# Patient Record
Sex: Male | Born: 1950
Health system: Southern US, Community
[De-identification: ages and names within clinical notes are randomized; demographics above are authoritative.]

## PROBLEM LIST (undated history)

## (undated) DIAGNOSIS — J189 Pneumonia, unspecified organism: Secondary | ICD-10-CM

## (undated) DIAGNOSIS — K219 Gastro-esophageal reflux disease without esophagitis: Secondary | ICD-10-CM

## (undated) DIAGNOSIS — J439 Emphysema, unspecified: Secondary | ICD-10-CM

## (undated) DIAGNOSIS — D369 Benign neoplasm, unspecified site: Secondary | ICD-10-CM

## (undated) DIAGNOSIS — J449 Chronic obstructive pulmonary disease, unspecified: Secondary | ICD-10-CM

## (undated) DIAGNOSIS — I10 Essential (primary) hypertension: Secondary | ICD-10-CM

## (undated) DIAGNOSIS — R918 Other nonspecific abnormal finding of lung field: Secondary | ICD-10-CM

## (undated) DIAGNOSIS — Z720 Tobacco use: Secondary | ICD-10-CM

## (undated) DIAGNOSIS — R9431 Abnormal electrocardiogram [ECG] [EKG]: Secondary | ICD-10-CM

## (undated) DIAGNOSIS — E785 Hyperlipidemia, unspecified: Secondary | ICD-10-CM

## (undated) DIAGNOSIS — R0789 Other chest pain: Secondary | ICD-10-CM

## (undated) DIAGNOSIS — J209 Acute bronchitis, unspecified: Secondary | ICD-10-CM

## (undated) DIAGNOSIS — K429 Umbilical hernia without obstruction or gangrene: Secondary | ICD-10-CM

## (undated) DIAGNOSIS — D72829 Elevated white blood cell count, unspecified: Secondary | ICD-10-CM

## (undated) DIAGNOSIS — E119 Type 2 diabetes mellitus without complications: Secondary | ICD-10-CM

## (undated) DIAGNOSIS — A419 Sepsis, unspecified organism: Secondary | ICD-10-CM

## (undated) HISTORY — DX: Sepsis, unspecified organism: A41.9

## (undated) HISTORY — DX: Elevated white blood cell count, unspecified: D72.829

## (undated) HISTORY — PX: HERNIA REPAIR: SHX51

## (undated) HISTORY — DX: Chronic obstructive pulmonary disease, unspecified: J44.9

## (undated) HISTORY — DX: Hyperlipidemia, unspecified: E78.5

## (undated) HISTORY — DX: Emphysema, unspecified: J43.9

## (undated) HISTORY — PX: OTHER SURGICAL HISTORY: SHX169

## (undated) HISTORY — DX: Tobacco use: Z72.0

## (undated) HISTORY — DX: Other nonspecific abnormal finding of lung field: R91.8

## (undated) HISTORY — DX: Acute bronchitis, unspecified: J20.9

## (undated) HISTORY — PX: APPENDECTOMY: SHX54

## (undated) HISTORY — PX: TONSILLECTOMY: SUR1361

## (undated) HISTORY — PX: HEMORRHOIDECTOMY WITH HEMORRHOID BANDING: SHX5633

## (undated) HISTORY — DX: Essential (primary) hypertension: I10

## (undated) HISTORY — DX: Other chest pain: R07.89

## (undated) HISTORY — DX: Gastro-esophageal reflux disease without esophagitis: K21.9

## (undated) HISTORY — DX: Abnormal electrocardiogram (ECG) (EKG): R94.31

## (undated) HISTORY — DX: Benign neoplasm, unspecified site: D36.9

## (undated) HISTORY — DX: Type 2 diabetes mellitus without complications: E11.9

## (undated) HISTORY — DX: Umbilical hernia without obstruction or gangrene: K42.9

---

## 2014-01-18 ENCOUNTER — Encounter: Payer: Self-pay | Admitting: Emergency Medicine

## 2014-01-18 ENCOUNTER — Ambulatory Visit (INDEPENDENT_AMBULATORY_CARE_PROVIDER_SITE_OTHER): Payer: BC Managed Care – PPO | Admitting: Emergency Medicine

## 2014-01-18 VITALS — BP 132/84 | HR 104 | Temp 97.1°F | Ht 73.0 in | Wt 317.0 lb

## 2014-01-18 DIAGNOSIS — R911 Solitary pulmonary nodule: Secondary | ICD-10-CM

## 2014-01-18 DIAGNOSIS — F17200 Nicotine dependence, unspecified, uncomplicated: Secondary | ICD-10-CM | POA: Insufficient documentation

## 2014-01-18 NOTE — Assessment & Plan Note (Signed)
64mm calcified nodule. No further eval indicated for this even in a smoker, but I do believe he needs to be screened given his tobacco use. Will order dedicated CT chest

## 2014-01-18 NOTE — Patient Instructions (Signed)
We will perform full pulmonary function testing We will perform a CT scan of your chest  You have agreed to cut your cigarettes down to 1/2 a pack a day by next visit.  Follow with Dr Lamonte Sakai in 1 month

## 2014-01-18 NOTE — Progress Notes (Signed)
   Subjective:    Patient ID: Manuel Leonard, male    DOB: Leonard 05, 1952, 63 y.o.   MRN: [id number]  HPI 63 yo smoker (40 pk-yrs), hx HTN, GERD, COPD, renal cysts and hematuria. Was discovered to have a 2-66mm LLL nodule, probably calcified.   He has cough, happens daily. He has GERD that is only moderately controlled Able to work and exert. He does not use symbicort regularly.    Review of Systems  Constitutional: Negative for fever and unexpected weight change.  HENT: Positive for sneezing and sore throat. Negative for congestion, dental problem, ear pain, nosebleeds, postnasal drip, rhinorrhea, sinus pressure and trouble swallowing.   Eyes: Negative for redness and itching.  Respiratory: Positive for cough. Negative for chest tightness, shortness of breath and wheezing.   Cardiovascular: Positive for chest pain. Negative for palpitations and leg swelling.  Gastrointestinal: Negative for nausea and vomiting.  Genitourinary: Negative for dysuria.  Musculoskeletal: Negative for joint swelling.  Skin: Positive for rash.  Neurological: Negative for headaches.  Hematological: Does not bruise/bleed easily.  Psychiatric/Behavioral: Negative for dysphoric mood. The patient is nervous/anxious.    Past Medical History  Diagnosis Date  . Emphysema/COPD      Family History  Problem Relation Age of Onset  . Lung cancer Brother   . Stomach cancer Brother   . Cancer Father      History   Social History  . Marital Status: Married    Spouse Name: N/A    Number of Children: N/A  . Years of Education: N/A   Occupational History  . Restaurant owner    Social History Main Topics  . Smoking status: Current Every Day Smoker -- 1.00 packs/day for 42 years    Types: Cigarettes  . Smokeless tobacco: Not on file  . Alcohol Use: No  . Drug Use: No  . Sexual Activity: Not on file   Other Topics Concern  . Not on file   Social History Narrative  . No narrative on file  Never exposed  to TB Originally from [place], has lived here for 61 yrs.   Not on File   No outpatient prescriptions prior to visit.   No facility-administered medications prior to visit.         Objective:   Physical Exam Filed Vitals:   01/18/14 1519  BP: 132/84  Pulse: 104  Temp: 97.1 F (36.2 C)  TempSrc: Oral  Height: 6\' 1"  (1.854 m)  Weight: 317 lb (143.79 kg)  SpO2: 97%   Gen: Pleasant, obese, in no distress,  normal affect  ENT: No lesions,  mouth clear,  oropharynx clear, no postnasal drip  Neck: No JVD, no TMG, no carotid bruits  Lungs: No use of accessory muscles, clear without rales or rhonchi  Cardiovascular: RRR, heart sounds normal, no murmur or gallops, no peripheral edema  Musculoskeletal: No deformities, no cyanosis or clubbing  Neuro: alert, non focal  Skin: Warm, no lesions or rashes       Assessment & Plan:  Pulmonary nodule 84mm calcified nodule. No further eval indicated for this even in a smoker, but I do believe he needs to be screened given his tobacco use. Will order dedicated CT chest  Tobacco use disorder Discussed cessation. He agrees to be down to 1/2 pk/day by next month Will arrange for PFT rov 1

## 2014-01-18 NOTE — Assessment & Plan Note (Signed)
Discussed cessation. He agrees to be down to 1/2 pk/day by next month Will arrange for PFT rov 1

## 2014-01-26 ENCOUNTER — Ambulatory Visit (INDEPENDENT_AMBULATORY_CARE_PROVIDER_SITE_OTHER)
Admission: RE | Admit: 2014-01-26 | Discharge: 2014-01-26 | Disposition: A | Discharge status: Home / Self Care | Payer: BC Managed Care – PPO | Source: Ambulatory Visit | Attending: Emergency Medicine | Admitting: Emergency Medicine

## 2014-01-26 ENCOUNTER — Ambulatory Visit (HOSPITAL_COMMUNITY)
Admission: RE | Admit: 2014-01-26 | Discharge: 2014-01-26 | Disposition: A | Discharge status: Home / Self Care | Payer: BC Managed Care – PPO | Source: Ambulatory Visit | Attending: Emergency Medicine | Admitting: Emergency Medicine

## 2014-01-26 DIAGNOSIS — R911 Solitary pulmonary nodule: Secondary | ICD-10-CM

## 2014-01-26 DIAGNOSIS — F17200 Nicotine dependence, unspecified, uncomplicated: Secondary | ICD-10-CM | POA: Insufficient documentation

## 2014-01-26 DIAGNOSIS — J988 Other specified respiratory disorders: Secondary | ICD-10-CM | POA: Insufficient documentation

## 2014-01-26 MED ORDER — ALBUTEROL SULFATE (2.5 MG/3ML) 0.083% IN NEBU
2.5000 mg | INHALATION_SOLUTION | Freq: Once | RESPIRATORY_TRACT | Status: AC
  Administered 2014-01-26: 2.5 mg via RESPIRATORY_TRACT

## 2014-01-30 ENCOUNTER — Telehealth: Payer: Self-pay | Admitting: Emergency Medicine

## 2014-01-30 NOTE — Telephone Encounter (Signed)
Please let him know that his CT chest looks good - the only pulmonary nodule is the small one that we already knew about from his abdominal CT. There is an area of mild inflammation that can be seen in one of his lungs that does not look like a nodule. He will need to have a repeat CT scan in approximately 1 year to make sure nothing changes.

## 2014-01-30 NOTE — Telephone Encounter (Signed)
Pt asking for CT results from 01/26/14. Please advise RB thanks

## 2014-01-31 LAB — PULMONARY FUNCTION TEST
DL/VA % pred: 107 %
DL/VA: 4.96 ml/min/mmHg/L
DLCO UNC % PRED: 79 %
DLCO unc: 25.61 ml/min/mmHg
FEF 25-75 POST: 1.65 L/s
FEF 25-75 Pre: 0.82 L/sec
FEF2575-%Change-Post: 100 %
FEF2575-%PRED-POST: 57 %
FEF2575-%Pred-Pre: 28 %
FEV1-%Change-Post: 23 %
FEV1-%PRED-POST: 61 %
FEV1-%Pred-Pre: 49 %
FEV1-POST: 2.16 L
FEV1-PRE: 1.74 L
FEV1FVC-%Change-Post: 2 %
FEV1FVC-%PRED-PRE: 84 %
FEV6-%CHANGE-POST: 21 %
FEV6-%PRED-PRE: 59 %
FEV6-%Pred-Post: 72 %
FEV6-POST: 3.23 L
FEV6-Pre: 2.66 L
FEV6FVC-%Change-Post: 0 %
FEV6FVC-%Pred-Post: 103 %
FEV6FVC-%Pred-Pre: 102 %
FVC-%Change-Post: 20 %
FVC-%PRED-POST: 70 %
FVC-%PRED-PRE: 58 %
FVC-PRE: 2.74 L
FVC-Post: 3.3 L
PRE FEV1/FVC RATIO: 64 %
Post FEV1/FVC ratio: 65 %
Post FEV6/FVC ratio: 98 %
Pre FEV6/FVC Ratio: 97 %
RV % pred: 171 %
RV: 3.92 L
TLC % pred: 102 %
TLC: 7.17 L

## 2014-01-31 NOTE — Telephone Encounter (Signed)
ATC PT NA unable to leave VM WCB 

## 2014-01-31 NOTE — Telephone Encounter (Signed)
Spoke with pt and notified of results per Dr. Byrum. Pt verbalized understanding and denied any questions.  

## 2014-02-28 ENCOUNTER — Ambulatory Visit: Payer: BC Managed Care – PPO | Admitting: Emergency Medicine

## 2014-03-02 ENCOUNTER — Ambulatory Visit: Payer: BC Managed Care – PPO | Admitting: Emergency Medicine

## 2014-07-14 IMAGING — CT CT CHEST W/O CM
2 of 4 series · 15 of 36 positions shown, 18 images · IV contrast (Omnipaque 300)
Comparison: None.

CLINICAL DATA: Follow-up pulmonary nodule

EXAM:
CT CHEST WITHOUT CONTRAST
TECHNIQUE: Multidetector CT imaging of the chest was performed following the
standard protocol without IV contrast..

[Series 2: chest routine with · axial · 0.90mm/px · z∈[-286,-26]mm · 12 of 62 slices shown, 15 images]
[im 5/62  mediastinal]
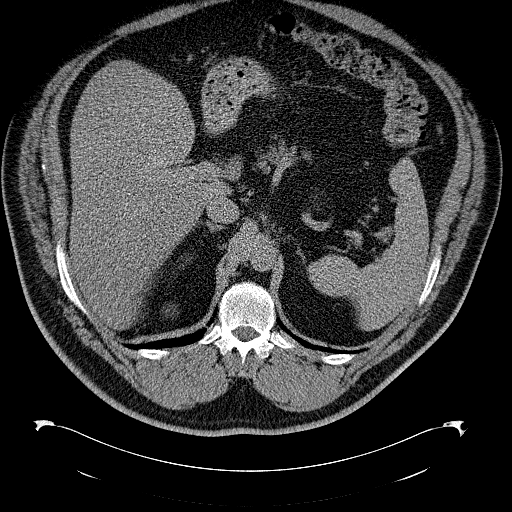
[im 5/62  lung]
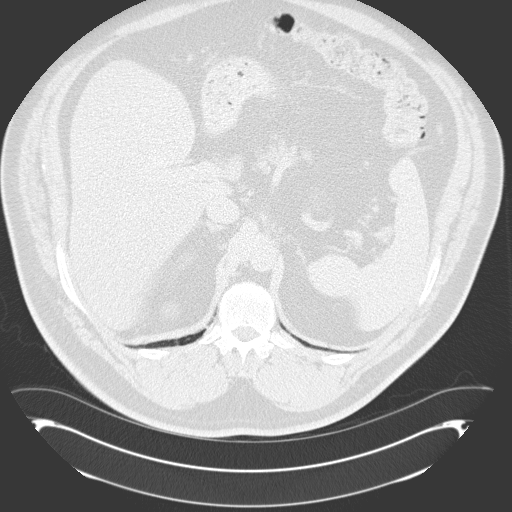
[im 10/62  lung]
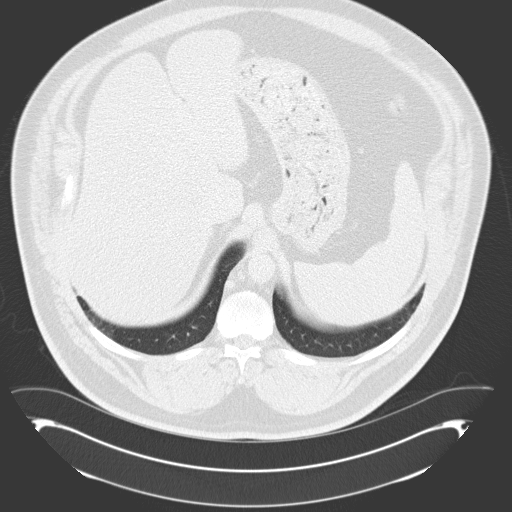
[im 15/62  lung]
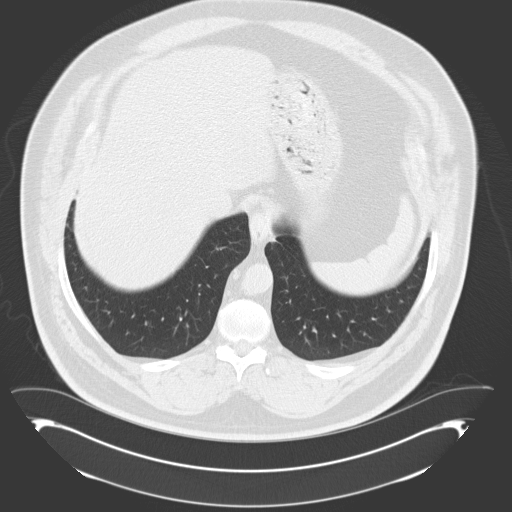
[im 19/62  lung]
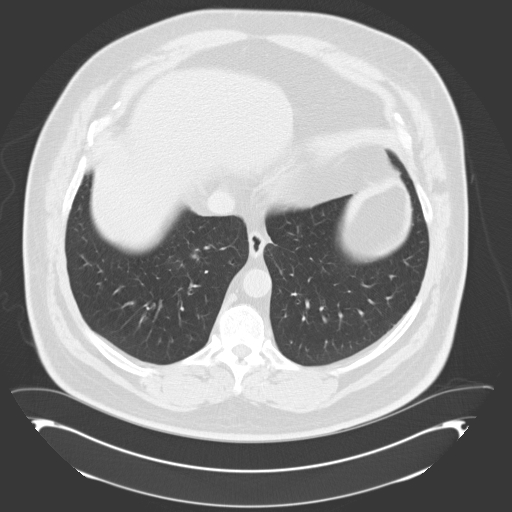
[im 24/62  mediastinal]
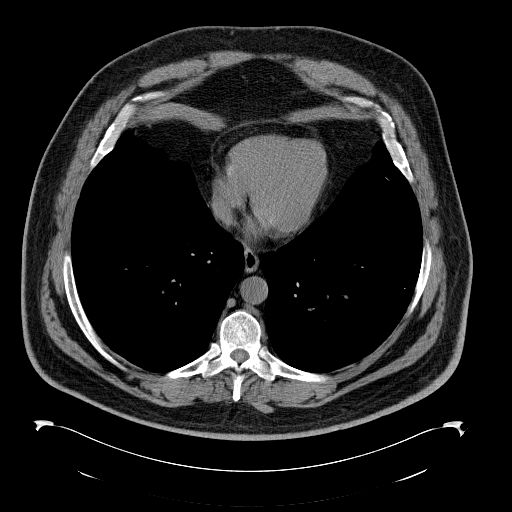
[im 24/62  lung]
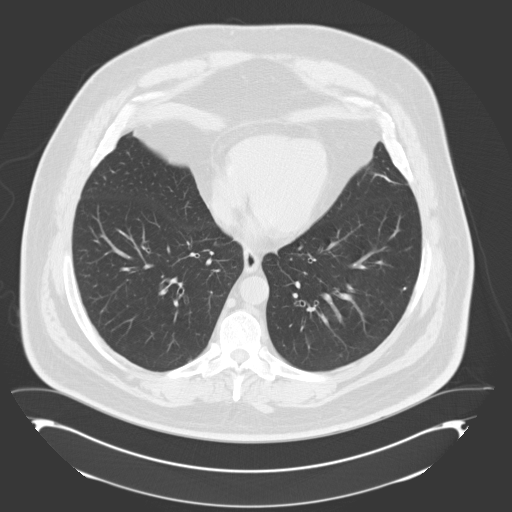
[im 29/62  lung]
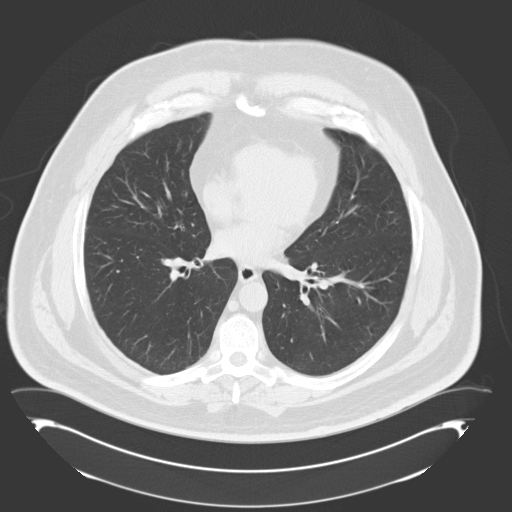
[im 33/62  lung]
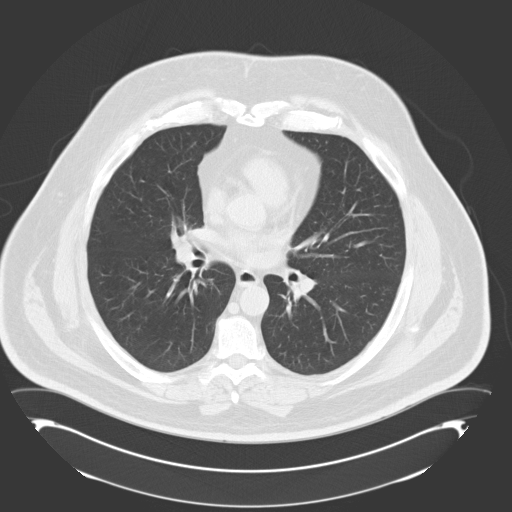
[im 38/62  lung]
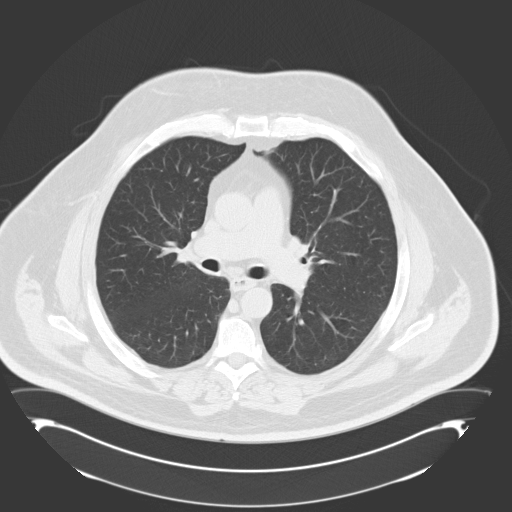
[im 43/62  mediastinal]
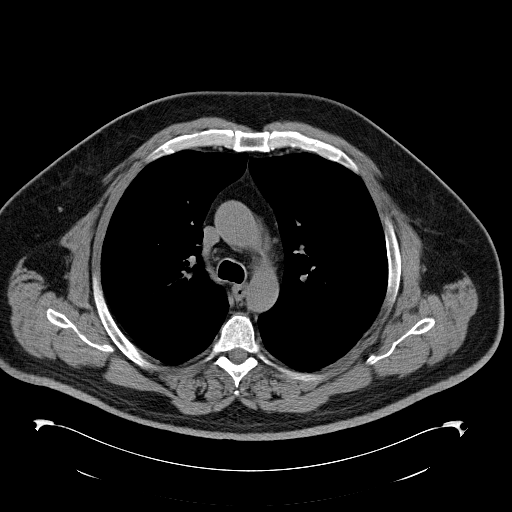
[im 43/62  lung]
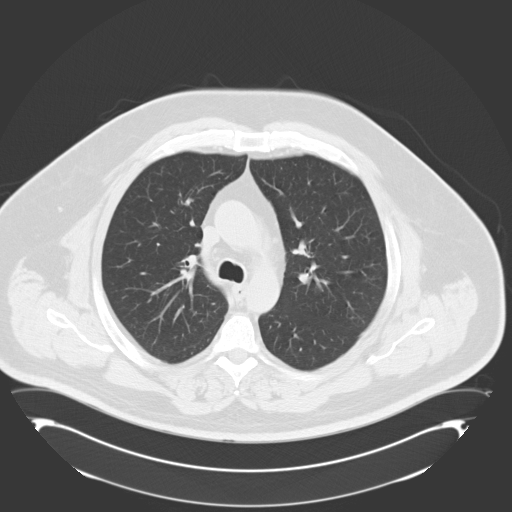
[im 47/62  lung]
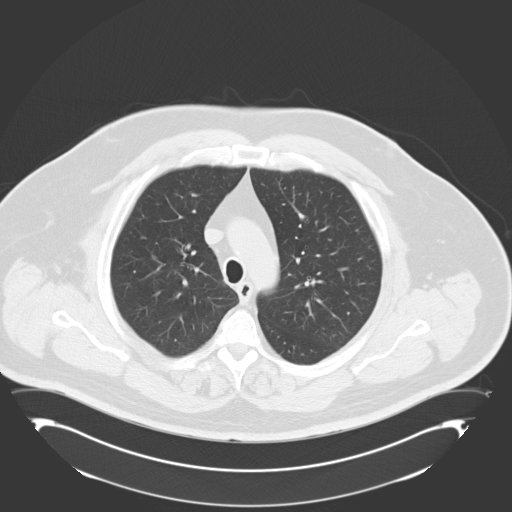
[im 52/62  lung]
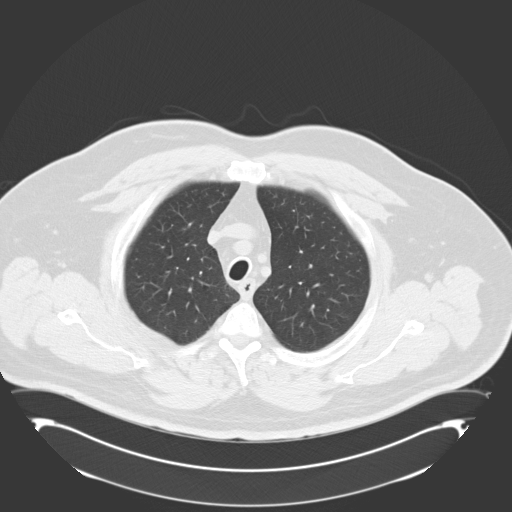
[im 57/62  lung]
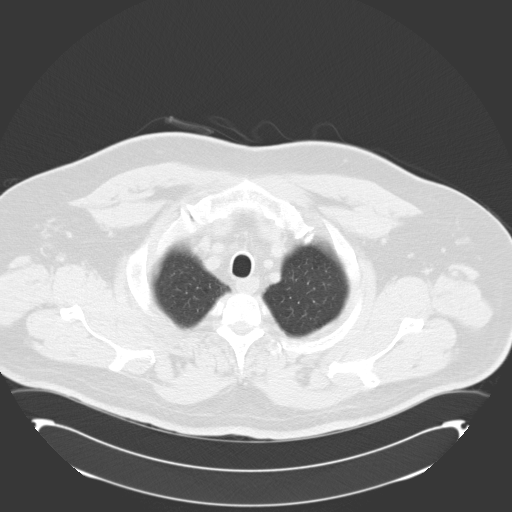

[Series 602: cor · coronal · 0.90mm/px · 3 of 134 slices shown]
[im 27/134  lung]
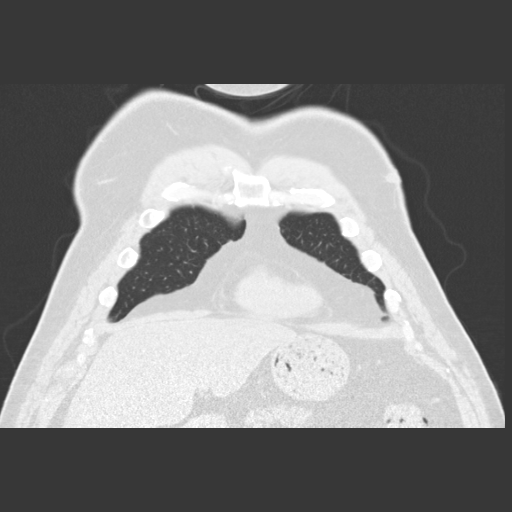
[im 54/134  lung]
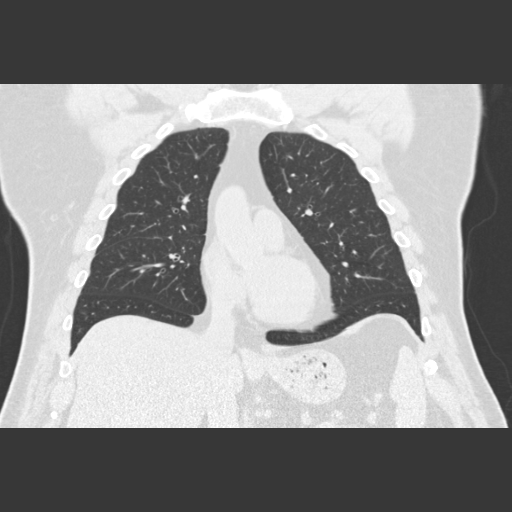
[im 80/134  lung]
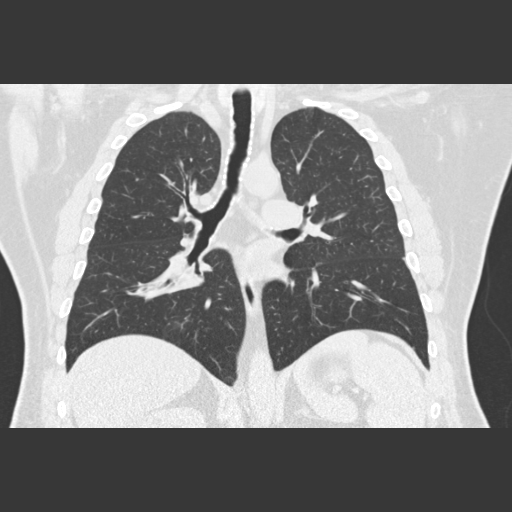

[15 of 36 positions shown; findings below may reference images not displayed]

FINDINGS: 2 mm calcified granuloma in the left lower lobe (series 3/image 39),
benign.

Mild clustered ground-glass nodularity measuring up to 3-4 mm in the
medial right lung base (series 3/ image 43), likely
infectious/inflammatory.

No suspicious pulmonary nodules. Mild paraseptal emphysematous
changes. No pleural effusion or pneumothorax.

Visualized thyroid is unremarkable.

The heart is normal in size. No pericardial effusion. Mild
atherosclerotic calcifications of the ascending aortic arch.

No suspicious mediastinal, hilar, or axillary lymphadenopathy.

Visualized upper abdomen is notable for mild hepatic steatosis right
upper pole renal cysts, incompletely visualized.

Degenerative changes of the visualized thoracolumbar spine.
IMPRESSION: 2 mm left lower lobe calcified granuloma, benign.

Mild clustered ground-glass nodularity in the medial right lung
base, measuring up to 3-4 mm, likely infectious/inflammatory.

Given patient's high risk for primary bronchogenic neoplasm, a
single follow-up CT chest could be performed in 1 year if clinically
warranted.

This recommendation follows the consensus statement: Guidelines for
Management of Small Pulmonary Nodules Detected on CT Scans: A
Statement from the [HOSPITAL] as published in Radiology

## 2014-09-20 ENCOUNTER — Other Ambulatory Visit (HOSPITAL_COMMUNITY): Payer: Self-pay | Admitting: Family Medicine

## 2014-09-20 DIAGNOSIS — R0600 Dyspnea, unspecified: Secondary | ICD-10-CM

## 2014-09-21 ENCOUNTER — Ambulatory Visit (HOSPITAL_COMMUNITY)
Admission: RE | Admit: 2014-09-21 | Discharge: 2014-09-21 | Disposition: A | Discharge status: Home / Self Care | Payer: BLUE CROSS/BLUE SHIELD | Source: Ambulatory Visit | Attending: Family Medicine | Admitting: Family Medicine

## 2014-09-21 DIAGNOSIS — R0600 Dyspnea, unspecified: Secondary | ICD-10-CM

## 2014-09-21 DIAGNOSIS — R05 Cough: Secondary | ICD-10-CM | POA: Insufficient documentation

## 2014-09-21 DIAGNOSIS — R0602 Shortness of breath: Secondary | ICD-10-CM | POA: Diagnosis not present

## 2014-09-21 DIAGNOSIS — Z87891 Personal history of nicotine dependence: Secondary | ICD-10-CM | POA: Diagnosis not present

## 2014-09-21 MED ORDER — IOHEXOL 350 MG/ML SOLN
100.0000 mL | Freq: Once | INTRAVENOUS | Status: AC | PRN
  Administered 2014-09-21: 100 mL via INTRAVENOUS

## 2014-09-27 ENCOUNTER — Emergency Department (HOSPITAL_BASED_OUTPATIENT_CLINIC_OR_DEPARTMENT_OTHER): Payer: BLUE CROSS/BLUE SHIELD

## 2014-09-27 ENCOUNTER — Inpatient Hospital Stay (HOSPITAL_BASED_OUTPATIENT_CLINIC_OR_DEPARTMENT_OTHER)
Admission: EM | Admit: 2014-09-27 | Discharge: 2014-10-02 | DRG: 871 | Disposition: A | Discharge status: Home / Self Care | Payer: BLUE CROSS/BLUE SHIELD | Attending: Internal Medicine | Admitting: Internal Medicine

## 2014-09-27 ENCOUNTER — Encounter (HOSPITAL_COMMUNITY): Payer: Self-pay | Admitting: *Deleted

## 2014-09-27 DIAGNOSIS — Z6841 Body Mass Index (BMI) 40.0 and over, adult: Secondary | ICD-10-CM | POA: Diagnosis not present

## 2014-09-27 DIAGNOSIS — K219 Gastro-esophageal reflux disease without esophagitis: Secondary | ICD-10-CM | POA: Diagnosis present

## 2014-09-27 DIAGNOSIS — F17210 Nicotine dependence, cigarettes, uncomplicated: Secondary | ICD-10-CM | POA: Diagnosis present

## 2014-09-27 DIAGNOSIS — G4733 Obstructive sleep apnea (adult) (pediatric): Secondary | ICD-10-CM | POA: Diagnosis present

## 2014-09-27 DIAGNOSIS — Z9981 Dependence on supplemental oxygen: Secondary | ICD-10-CM | POA: Diagnosis not present

## 2014-09-27 DIAGNOSIS — R0602 Shortness of breath: Secondary | ICD-10-CM | POA: Diagnosis not present

## 2014-09-27 DIAGNOSIS — Z7951 Long term (current) use of inhaled steroids: Secondary | ICD-10-CM

## 2014-09-27 DIAGNOSIS — J441 Chronic obstructive pulmonary disease with (acute) exacerbation: Secondary | ICD-10-CM | POA: Insufficient documentation

## 2014-09-27 DIAGNOSIS — E039 Hypothyroidism, unspecified: Secondary | ICD-10-CM | POA: Diagnosis present

## 2014-09-27 DIAGNOSIS — J189 Pneumonia, unspecified organism: Secondary | ICD-10-CM | POA: Diagnosis present

## 2014-09-27 DIAGNOSIS — E785 Hyperlipidemia, unspecified: Secondary | ICD-10-CM | POA: Diagnosis present

## 2014-09-27 DIAGNOSIS — N179 Acute kidney failure, unspecified: Secondary | ICD-10-CM | POA: Diagnosis present

## 2014-09-27 DIAGNOSIS — M542 Cervicalgia: Secondary | ICD-10-CM

## 2014-09-27 DIAGNOSIS — Z800 Family history of malignant neoplasm of digestive organs: Secondary | ICD-10-CM | POA: Diagnosis not present

## 2014-09-27 DIAGNOSIS — Z7982 Long term (current) use of aspirin: Secondary | ICD-10-CM | POA: Diagnosis not present

## 2014-09-27 DIAGNOSIS — J9601 Acute respiratory failure with hypoxia: Secondary | ICD-10-CM | POA: Diagnosis present

## 2014-09-27 DIAGNOSIS — A419 Sepsis, unspecified organism: Principal | ICD-10-CM | POA: Diagnosis present

## 2014-09-27 DIAGNOSIS — D72829 Elevated white blood cell count, unspecified: Secondary | ICD-10-CM

## 2014-09-27 DIAGNOSIS — J449 Chronic obstructive pulmonary disease, unspecified: Secondary | ICD-10-CM | POA: Diagnosis present

## 2014-09-27 DIAGNOSIS — J019 Acute sinusitis, unspecified: Secondary | ICD-10-CM | POA: Diagnosis present

## 2014-09-27 DIAGNOSIS — J9602 Acute respiratory failure with hypercapnia: Secondary | ICD-10-CM | POA: Diagnosis present

## 2014-09-27 DIAGNOSIS — R918 Other nonspecific abnormal finding of lung field: Secondary | ICD-10-CM | POA: Insufficient documentation

## 2014-09-27 DIAGNOSIS — I10 Essential (primary) hypertension: Secondary | ICD-10-CM | POA: Diagnosis present

## 2014-09-27 DIAGNOSIS — Z801 Family history of malignant neoplasm of trachea, bronchus and lung: Secondary | ICD-10-CM | POA: Diagnosis not present

## 2014-09-27 HISTORY — DX: Elevated white blood cell count, unspecified: D72.829

## 2014-09-27 HISTORY — DX: Sepsis, unspecified organism: A41.9

## 2014-09-27 LAB — BASIC METABOLIC PANEL
Anion gap: 6 (ref 5–15)
BUN: 18 mg/dL (ref 6–23)
CALCIUM: 8.6 mg/dL (ref 8.4–10.5)
CHLORIDE: 99 mmol/L (ref 96–112)
CO2: 32 mmol/L (ref 19–32)
Creatinine, Ser: 1 mg/dL (ref 0.50–1.35)
GFR calc Af Amer: >90 mL/min (ref >90)
GFR calc non Af Amer: 78 mL/min — ABNORMAL LOW (ref >90)
Glucose, Bld: 159 mg/dL — ABNORMAL HIGH (ref 70–99)
Potassium: 3.8 mmol/L (ref 3.5–5.1)
Sodium: 137 mmol/L (ref 135–145)

## 2014-09-27 LAB — CBC WITH DIFFERENTIAL/PLATELET
BAND NEUTROPHILS: 1 % (ref 0–10)
BASOS ABS: 0.1 10*3/uL (ref 0.0–0.1)
Basophils Relative: 1 % (ref 0–1)
Eosinophils Absolute: 0 10*3/uL (ref 0.0–0.7)
Eosinophils Relative: 0 % (ref 0–5)
HEMATOCRIT: 45.5 % (ref 39.0–52.0)
HEMOGLOBIN: 14.8 g/dL (ref 13.0–17.0)
Lymphocytes Relative: 25 % (ref 12–46)
Lymphs Abs: 3.2 10*3/uL (ref 0.7–4.0)
MCH: 29.8 pg (ref 26.0–34.0)
MCHC: 32.5 g/dL (ref 30.0–36.0)
MCV: 91.5 fL (ref 78.0–100.0)
Metamyelocytes Relative: 1 %
Monocytes Absolute: 1.5 10*3/uL — ABNORMAL HIGH (ref 0.1–1.0)
Monocytes Relative: 12 % (ref 3–12)
Neutro Abs: 8.1 10*3/uL — ABNORMAL HIGH (ref 1.7–7.7)
Neutrophils Relative %: 60 % (ref 43–77)
PLATELETS: 282 10*3/uL (ref 150–400)
RBC: 4.97 MIL/uL (ref 4.22–5.81)
RDW: 14.8 % (ref 11.5–15.5)
WBC: 12.9 10*3/uL — AB (ref 4.0–10.5)

## 2014-09-27 LAB — EXPECTORATED SPUTUM ASSESSMENT W REFEX TO RESP CULTURE

## 2014-09-27 LAB — EXPECTORATED SPUTUM ASSESSMENT W GRAM STAIN, RFLX TO RESP C

## 2014-09-27 LAB — TROPONIN I: Troponin I: <0.03 ng/mL (ref <0.031)

## 2014-09-27 LAB — BRAIN NATRIURETIC PEPTIDE: B NATRIURETIC PEPTIDE 5: 55.1 pg/mL (ref 0.0–100.0)

## 2014-09-27 LAB — TSH: TSH: 6.699 u[IU]/mL — ABNORMAL HIGH (ref 0.350–4.500)

## 2014-09-27 MED ORDER — ACETAMINOPHEN 325 MG PO TABS: 650.0000 mg | ORAL_TABLET | Freq: Four times a day (QID) | ORAL | Status: DC | PRN

## 2014-09-27 MED ORDER — IPRATROPIUM-ALBUTEROL 0.5-2.5 (3) MG/3ML IN SOLN
3.0000 mL | Freq: Four times a day (QID) | RESPIRATORY_TRACT | Status: DC
  Administered 2014-09-28: 3 mL via RESPIRATORY_TRACT
  Filled 2014-09-27: qty 3

## 2014-09-27 MED ORDER — VANCOMYCIN HCL IN DEXTROSE 1-5 GM/200ML-% IV SOLN
1000.0000 mg | Freq: Once | INTRAVENOUS | Status: AC
  Administered 2014-09-27: 1000 mg via INTRAVENOUS
  Filled 2014-09-27: qty 200

## 2014-09-27 MED ORDER — ONDANSETRON HCL 4 MG/2ML IJ SOLN: 4.0000 mg | Freq: Four times a day (QID) | INTRAMUSCULAR | Status: DC | PRN

## 2014-09-27 MED ORDER — OLOPATADINE HCL 0.1 % OP SOLN: 1.0000 [drp] | Freq: Two times a day (BID) | OPHTHALMIC | Status: DC

## 2014-09-27 MED ORDER — IPRATROPIUM-ALBUTEROL 0.5-2.5 (3) MG/3ML IN SOLN
3.0000 mL | Freq: Once | RESPIRATORY_TRACT | Status: AC
  Administered 2014-09-27: 3 mL via RESPIRATORY_TRACT
  Filled 2014-09-27: qty 3

## 2014-09-27 MED ORDER — PIPERACILLIN-TAZOBACTAM 3.375 G IVPB
INTRAVENOUS | Status: AC
  Filled 2014-09-27: qty 50

## 2014-09-27 MED ORDER — SODIUM CHLORIDE 0.9 % IV SOLN
1500.0000 mg | Freq: Once | INTRAVENOUS | Status: AC
  Administered 2014-09-27: 1500 mg via INTRAVENOUS
  Filled 2014-09-27: qty 1500

## 2014-09-27 MED ORDER — ALBUTEROL SULFATE (2.5 MG/3ML) 0.083% IN NEBU: 2.5000 mg | INHALATION_SOLUTION | RESPIRATORY_TRACT | Status: DC

## 2014-09-27 MED ORDER — PREDNISONE 50 MG PO TABS
60.0000 mg | ORAL_TABLET | Freq: Once | ORAL | Status: AC
  Administered 2014-09-27: 60 mg via ORAL
  Filled 2014-09-27 (×2): qty 1

## 2014-09-27 MED ORDER — SODIUM CHLORIDE 0.9 % IV SOLN
INTRAVENOUS | Status: AC
  Administered 2014-09-27: 18:00:00 via INTRAVENOUS

## 2014-09-27 MED ORDER — PRAVASTATIN SODIUM 20 MG PO TABS: 20.0000 mg | ORAL_TABLET | Freq: Every day | ORAL | Status: DC

## 2014-09-27 MED ORDER — ASPIRIN EC 81 MG PO TBEC
81.0000 mg | DELAYED_RELEASE_TABLET | Freq: Every day | ORAL | Status: DC
  Administered 2014-09-28 – 2014-10-02 (×5): 81 mg via ORAL
  Filled 2014-09-27 (×5): qty 1

## 2014-09-27 MED ORDER — VANCOMYCIN HCL IN DEXTROSE 1-5 GM/200ML-% IV SOLN
1000.0000 mg | Freq: Two times a day (BID) | INTRAVENOUS | Status: DC
  Administered 2014-09-28: 1000 mg via INTRAVENOUS
  Filled 2014-09-27: qty 200

## 2014-09-27 MED ORDER — METHYLPREDNISOLONE SODIUM SUCC 125 MG IJ SOLR
60.0000 mg | Freq: Two times a day (BID) | INTRAMUSCULAR | Status: DC
  Administered 2014-09-27 – 2014-09-30 (×6): 60 mg via INTRAVENOUS
  Filled 2014-09-27 (×8): qty 0.96

## 2014-09-27 MED ORDER — IPRATROPIUM-ALBUTEROL 0.5-2.5 (3) MG/3ML IN SOLN
3.0000 mL | RESPIRATORY_TRACT | Status: DC
  Administered 2014-09-27: 3 mL via RESPIRATORY_TRACT
  Filled 2014-09-27: qty 3

## 2014-09-27 MED ORDER — VANCOMYCIN HCL IN DEXTROSE 1-5 GM/200ML-% IV SOLN
1000.0000 mg | Freq: Once | INTRAVENOUS | Status: DC
  Filled 2014-09-27: qty 200

## 2014-09-27 MED ORDER — PIPERACILLIN-TAZOBACTAM 3.375 G IVPB
3.3750 g | Freq: Three times a day (TID) | INTRAVENOUS | Status: DC
  Administered 2014-09-27 – 2014-10-01 (×14): 3.375 g via INTRAVENOUS
  Filled 2014-09-27 (×15): qty 50

## 2014-09-27 MED ORDER — AZITHROMYCIN 500 MG IV SOLR: 500.0000 mg | INTRAVENOUS | Status: DC

## 2014-09-27 MED ORDER — ASPIRIN 162 MG PO TBEC: 100.0000 mg | DELAYED_RELEASE_TABLET | Freq: Every day | ORAL | Status: DC

## 2014-09-27 MED ORDER — IPRATROPIUM-ALBUTEROL 0.5-2.5 (3) MG/3ML IN SOLN
3.0000 mL | RESPIRATORY_TRACT | Status: DC | PRN
  Administered 2014-09-27: 3 mL via RESPIRATORY_TRACT
  Filled 2014-09-27: qty 3

## 2014-09-27 MED ORDER — PIPERACILLIN-TAZOBACTAM IN DEX 2-0.25 GM/50ML IV SOLN
2.2500 g | Freq: Once | INTRAVENOUS | Status: AC
  Administered 2014-09-27: 2.25 g via INTRAVENOUS
  Filled 2014-09-27: qty 50

## 2014-09-27 MED ORDER — GUAIFENESIN-DM 100-10 MG/5ML PO SYRP
5.0000 mL | ORAL_SOLUTION | ORAL | Status: DC | PRN
  Administered 2014-09-27 – 2014-09-28 (×2): 5 mL via ORAL
  Filled 2014-09-27 (×2): qty 10

## 2014-09-27 MED ORDER — ACETAMINOPHEN 650 MG RE SUPP: 650.0000 mg | Freq: Four times a day (QID) | RECTAL | Status: DC | PRN

## 2014-09-27 MED ORDER — CEFTRIAXONE SODIUM IN DEXTROSE 20 MG/ML IV SOLN: 1.0000 g | INTRAVENOUS | Status: DC

## 2014-09-27 MED ORDER — ONDANSETRON HCL 4 MG PO TABS: 4.0000 mg | ORAL_TABLET | Freq: Four times a day (QID) | ORAL | Status: DC | PRN

## 2014-09-27 NOTE — ED Notes (Signed)
MD at bedside. 

## 2014-09-27 NOTE — Progress Notes (Addendum)
Pt needs transfer to [facility] for treatment for shortness of breath, COPD, possible pneumonia. Azithromycin and rocpehin to be given in ED Med floor requested   Leisa Lenz [facility] [phone]

## 2014-09-27 NOTE — ED Notes (Signed)
Report called to Marjorie Smolder, [place].

## 2014-09-27 NOTE — H&P (Addendum)
[facility] History and Physical  Manuel Leonard ION:[id number] DOB: Feb 15, 1951 DOA: 09/27/2014  Referring physician: ER physician PCP: Martinique, BETTY G, MD   Chief Complaint: shortness of breath   HPI:  64 year old pleasant male with history of COPD, recent outpatient treatment for possible pneumonia who presented to [place] with worsening shortness of breath, cough productive of clear to yellow sputum, fevers, chills for past few days and progressively worsening in last 24 hours. He reports chest discomfort only with staining when coughing otherwise no chest pain. No palpitations. No complaints of abdominal pain, nausea or vomiting. No diarrhea or constipation. No urinary complaints. No lightheadedness or loss of consciousness.   Pt was transferred from [place] center. His BP on admission 107/65, HR 112, RR 24-40, T max 99.6 F and oxygen saturation 89% on room air. Blood work showed leukocytosis of 12.9. CXR showed bibasilar infiltrates. He was started on broad spectrum abx, vanco and zosyn.   Assessment & Plan    Principal Problem: Acute respiratory failure with hypoxia / COPD (chronic obstructive pulmonary disease) - hypoxia on admission likely due to COPD and likely pneumonia - order placed for duoneb scheduled and as needed for shortness of breath or wheezing - added solumedrol 60 mg IV Q 12 hors IV - treat for pneumonia with vanco and zosyn - follow up blood, resp cultures, legionella, strep pneumo results  - oxygen support via [place] to keep O2 sats above 90% - COPD gold alert order placed   Active Problems: Sepsis / CAP (community acquired pneumonia) / Leukocytosis - sepsis criteria met on admission with slight hypotension, tachycardia, tachypnea (low grade fever of 99.6 F but please note pt already was on abx so he may not have spiked fever because he was on abx). On blood work he had leukocytosis and on CXR evidence of bilateral infiltrates concerning for acute  infectious process  - started vanco and zosyn - pneumonia order set in place - follow up blood culture, resp culutre result; follow up strep pneumonia and legionella, influenza, HIV - follow up CBC in am     DVT prophylaxis:  - SCD's bilaterally   Radiological Exams on Admission: Dg Chest 2 View 09/27/2014  Persistent bilateral infiltrates or edema.   Electronically Signed   By: Arne Cleveland M.D.   On: 09/27/2014 13:20    Code Status: Full Family Communication: Plan of care discussed with the patient and his daughter at the bedside  Disposition Plan: Admit for further evaluation  Leisa Lenz, MD  [facility] Pager [phone]  Review of Systems:  Constitutional: Negative for fever, chills and malaise/fatigue. Negative for diaphoresis.  HENT: Negative for hearing loss, ear pain, nosebleeds, congestion, sore throat, neck pain, tinnitus and ear discharge.   Eyes: Negative for blurred vision, double vision, photophobia, pain, discharge and redness.  Respiratory: per HPI   Cardiovascular: Negative for chest pain, palpitations, orthopnea, claudication and leg swelling.  Gastrointestinal: Negative for nausea, vomiting and abdominal pain. Negative for heartburn, constipation, blood in stool and melena.  Genitourinary: Negative for dysuria, urgency, frequency, hematuria and flank pain.  Musculoskeletal: Negative for myalgias, back pain, joint pain and falls.  Skin: Negative for itching and rash.  Neurological: Negative for dizziness and weakness. Negative for tingling, tremors, sensory change, speech change, focal weakness, loss of consciousness and headaches.  Endo/Heme/Allergies: Negative for environmental allergies and polydipsia. Does not bruise/bleed easily.  Psychiatric/Behavioral: Negative for suicidal ideas. The patient is not nervous/anxious.  Past Medical History  Diagnosis Date  . Emphysema/COPD   . Hypertension   . Adenomatous polyps   . COPD (chronic  obstructive pulmonary disease)   . Tobacco abuse   . GERD (gastroesophageal reflux disease)    Past Surgical History  Procedure Laterality Date  . Appendectomy     Social History:  reports that he has been smoking Cigarettes.  He has a 42 pack-year smoking history. He does not have any smokeless tobacco history on file. He reports that he does not drink alcohol or use illicit drugs.  No Known Allergies  Family History:  Family History  Problem Relation Age of Onset  . Lung cancer Brother   . Stomach cancer Brother   . Cancer Father      Prior to Admission medications   Medication Sig Start Date End Date Taking? Authorizing Provider  albuterol (2.5 MG/3ML) 0.083% NEBU 3 mL, albuterol (5 MG/ML) 0.5% NEBU 0.5 mL Inhale into the lungs.   Yes Historical Provider, MD  NON FORMULARY Place under the tongue at bedtime as needed.   Yes Historical Provider, MD  NYSTATIN PO Take by mouth.   Yes Historical Provider, MD  pravastatin (PRAVACHOL) 20 MG tablet Take 20 mg by mouth daily.   Yes Historical Provider, MD  aspirin 162 MG EC tablet Take 100 mg by mouth daily.    Historical Provider, MD  budesonide-formoterol (SYMBICORT) 160-4.5 MCG/ACT inhaler Inhale 2 puffs into the lungs 2 (two) times daily.    Historical Provider, MD  Olopatadine HCl (PATADAY) 0.2 % SOLN Apply to eye.    Historical Provider, MD  omeprazole (PRILOSEC) 40 MG capsule Take 1 capsule by mouth daily. 01/08/14   Historical Provider, MD   Physical Exam: Filed Vitals:   09/27/14 1413 09/27/14 1420 09/27/14 1421 09/27/14 1622  BP:   107/65 127/84  Pulse: 106  104 105  Temp:    99.6 F (37.6 C)  TempSrc:      Resp: 32  24 24  SpO2: 98% 95% 95% 91%    Physical Exam  Constitutional: Appears well-developed and well-nourished. No distress.  HENT: Normocephalic. No tonsillar erythema or exudates Eyes: Conjunctivae and EOM are normal. PERRLA, no scleral icterus.  Neck: Normal ROM. Neck supple. No JVD. No tracheal deviation.  No thyromegaly.  CVS: RRR, S1/S2 +, no murmurs, no gallops, no carotid bruit.  Pulmonary: wheezing in upper lung lobes, rhonchi bilaterally in mid and lower lung lobes Abdominal: Soft. BS +,  no distension, tenderness, rebound or guarding.  Musculoskeletal: Normal range of motion. No edema and no tenderness.  Lymphadenopathy: No lymphadenopathy noted, cervical, inguinal. Neuro: Alert. Normal reflexes, muscle tone coordination. No focal neurologic deficits. Skin: Skin is warm and dry. No rash noted.  No erythema. No pallor.  Psychiatric: Normal mood and affect. Behavior, judgment, thought content normal.   Labs on Admission:  Basic Metabolic Panel:  Recent Labs Lab 09/27/14 1230  NA 137  K 3.8  CL 99  CO2 32  GLUCOSE 159*  BUN 18  CREATININE 1.00  CALCIUM 8.6   Liver Function Tests: No results for input(s): AST, ALT, ALKPHOS, BILITOT, PROT, ALBUMIN in the last 168 hours. No results for input(s): LIPASE, AMYLASE in the last 168 hours. No results for input(s): AMMONIA in the last 168 hours. CBC:  Recent Labs Lab 09/27/14 1230  WBC 12.9*  NEUTROABS 8.1*  HGB 14.8  HCT 45.5  MCV 91.5  PLT 282   Cardiac Enzymes:  Recent Labs Lab 09/27/14  Terry <0.03   BNP: Invalid input(s): POCBNP CBG: No results for input(s): GLUCAP in the last 168 hours.  If 7PM-7AM, please contact night-coverage [email or web address] Password [facility] 09/27/2014, 4:43 PM

## 2014-09-27 NOTE — ED Notes (Signed)
Report called to Unit RN Odette Horns)

## 2014-09-27 NOTE — ED Notes (Signed)
RN unable to accept report at this time; will call back.

## 2014-09-27 NOTE — ED Notes (Signed)
Glendell Docker, FNP at bedside.  Pt informed of admission.

## 2014-09-27 NOTE — Progress Notes (Signed)
ANTIBIOTIC CONSULT NOTE - INITIAL  Pharmacy Consult for Vancomycin and Zosyn Indication: pneumonia  No Known Allergies  Patient Measurements: Height: 6' 0.83" (185 cm) Weight: 296 lb 1.2 oz (134.3 kg) IBW/kg (Calculated) : 79.52  Vital Signs: Temp: 99.6 F (37.6 C) (02/03 1622) Temp Source: Oral (02/03 1214) BP: 127/84 mmHg (02/03 1622) Pulse Rate: 105 (02/03 1622) Intake/Output from previous day:   Intake/Output from this shift:    Labs:  Recent Labs  09/27/14 1230  WBC 12.9*  HGB 14.8  PLT 282  CREATININE 1.00   Estimated Creatinine Clearance: 108.4 mL/min (by C-G formula based on Cr of 1). No results for input(s): VANCOTROUGH, VANCOPEAK, VANCORANDOM, GENTTROUGH, GENTPEAK, GENTRANDOM, TOBRATROUGH, TOBRAPEAK, TOBRARND, AMIKACINPEAK, AMIKACINTROU, AMIKACIN in the last 72 hours.   Microbiology: No results found for this or any previous visit (from the past 720 hour(s)).  Medical History: Past Medical History  Diagnosis Date  . Emphysema/COPD   . Hypertension   . Adenomatous polyps   . COPD (chronic obstructive pulmonary disease)   . Tobacco abuse   . GERD (gastroesophageal reflux disease)     Medications:  Scheduled:  . albuterol  2.5 mg Nebulization Q4H  . ipratropium-albuterol  3 mL Nebulization Q4H  . methylPREDNISolone (SOLU-MEDROL) injection  60 mg Intravenous Q12H  . olopatadine  1 drop Both Eyes BID  . piperacillin-tazobactam (ZOSYN)  IV  3.375 g Intravenous Q8H  . pravastatin  20 mg Oral Daily  . vancomycin  1,500 mg Intravenous Once  . vancomycin  1,000 mg Intravenous Once  . [START ON 09/28/2014] vancomycin  1,000 mg Intravenous Q12H   Infusions:  . sodium chloride     PRN: acetaminophen **OR** acetaminophen, ipratropium-albuterol, ondansetron **OR** ondansetron (ZOFRAN) IV  Assessment: 64 yo male tx from [facility] with SOB x several weeks and hypoxia. Diagnosed with PNA by PCP las week and started on Avelox. CXR shows persistent PNA, therefore,  plan for admission to [facility] consulted to dose vancomycin and zosyn.  2/3 >> Vancomycin >> 2/3 >> Zosyn >>  Tmax: 99.6 WBC: 12.9k Renal: 1.0, CrCl 76 ml/min Normalized , >100 ml/min CG  2/3 blood x 2: sent 2/3 sputum: ordered  Goal of Therapy:  Vancomycin trough level 15-20 mcg/ml  Zosyn dose per indication, renal function  Plan:   Vancomycin 1g + 1.5g IV to = 2.5g total loading dose, then 1g IV q12h Check trough at steady state Zosyn 3.375gm IV q8h (4hr extended infusions) Follow up renal function & cultures, clinical course  Peggyann Juba, PharmD, BCPS Pager: [phone] 09/27/2014,5:40 PM

## 2014-09-27 NOTE — ED Notes (Signed)
Pt reports SOB x 2-3 weeks- chest tightness last week- EDNP Glendell Docker at bedside

## 2014-09-27 NOTE — ED Provider Notes (Signed)
CSN: [id number]     Arrival date & time 09/27/14  1210 History   First MD Initiated Contact with Patient 09/27/14 1213     Chief Complaint  Patient presents with  . Shortness of Breath     (Consider location/radiation/quality/duration/timing/severity/associated sxs/prior Treatment) HPI Comments: Pt states that he has been having sob over the last couple of weeks. Started having tightness in the center of his chest last night. He was seen by his pcp last week and diagnosed with pneumonia and started on inhalers and antibiotics:states have been having sweats at night. No n/v. Pt denies swelling  Patient is a 64 y.o. male presenting with shortness of breath. The history is provided by the patient and a relative. No language interpreter was used.  Shortness of Breath Severity:  Moderate Onset quality:  Gradual Duration:  2 weeks Timing:  Constant Progression:  Worsening Relieved by:  Nothing Worsened by:  Activity Ineffective treatments:  Inhaler Associated symptoms: cough   Associated symptoms: no abdominal pain, no fever and no syncope     Past Medical History  Diagnosis Date  . Emphysema/COPD   . Hypertension   . Adenomatous polyps   . COPD (chronic obstructive pulmonary disease)   . Tobacco abuse   . GERD (gastroesophageal reflux disease)    Past Surgical History  Procedure Laterality Date  . Appendectomy     Family History  Problem Relation Age of Onset  . Lung cancer Brother   . Stomach cancer Brother   . Cancer Father    History  Substance Use Topics  . Smoking status: Current Every Day Smoker -- 1.00 packs/day for 42 years    Types: Cigarettes  . Smokeless tobacco: Not on file  . Alcohol Use: No    Review of Systems  Constitutional: Negative for fever.  Respiratory: Positive for cough and shortness of breath.   Cardiovascular: Negative for syncope.  Gastrointestinal: Negative for abdominal pain.  All other systems reviewed and are  negative.     Allergies  Review of patient's allergies indicates no known allergies.  Home Medications   Prior to Admission medications   Medication Sig Start Date End Date Taking? Authorizing Provider  aspirin 162 MG EC tablet Take 100 mg by mouth daily.    Historical Provider, MD  budesonide-formoterol (SYMBICORT) 160-4.5 MCG/ACT inhaler Inhale 2 puffs into the lungs 2 (two) times daily.    Historical Provider, MD  Olopatadine HCl (PATADAY) 0.2 % SOLN Apply to eye.    Historical Provider, MD  omeprazole (PRILOSEC) 40 MG capsule Take 1 capsule by mouth daily. 01/08/14   Historical Provider, MD  pravastatin (PRAVACHOL) 20 MG tablet Take 20 mg by mouth daily.    Historical Provider, MD   BP 136/82 mmHg  Pulse 112  Temp(Src) 98.3 F (36.8 C) (Oral)  Resp 40  SpO2 89% Physical Exam  Constitutional: He is oriented to person, place, and time. He appears well-developed and well-nourished.  Eyes: Conjunctivae and EOM are normal. Pupils are equal, round, and reactive to light.  Neck: Normal range of motion. Neck supple.  Cardiovascular: Normal rate and regular rhythm.   Pulmonary/Chest: He has wheezes.  Able to speak in sentences but working to breathe  Abdominal: Bowel sounds are normal. There is no tenderness.  Musculoskeletal: Normal range of motion.  Neurological: He is alert and oriented to person, place, and time.  Skin: Skin is warm and dry.  Psychiatric: He has a normal mood and affect.  Nursing note and vitals  reviewed.   ED Course  Procedures (including critical care time) Labs Review Labs Reviewed  BASIC METABOLIC PANEL - Abnormal; Notable for the following:    Glucose, Bld 159 (*)    GFR calc non Af Amer 78 (*)    All other components within normal limits  CBC WITH DIFFERENTIAL/PLATELET - Abnormal; Notable for the following:    WBC 12.9 (*)    Neutro Abs 8.1 (*)    Monocytes Absolute 1.5 (*)    All other components within normal limits  TROPONIN I  BRAIN  NATRIURETIC PEPTIDE    Imaging Review Dg Chest 2 View  09/27/2014   CLINICAL DATA:  Sob 2-3 wks.  Chest tightness started last week.  EXAM: CHEST - 2 VIEW  COMPARISON:  CT 09/21/2014 and earlier studies  FINDINGS: Perihilar and bibasilar nodular interstitial opacities, similar to that seen on prior CT. Heart size is normal. No effusion. Visualized skeletal structures are unremarkable.  IMPRESSION: Persistent bilateral infiltrates or edema.   Electronically Signed   By: Arne Cleveland M.D.   On: 09/27/2014 13:20     EKG Interpretation   Date/Time:  Wednesday September 27 2014 12:17:57 EST Ventricular Rate:  113 PR Interval:  154 QRS Duration: 78 QT Interval:  338 QTC Calculation: 463 R Axis:   32 Text Interpretation:  Sinus tachycardia Low voltage QRS Inferior infarct ,  age undetermined Abnormal ECG No prior for comparison Confirmed by Mingo Amber   MD, [place] ([id number]) on 09/27/2014 12:25:27 PM      MDM   Final diagnoses:  CAP (community acquired pneumonia)  COPD exacerbation    With pt outpt failure of avelox treatment think pt likely need hcap coverage. Pt has oxygen requirement.pt requesting to be admitted to [facility] long. Dr. Charlies Silvers is accepting      Glendell Docker, NP 09/27/14 [place], MD 09/27/14 [phone]

## 2014-09-27 NOTE — ED Notes (Signed)
Pt and family informed of plan of care and admission plan.

## 2014-09-27 NOTE — ED Notes (Signed)
Neb tx in process.

## 2014-09-28 ENCOUNTER — Inpatient Hospital Stay (HOSPITAL_COMMUNITY): Payer: BLUE CROSS/BLUE SHIELD

## 2014-09-28 DIAGNOSIS — J441 Chronic obstructive pulmonary disease with (acute) exacerbation: Secondary | ICD-10-CM

## 2014-09-28 DIAGNOSIS — J9601 Acute respiratory failure with hypoxia: Secondary | ICD-10-CM

## 2014-09-28 DIAGNOSIS — A419 Sepsis, unspecified organism: Principal | ICD-10-CM

## 2014-09-28 LAB — BLOOD GAS, ARTERIAL
Acid-Base Excess: 3 mmol/L — ABNORMAL HIGH (ref 0.0–2.0)
BICARBONATE: 27.3 meq/L — AB (ref 20.0–24.0)
Drawn by: 308601
O2 CONTENT: 2 L/min
O2 Saturation: 91 %
PATIENT TEMPERATURE: 98.6
TCO2: 23.8 mmol/L (ref 0–100)
pCO2 arterial: 42.7 mmHg (ref 35.0–45.0)
pH, Arterial: 7.422 (ref 7.350–7.450)
pO2, Arterial: 61.8 mmHg — ABNORMAL LOW (ref 80.0–100.0)

## 2014-09-28 LAB — COMPREHENSIVE METABOLIC PANEL
ALK PHOS: 67 U/L (ref 39–117)
ALT: 36 U/L (ref 0–53)
AST: 34 U/L (ref 0–37)
Albumin: 3.2 g/dL — ABNORMAL LOW (ref 3.5–5.2)
Anion gap: 11 (ref 5–15)
BUN: 27 mg/dL — ABNORMAL HIGH (ref 6–23)
CHLORIDE: 101 mmol/L (ref 96–112)
CO2: 25 mmol/L (ref 19–32)
CREATININE: 1.45 mg/dL — AB (ref 0.50–1.35)
Calcium: 8.5 mg/dL (ref 8.4–10.5)
GFR calc non Af Amer: 50 mL/min — ABNORMAL LOW (ref >90)
GFR, EST AFRICAN AMERICAN: 58 mL/min — AB (ref >90)
Glucose, Bld: 230 mg/dL — ABNORMAL HIGH (ref 70–99)
POTASSIUM: 4.8 mmol/L (ref 3.5–5.1)
Sodium: 137 mmol/L (ref 135–145)
TOTAL PROTEIN: 7.5 g/dL (ref 6.0–8.3)
Total Bilirubin: 0.9 mg/dL (ref 0.3–1.2)

## 2014-09-28 LAB — INFLUENZA PANEL BY PCR (TYPE A & B)
H1N1FLUPCR: NOT DETECTED
Influenza A By PCR: NEGATIVE
Influenza B By PCR: NEGATIVE

## 2014-09-28 LAB — CBC
HCT: 43.1 % (ref 39.0–52.0)
Hemoglobin: 14.2 g/dL (ref 13.0–17.0)
MCH: 30.2 pg (ref 26.0–34.0)
MCHC: 32.9 g/dL (ref 30.0–36.0)
MCV: 91.7 fL (ref 78.0–100.0)
Platelets: 233 10*3/uL (ref 150–400)
RBC: 4.7 MIL/uL (ref 4.22–5.81)
RDW: 14.5 % (ref 11.5–15.5)
WBC: 10.8 10*3/uL — ABNORMAL HIGH (ref 4.0–10.5)

## 2014-09-28 LAB — HIV ANTIBODY (ROUTINE TESTING W REFLEX): HIV Screen 4th Generation wRfx: NONREACTIVE

## 2014-09-28 LAB — GLUCOSE, CAPILLARY: Glucose-Capillary: 198 mg/dL — ABNORMAL HIGH (ref 70–99)

## 2014-09-28 LAB — BRAIN NATRIURETIC PEPTIDE: B NATRIURETIC PEPTIDE 5: 119 pg/mL — AB (ref 0.0–100.0)

## 2014-09-28 LAB — PROCALCITONIN: Procalcitonin: 0.12 ng/mL

## 2014-09-28 MED ORDER — TIOTROPIUM BROMIDE MONOHYDRATE 18 MCG IN CAPS
18.0000 ug | ORAL_CAPSULE | Freq: Every day | RESPIRATORY_TRACT | Status: DC
  Administered 2014-09-29 – 2014-10-01 (×3): 18 ug via RESPIRATORY_TRACT
  Filled 2014-09-28: qty 5

## 2014-09-28 MED ORDER — BUDESONIDE 0.5 MG/2ML IN SUSP
0.5000 mg | Freq: Two times a day (BID) | RESPIRATORY_TRACT | Status: DC
  Administered 2014-09-28 – 2014-10-01 (×7): 0.5 mg via RESPIRATORY_TRACT
  Filled 2014-09-28 (×8): qty 2

## 2014-09-28 MED ORDER — ALBUTEROL SULFATE (2.5 MG/3ML) 0.083% IN NEBU
2.5000 mg | INHALATION_SOLUTION | RESPIRATORY_TRACT | Status: DC | PRN
  Filled 2014-09-28: qty 3

## 2014-09-28 MED ORDER — LORAZEPAM 2 MG/ML IJ SOLN: 0.5000 mg | Freq: Every evening | INTRAMUSCULAR | Status: DC | PRN

## 2014-09-28 MED ORDER — FLUTICASONE PROPIONATE 50 MCG/ACT NA SUSP
2.0000 | Freq: Two times a day (BID) | NASAL | Status: DC
  Filled 2014-09-28: qty 16

## 2014-09-28 MED ORDER — MORPHINE SULFATE 2 MG/ML IJ SOLN
2.0000 mg | Freq: Once | INTRAMUSCULAR | Status: AC
  Administered 2014-09-28: 2 mg via INTRAVENOUS

## 2014-09-28 MED ORDER — SALINE SPRAY 0.65 % NA SOLN
2.0000 | Freq: Two times a day (BID) | NASAL | Status: DC
  Filled 2014-09-28: qty 44

## 2014-09-28 MED ORDER — SALINE SPRAY 0.65 % NA SOLN
2.0000 | Freq: Four times a day (QID) | NASAL | Status: DC
  Administered 2014-09-28 – 2014-10-02 (×13): 2 via NASAL
  Filled 2014-09-28 (×2): qty 44

## 2014-09-28 MED ORDER — LORATADINE 10 MG PO TABS
10.0000 mg | ORAL_TABLET | Freq: Every day | ORAL | Status: DC
  Administered 2014-09-28 – 2014-10-02 (×5): 10 mg via ORAL
  Filled 2014-09-28 (×6): qty 1

## 2014-09-28 MED ORDER — ARFORMOTEROL TARTRATE 15 MCG/2ML IN NEBU
15.0000 ug | INHALATION_SOLUTION | Freq: Two times a day (BID) | RESPIRATORY_TRACT | Status: DC
  Administered 2014-09-28 – 2014-10-01 (×7): 15 ug via RESPIRATORY_TRACT
  Filled 2014-09-28 (×11): qty 2

## 2014-09-28 MED ORDER — PANTOPRAZOLE SODIUM 40 MG PO TBEC
40.0000 mg | DELAYED_RELEASE_TABLET | Freq: Two times a day (BID) | ORAL | Status: DC
  Administered 2014-09-28 – 2014-10-02 (×8): 40 mg via ORAL
  Filled 2014-09-28 (×10): qty 1

## 2014-09-28 MED ORDER — FLUTICASONE PROPIONATE 50 MCG/ACT NA SUSP
2.0000 | Freq: Every day | NASAL | Status: DC
  Filled 2014-09-28: qty 16

## 2014-09-28 MED ORDER — ZOLPIDEM TARTRATE 5 MG PO TABS
5.0000 mg | ORAL_TABLET | Freq: Every day | ORAL | Status: DC
  Administered 2014-09-28 – 2014-10-01 (×4): 5 mg via ORAL
  Filled 2014-09-28 (×4): qty 1

## 2014-09-28 MED ORDER — MORPHINE SULFATE 2 MG/ML IJ SOLN
INTRAMUSCULAR | Status: AC
  Administered 2014-09-28: 2 mg via INTRAVENOUS
  Filled 2014-09-28: qty 1

## 2014-09-28 NOTE — Progress Notes (Signed)
Nutrition Brief Note  RD consulted for nutritional assessment.  Pt eating lunch with many visitors in room. Family had brought him filet mignon marsala and chicken soup. Pt states his appetite has been good with no changes since admission.   Spoke with pt about recent weight loss. Pt unaware of any weight changes, states he expected weight gain. Pt reports UBW of 315 lb. Pt cannot state a reason why he may have lost weight. Pt denies changes in diet or physical activity. Pt reports not being concerned about weight change.  Wt Readings from Last 15 Encounters:  09/28/14 305 lb 5.4 oz (138.5 kg)  01/18/14 317 lb (143.79 kg)    Body mass index is 40.47 kg/(m^2). Patient meets criteria for morbid obesity based on current BMI.   Current diet order is regular, patient is consuming approximately 100% of meals at this time. Labs and medications reviewed.   No nutrition interventions warranted at this time. If nutrition issues arise, please re-consult RD.   Clayton Bibles, MS, RD, LDN Pager: [phone] After Hours Pager: [phone]

## 2014-09-28 NOTE — Progress Notes (Addendum)
Patient ID: Manuel Leonard, male   DOB: 1951/05/21, 64 y.o.   MRN: [id number] [facility] PROGRESS NOTE  Manuel Leonard JIR:[id number] DOB: [birth year] DOA: 09/27/2014 PCP: [place], BETTY G, MD  Brief narrative:    64 year old pleasant male with history of COPD, recent outpatient treatment for possible pneumonia who presented to [facility] with worsening shortness of breath, cough productive of clear to yellow sputum, fevers, chills for past few days and progressively worsening in last 24 hours.   Pt was transferred from [place] [facility]. His BP on admission 107/65, HR 112, RR 24-40, T max 99.6 F and oxygen saturation 89% on room air. Blood work showed leukocytosis of 12.9. CXR showed bibasilar infiltrates. He was started on broad spectrum abx, vanco and zosyn.   Overnight, patient more anxious with episodes of hypoxia with nasal cannula oxygen support. His oxygen saturation improved with placement of Ventimask. Pulmonary consulted for input on management.  Assessment/Plan:    Principal Problem: Acute respiratory failure with hypoxia / COPD (chronic obstructive pulmonary disease) - Hypoxia likely secondary to combination of COPD and pneumonia. - As noted above, patient had hypoxia while he had oxygen support via nasal cannula. This was changed to Ventimask and his current oxygen saturation is 93%. - We will consult pulmonary for input on management. We'll see if there is overall in obtaining CT chest for further evaluation. - Continue treatment for pneumonia with vancomycin and Zosyn - We will continue nebulizer treatments scheduled and as needed - Continue Solu-Medrol at the current dose 60 mg IV every 12 hours - Respiratory culture so far shows no organisms, influenza panel by PCR is negative, blood cultures to date are negative.  Active Problems: Sepsis / CAP (community acquired pneumonia) / Leukocytosis - sepsis criteria met on admission with slight hypotension, tachycardia,  tachypnea (low grade fever of 99.6 F but note pt was already on abx so he may not have spiked fever because on antibiotics prior to admission). On blood work he had leukocytosis and on CXR evidence of bilateral infiltrates concerning for acute infectious process. - Patient was started on vancomycin and Zosyn. Continue this antibiotic regimen. - As mentioned above, blood cultures and respiratory cultures so far show no growth. Influenza panel is negative.  Elevated TSH - TSH slightly elevated, 6.69. This needs outpatient follow-up. Obtain T3 and T4.  Acute renal failure - Slight bump in creatinine since the admission, creatinine is 1.45. This could be from vancomycin. If pulmonary okay to switch to different antibiotics then we'll do so. This may help renal function recovered to normal.  DVT prophylaxis:  - SCD's bilaterally    Code Status: Full.  Family Communication:  plan of care discussed with the patient Disposition Plan: Home when stable. He still has wheezing, requires Ventimask.  IV access:  Peripheral IV  Procedures and diagnostic studies:    Dg Chest 2 View 09/27/2014   Persistent bilateral infiltrates or edema.    Medical Consultants:  Pulmonary   Other Consultants:  None   IAnti-Infectives:   Vanco 09/27/2014 --> Zosyn 09/27/2014 -->   Leisa Lenz, MD  [facility] Pager [phone]  If 7PM-7AM, please contact night-coverage [email or web address] Password [email or web address] 09/28/2014, 11:09 AM   LOS: 1 day    HPI/Subjective: No acute overnight events.  Objective: Filed Vitals:   09/28/14 0549 09/28/14 0610 09/28/14 0821 09/28/14 0840  BP:  114/60 122/69   Pulse: 89 93 97   Temp:  98 F (36.7 C) 97.6 F (36.4  C)   TempSrc:  Axillary Oral   Resp: '20 20 22   ' Height:      Weight:      SpO2: 94% 94% 97% 93%    Intake/Output Summary (Last 24 hours) at 09/28/14 1109 Last data filed at 09/28/14 1000  Gross per 24 hour  Intake   1492 ml  Output    950 ml  Net    542 ml     Exam:   General:  Pt is alert, follows commands appropriately, has Ventimask on  Cardiovascular: Regular rate and rhythm, S1/S2 (+)  Respiratory: expiratory wheezing in upper/mid lung lobes  Abdomen: Soft, non tender, non distended, bowel sounds present  Extremities: No edema, pulses DP and PT palpable bilaterally  Neuro: Grossly nonfocal  Data Reviewed: Basic Metabolic Panel:  Recent Labs Lab 09/27/14 1230 09/28/14 0456  NA 137 137  K 3.8 4.8  CL 99 101  CO2 32 25  GLUCOSE 159* 230*  BUN 18 27*  CREATININE 1.00 1.45*  CALCIUM 8.6 8.5   Liver Function Tests:  Recent Labs Lab 09/28/14 0456  AST 34  ALT 36  ALKPHOS 67  BILITOT 0.9  PROT 7.5  ALBUMIN 3.2*   No results for input(s): LIPASE, AMYLASE in the last 168 hours. No results for input(s): AMMONIA in the last 168 hours. CBC:  Recent Labs Lab 09/27/14 1230 09/28/14 0456  WBC 12.9* 10.8*  NEUTROABS 8.1*  --   HGB 14.8 14.2  HCT 45.5 43.1  MCV 91.5 91.7  PLT 282 233   Cardiac Enzymes:  Recent Labs Lab 09/27/14 1230  TROPONINI <0.03   BNP: Invalid input(s): POCBNP CBG:  Recent Labs Lab 09/28/14 0803  GLUCAP 198*    Culture, blood (routine x 2) Call MD if unable to obtain prior to antibiotics being given     Status: None (Preliminary result)   Collection Time: 09/27/14  5:31 PM  Result Value Ref Range Status   Specimen Description BLOOD LEFT HAND  Final   Special Requests BOTTLES DRAWN AEROBIC AND ANAEROBIC 5CC  Final   Culture   Final           BLOOD CULTURE RECEIVED NO GROWTH TO DATE     Report Status PENDING  Incomplete  Culture, blood (routine x 2) Call MD if unable to obtain prior to antibiotics being given     Status: None (Preliminary result)   Collection Time: 09/27/14  5:38 PM  Result Value Ref Range Status   Specimen Description BLOOD RIGHT HAND  Final   Special Requests BOTTLES DRAWN AEROBIC AND ANAEROBIC 5CC  Final   Culture   Final           BLOOD CULTURE  RECEIVED NO GROWTH TO DATE    Report Status PENDING  Incomplete  Culture, sputum-assessment     Status: None   Collection Time: 09/27/14  7:12 PM  Result Value Ref Range Status   Specimen Description SPUTUM  Final   Special Requests NONE  Final   Sputum evaluation   Final    THIS SPECIMEN IS ACCEPTABLE. RESPIRATORY CULTURE REPORT TO FOLLOW.   Report Status 09/27/2014 FINAL  Final  Culture, respiratory (NON-Expectorated)     Status: None (Preliminary result)   Collection Time: 09/27/14  7:12 PM  Result Value Ref Range Status   Specimen Description SPUTUM  Final   Special Requests NONE  Final   Gram Stain   Final    ABUNDANT WBC PRESENT, PREDOMINANTLY PMN RARE  SQUAMOUS EPITHELIAL CELLS PRESENT NO ORGANISMS SEEN    Culture PENDING  Incomplete   Report Status PENDING  Incomplete     Scheduled Meds: . aspirin EC  81 mg Oral Daily  . ipratropium-albuterol  3 mL Nebulization QID  . methylPREDNISolone (SOLU-MEDROL) injection  60 mg Intravenous Q12H  . piperacillin-tazobactam (ZOSYN)  IV  3.375 g Intravenous Q8H  . vancomycin  1,000 mg Intravenous Q12H

## 2014-09-28 NOTE — Consult Note (Signed)
Name: Manuel Leonard MRN: [id number] DOB: 03/25/1951    ADMISSION DATE:  09/27/2014 CONSULTATION DATE:  2/4  REFERRING MD :  Charlies Silvers   CHIEF COMPLAINT:  Hypoxia and COPD   BRIEF PATIENT DESCRIPTION:  64 year old male w/ GOLD D COPD admitted on 2/3 w ~ 6-7 week h/o on-going PND, sinus congestion, cough, wheeze. Treated at least twice in the out-pt setting w/out improvement. Decided to go to ER when over the last 5d PTA had worsening dyspnea. Admitted to medical svc w/ working dx of CAP and AECOPD. PCCM asked to see on 2/4 to assist w/care    SIGNIFICANT EVENTS    STUDIES:  01/2014 PFTs: FEV1  PreBD 49%-->post BD 69% ECHO 2/4>>>   HISTORY OF PRESENT ILLNESS:   This is a 64 year old male w/ established mixed obstructive and restrictive lung disease by PFTs June 2015. At that time probably more obstructive than restrictive w/ FEV1 suggesting a reversible component (PreBD 49%-->post BD 69%), and Combined w/ his CAT score placing him in GOLD D criteria for COPD w/ the restrictive component likely from obesity. Up until about 2 weeks prior to admit was still actively smoking 2 PPD. Presented to the ED on 2/3 w/ cc: about 6-7 weeks of on-going cough, nasal congestion and wheezing. Stated he has seen his PCP at least 2-3 times, treated with antibiotics and cough suppressants for bronchitis vs PNA. Cough never really improved. Dyspnea worse over the last 5-7d prior to admit w/ increased wheezing. Denied CP except w/ cough, does have LE edema. Reports cough mostly to be non-productive at this point. Worse at night w/ ongoing PND. Also endorses GERD type symptoms. Seen in [place] [facility]. Found to be hypoxic w/ possible LE infiltrates. Admitted to medical service. PCCM asked to see on 2/4 for ongoing hypoxia and to assist w/ COPD management   PAST MEDICAL HISTORY :   has a past medical history of Emphysema/COPD; Hypertension; Adenomatous polyps; COPD (chronic obstructive pulmonary disease);  Tobacco abuse; and GERD (gastroesophageal reflux disease).  has past surgical history that includes Appendectomy. Prior to Admission medications   Medication Sig Start Date End Date Taking? Authorizing Provider  albuterol (2.5 MG/3ML) 0.083% NEBU 3 mL, albuterol (5 MG/ML) 0.5% NEBU 0.5 mL Inhale 2.5 mg into the lungs every 6 (six) hours.    Yes Historical Provider, MD  aspirin EC 81 MG tablet Take 81 mg by mouth daily.   Yes Historical Provider, MD  budesonide-formoterol (SYMBICORT) 160-4.5 MCG/ACT inhaler Inhale 2 puffs into the lungs 2 (two) times daily.   Yes Historical Provider, MD   No Known Allergies  FAMILY HISTORY:  family history includes Cancer in his father; Lung cancer in his brother; Stomach cancer in his brother. SOCIAL HISTORY:  reports that he has been smoking Cigarettes.  He has a 42 pack-year smoking history. He has never used smokeless tobacco. He reports that he does not drink alcohol or use illicit drugs.  REVIEW OF SYSTEMS:   Constitutional: Negative for fever, chills, weight loss, malaise/fatigue and diaphoresis.  HENT: Negative for hearing loss, ear pain, nosebleeds, congestion, sore throat, + post-nasal gtt neck pain, tinnitus and ear discharge.   Eyes: Negative for blurred vision, double vision, photophobia, pain, discharge and redness.  Respiratory cough, hemoptysis, sputum production at times clear to yellow but more often not able to expectorate, worse at night, shortness of breath worse over last week, wheezing and stridor.   Cardiovascular: Negative for chest pain, palpitations, orthopnea,  claudication, leg swelling and PND. Frequently wakes up at night  Gastrointestinal: Negative for heartburn, nausea, vomiting, abdominal pain, diarrhea, constipation, blood in stool and melena.  Genitourinary: Negative for dysuria, urgency, frequency, hematuria and flank pain.  Musculoskeletal: Negative for myalgias, back pain, joint pain and falls.  Skin: Negative for itching  and rash.  Neurological: Negative for dizziness, tingling, tremors, sensory change, speech change, focal weakness, seizures, loss of consciousness, weakness and headaches.  Endo/Heme/Allergies: Negative for environmental allergies and polydipsia. Does not bruise/bleed easily.  SUBJECTIVE: not in acute distress   VITAL SIGNS: Temp:  [97.6 F (36.4 C)-99.6 F (37.6 C)] 97.6 F (36.4 C) (02/04 0821) Pulse Rate:  [89-115] 97 (02/04 0821) Resp:  [20-40] 22 (02/04 0821) BP: (107-136)/(60-84) 122/69 mmHg (02/04 0821) SpO2:  [88 %-98 %] 93 % (02/04 0840) FiO2 (%):  [40 %] 40 % (02/04 0840) Weight:  [134.3 kg (296 lb 1.2 oz)-138.5 kg (305 lb 5.4 oz)] 138.5 kg (305 lb 5.4 oz) (02/04 0500)  PHYSICAL EXAMINATION: General:  Sitting up in bed. No acute distress  Neuro:  Awake, alert, no focal def  HEENT:  Neck large, NCAT, posterior pharynx erythremic but no evidence of thrush; + upper airway wheeze  Cardiovascular: rrr   Lungs:  Exp wheeze  Abdomen:  Obese + bowel sounds  Musculoskeletal:  Intact  Skin:  LE swelling    Recent Labs Lab 09/27/14 1230 09/28/14 0456  NA 137 137  K 3.8 4.8  CL 99 101  CO2 32 25  BUN 18 27*  CREATININE 1.00 1.45*  GLUCOSE 159* 230*    Recent Labs Lab 09/27/14 1230 09/28/14 0456  HGB 14.8 14.2  HCT 45.5 43.1  WBC 12.9* 10.8*  PLT 282 233   Dg Chest 2 View  09/27/2014   CLINICAL DATA:  Sob 2-3 wks.  Chest tightness started last week.  EXAM: CHEST - 2 VIEW  COMPARISON:  CT 09/21/2014 and earlier studies  FINDINGS: Perihilar and bibasilar nodular interstitial opacities, similar to that seen on prior CT. Heart size is normal. No effusion. Visualized skeletal structures are unremarkable.  IMPRESSION: Persistent bilateral infiltrates or edema.   Electronically Signed   By: Arne Cleveland M.D.   On: 09/27/2014 13:20    ASSESSMENT / PLAN:  Acute Hypoxic respiratory failure AECOPD GOLD D COPD w/ some reversibility (Pre BD FEV1 49%-->61% post) Acute  sinusitis  laryngopharyngeal reflux disease (LPR) R/o CAP Nodular airspace disease  Obesity w/ PFTs suggesting component restrictive lung disease.  Probable OSA H/o pulmonary nodule  GERD  AKI HTN Hyperglycemia Hypothyroidism   Discussion Think that most of his symptom burden at this point is due to upper airway irritation/LPR from on going sinus disease and GERD  with associated AECOPD. He does have nodular infiltrates which would favor possible viral exposure,  more than PNA if anything. Acutely focus should be on treating his post-nasal gtt and gerd so that the LPR burden is reduced and stops exacerbating his lower airway disease. We will also maximize his current BD regimen. Going forward he will need on-going education about rescue vs maintenance inhalers (was using symbicort up to 3 times a day), and also need to look into possible sleep apnea. Has some lower extremity edema and I am concerned that he may have some underlying component of cor pulmonale. He was seen for his nodule this spring with [place], but I think he would benefit from follow-up in regards to his COPD and possibly sleep apnea as well going  forward.   Rec Cont supplemental oxygen  Add flutter and IS Cont current dose of solumedrol Ck PCT, but will go ahead and dc vanc  Continuing zosyn is reasonable Ck RVP, BNP and think he would benefit from ECHO Will change duoneb to brovana and budesonide w/ spiriva Also add aggressive pulmonary hygiene regimen NS-->afrin->flonase  Also add GERD   Erick Colace ACNP-BC [facility] Pager # [phone] OR # [phone] if no answer   09/28/2014, 10:26 AM

## 2014-09-28 NOTE — Progress Notes (Signed)
OT Cancellation Note  Patient Details Name: Manuel Leonard MRN: [id number] DOB: [birth year]   Cancelled Treatment:    Reason Eval/Treat Not Completed: Other (comment) X ray waiting to get in room.  Will re attempt later or next day. Thanks,.   Betsy Pries 09/28/2014, 10:17 AM

## 2014-09-28 NOTE — Evaluation (Signed)
Physical Therapy Evaluation Patient Details Name: Manuel Leonard MRN: [id number] DOB: [birth year] Today's Date: 09/28/2014   History of Present Illness  64 year old pleasant male adm with respiratory distress; PMHx: COPD, recent outpatient treatment for possible pneumonia   Clinical Impression  Pt doing well; slightly deconditioned, with DOE; pt able to amb 400' with PT today with 2 standing rests; see note below for details; no further PT needs; recommend pt amb with nsg staff as tol; will sign off    Follow Up Recommendations No PT follow up    Equipment Recommendations  None recommended by PT    Recommendations for Other Services       Precautions / Restrictions Precautions Precaution Comments: monitor sats, O2      Mobility  Bed Mobility               General bed mobility comments: NT, pt on EOB  Transfers Overall transfer level: Modified independent Equipment used: None                Ambulation/Gait Ambulation/Gait assistance: Supervision;Modified independent (Device/Increase time) Ambulation Distance (Feet): 400 Feet Assistive device: None (or IV push) Gait Pattern/deviations: Step-through pattern     General Gait Details: 2 standing rests d/t DOE, otherwise gait steady, pt amb at fairly reapid pace, encouraged to take breaks as needed; Sats 93-94% throughout; [place] used at 3-4 L during gait, pt on venti mask in room  Stairs            Wheelchair Mobility    Modified Rankin (Stroke Patients Only)       Balance Overall balance assessment: No apparent balance deficits (not formally assessed)                                           Pertinent Vitals/Pain Pain Assessment: No/denies pain    Home Living Family/patient expects to be discharged to:: Private residence Living Arrangements: Spouse/significant other Available Help at Discharge: Family;Available 24 hours/day Type of Home: House Home Access: Stairs to  enter Entrance Stairs-Rails: None Entrance Stairs-Number of Steps: 1 Home Layout: One level Home Equipment: None      Prior Function Level of Independence: Independent               Hand Dominance        Extremity/Trunk Assessment   Upper Extremity Assessment: Defer to OT evaluation           Lower Extremity Assessment: Overall WFL for tasks assessed         Communication   Communication: No difficulties  Cognition Arousal/Alertness: Awake/alert Behavior During Therapy: WFL for tasks assessed/performed Overall Cognitive Status: Within Functional Limits for tasks assessed                      General Comments      Exercises        Assessment/Plan    PT Assessment Patent does not need any further PT services  PT Diagnosis Difficulty walking   PT Problem List    PT Treatment Interventions     PT Goals (Current goals can be found in the Care Plan section) Acute Rehab PT Goals PT Goal Formulation: All assessment and education complete, DC therapy    Frequency     Barriers to discharge        Co-evaluation  End of Session   Activity Tolerance: Patient tolerated treatment well Patient left: Other (comment) (EOB) Nurse Communication: Mobility status         Time: 0960-4540 PT Time Calculation (min) (ACUTE ONLY): 26 min   Charges:   PT Evaluation $Initial PT Evaluation Tier I: 1 Procedure     PT G CodesKenyon Leonard 2014-10-01, 2:44 PM

## 2014-09-28 NOTE — Progress Notes (Signed)
Shift event: RN paged secondary to pt being restless and anxious. O2 sat 88% on 3L. Ordered ABG. Also, tachpneic in mid to upper 22s. Order to place Venti Mask at 35%.  NP to bedside. S: when asked about his tachypnea, he says he always breathes fast. Breathing is better after changing to venti mask. O: VS reviewed. O2 sat now 97% on venti mask at 35%. Appears fairly well, morbidly obese. Non toxic. Alert and oriented. No distress. No use of accessory muscles. Diminished lung sounds, no wheezing or crackles.  A/P: 1. Acute hypoxia-quickly improved. ABG with low PO2, normal pH and PCO2. O2 sats improved on Venti mask. Instructed RN to keep sats over 93% and may increase venti mask to 50% if necessary. Morphine to slow down breathing. No benzos given respiratory failure.  2. COPD exacerbation and possible PNA-continue present plan except as above.  3. Obesity-I wonder if he has sleep apnea?? Will follow. Clance Boll, NP [facility]

## 2014-09-29 DIAGNOSIS — J449 Chronic obstructive pulmonary disease, unspecified: Secondary | ICD-10-CM

## 2014-09-29 DIAGNOSIS — J441 Chronic obstructive pulmonary disease with (acute) exacerbation: Secondary | ICD-10-CM | POA: Insufficient documentation

## 2014-09-29 LAB — GLUCOSE, CAPILLARY: Glucose-Capillary: 228 mg/dL — ABNORMAL HIGH (ref 70–99)

## 2014-09-29 MED ORDER — MENTHOL 3 MG MT LOZG
1.0000 | LOZENGE | OROMUCOSAL | Status: DC | PRN
  Filled 2014-09-29: qty 9

## 2014-09-29 MED ORDER — MAGIC MOUTHWASH W/LIDOCAINE
2.0000 mL | Freq: Four times a day (QID) | ORAL | Status: DC
  Administered 2014-09-29 – 2014-10-02 (×8): 2 mL via ORAL
  Filled 2014-09-29 (×15): qty 5

## 2014-09-29 NOTE — Progress Notes (Signed)
OT Cancellation Note  Patient Details Name: Manuel Leonard MRN: [id number] DOB: 04/05/1951   Cancelled Treatment:    Reason Eval/Treat Not Completed: Other (comment).  Spoke to pt:  He doesn't feel he has any OT needs.  Just returned from bathroom and wife will assist hiim as needed.    Julieana Eshleman 09/29/2014, 11:57 AM  Lesle Chris, OTR/L [phone] 09/29/2014

## 2014-09-29 NOTE — Progress Notes (Signed)
Patient ID: Manuel Leonard, male   DOB: 05/15/1951, 64 y.o.   MRN: [id number] [facility] PROGRESS NOTE  Manuel Leonard [id number]:[id number] DOB: 03/31/1951 DOA: 09/27/2014 PCP: Martinique, BETTY G, MD  Brief narrative:    64 year old pleasant male with history of COPD, recent outpatient treatment for possible pneumonia who presented to [place] with worsening shortness of breath, cough productive of clear to yellow sputum, fevers, chills for past few days and progressively worsening in last 24 hours.   Pt was transferred from [place] center. His BP on admission 107/65, HR 112, RR 24-40, T max 99.6 F and oxygen saturation 89% on room air. Blood work showed leukocytosis of 12.9. CXR showed bibasilar infiltrates. He was started on broad spectrum abx, vanco and zosyn.   Overnight 09/27/2014, patient was in more respiratory distress with hypoxia and required Ventimask. Pulmonary has seen the patient in consultation.  Assessment/Plan:    Principal Problem: Acute respiratory failure with hypoxia / COPD (chronic obstructive pulmonary disease) - Hypoxia likely secondary to combination of COPD and pneumonia. - Pulmonary has seen the patient in consultation. Changes made to nebulizer treatment regimen. Currently patient is on Brovana nebulizer twice daily, Pulmicort nebulizer twice daily, Spiriva daily. He is also on Flonase nasal spray twice daily, Claritin 10 mg daily. - Repeat chest x-ray 09/28/2014 shows persistent interstitial nodular densities which likely reflect pneumonia versus interstitial edema, stable since yesterday's study. - Continue Solu-Medrol 60 mg IV every 12 hours. - Per pulmonary, started Protonix 40 mg twice daily. - Continue treatment for pneumonia. Patient was initially on vancomycin and Zosyn. Vancomycin stopped 09/28/2014. - Respiratory culture so far shows no organisms, influenza panel by PCR is negative, blood cultures to date are negative. - Continue oxygen support  via nasal cannula or Ventimask to keep oxygen saturation above 90%.  Active Problems: Sepsis / CAP (community acquired pneumonia) / Leukocytosis - sepsis criteria met on admission with slight hypotension, tachycardia, tachypnea (low grade fever of 99.6 F but note pt was already on abx so he may not have spiked fever because on antibiotics prior to admission). On blood work he had leukocytosis and on CXR evidence of bilateral infiltrates concerning for acute infectious process. - antibiotics on admission were vancomycin and Zosyn. Vancomycin stopped 09/28/2014. - As mentioned above, blood cultures and respiratory cultures so far show no growth. Influenza panel is negative.  Elevated TSH - TSH slightly elevated, 6.6Patient will need outpatient follow-up TSH.   Acute renal failure - Slight bump in creatinine since the admission, to 1.45. This could be from vancomycin. We stopped vancomycin 09/28/2014.  - Repeat BMP in the morning.   DVT prophylaxis:  - SCD's bilaterally while pt is in hospital    Code Status: Full.  Family Communication:  plan of care discussed with the patient and his family at the bedside  Disposition Patient is still receiving IV antibiotics. He still requires Ventimask to keep up with oxygen saturation. Not stable for discharge at this time.  IV access:  Peripheral IV  Procedures and diagnostic studies:    Dg Chest 2 View 09/27/2014   Persistent bilateral infiltrates or edema.    Dg Chest Port 1 View 09/28/2014   Persistent diffusely increased interstitial-nodular densities which likely reflect pneumonia or less likely interstitial edema. The findings are stable since yesterday's study but have dramatically increased since the study of September 04, 2014 and the CT of January 28th, 2016.    Medical Consultants:  Pulmonary   Other  Consultants:  None   IAnti-Infectives:   Vanco 09/27/2014 --> 09/28/2014  Zosyn 09/27/2014 -->    Leisa Lenz, MD  [facility] Pager [phone]  If 7PM-7AM, please contact night-coverage [email or web address] Password [email or web address] 09/29/2014, 10:56 AM   LOS: 2 days    HPI/Subjective: No acute overnight events.  Objective: Filed Vitals:   09/28/14 1400 09/28/14 1927 09/28/14 2119 09/29/14 0550  BP: 108/60  115/64 101/40  Pulse: 89  103 83  Temp: 99 F (37.2 C)  98.4 F (36.9 C) 98.3 F (36.8 C)  TempSrc: Oral  Oral Oral  Resp:   22 20  Height:      Weight:      SpO2: 94% 93% 93% 95%    Intake/Output Summary (Last 24 hours) at 09/29/14 1056 Last data filed at 09/29/14 1000  Gross per 24 hour  Intake   1740 ml  Output   2300 ml  Net   -560 ml    Exam:   General:  Pt is alert, follows commands appropriately, not in acute distress  Cardiovascular: Regular rate and rhythm, S1/S2, no murmurs  Respiratory: mild wheezing in upper and mid lung lobes, coarse breath sounds bilaterally   Abdomen: Soft, non tender, non distended, bowel sounds present  Extremities: No edema, pulses DP and PT palpable bilaterally  Neuro: Grossly nonfocal  Data Reviewed: Basic Metabolic Panel:  Recent Labs Lab 09/27/14 1230 09/28/14 0456  NA 137 137  K 3.8 4.8  CL 99 101  CO2 32 25  GLUCOSE 159* 230*  BUN 18 27*  CREATININE 1.00 1.45*  CALCIUM 8.6 8.5   Liver Function Tests:  Recent Labs Lab 09/28/14 0456  AST 34  ALT 36  ALKPHOS 67  BILITOT 0.9  PROT 7.5  ALBUMIN 3.2*   No results for input(s): LIPASE, AMYLASE in the last 168 hours. No results for input(s): AMMONIA in the last 168 hours. CBC:  Recent Labs Lab 09/27/14 1230 09/28/14 0456  WBC 12.9* 10.8*  NEUTROABS 8.1*  --   HGB 14.8 14.2  HCT 45.5 43.1  MCV 91.5 91.7  PLT 282 233   Cardiac Enzymes:  Recent Labs Lab 09/27/14 1230  TROPONINI <0.03   BNP: Invalid input(s): POCBNP CBG:  Recent Labs Lab 09/28/14 0803 09/29/14 0822  GLUCAP 198* 228*    Recent Results (from the past 240 hour(s))  Culture, blood (routine x  2) Call MD if unable to obtain prior to antibiotics being given     Status: None (Preliminary result)   Collection Time: 09/27/14  5:31 PM  Result Value Ref Range Status   Specimen Description BLOOD LEFT HAND  Final   Special Requests BOTTLES DRAWN AEROBIC AND ANAEROBIC 5CC  Final   Culture   Final           BLOOD CULTURE RECEIVED NO GROWTH TO DATE CULTURE WILL BE HELD FOR 5 DAYS BEFORE ISSUING A FINAL NEGATIVE REPORT Performed at [facility]    Report Status PENDING  Incomplete  Culture, blood (routine x 2) Call MD if unable to obtain prior to antibiotics being given     Status: None (Preliminary result)   Collection Time: 09/27/14  5:38 PM  Result Value Ref Range Status   Specimen Description BLOOD RIGHT HAND  Final   Special Requests BOTTLES DRAWN AEROBIC AND ANAEROBIC 5CC  Final   Culture   Final           BLOOD CULTURE RECEIVED NO GROWTH TO DATE CULTURE WILL  BE HELD FOR 5 DAYS BEFORE ISSUING A FINAL NEGATIVE REPORT Performed at [facility]    Report Status PENDING  Incomplete  Culture, sputum-assessment     Status: None   Collection Time: 09/27/14  7:12 PM  Result Value Ref Range Status   Specimen Description SPUTUM  Final   Special Requests NONE  Final   Sputum evaluation   Final    THIS SPECIMEN IS ACCEPTABLE. RESPIRATORY CULTURE REPORT TO FOLLOW.   Report Status 09/27/2014 FINAL  Final  Culture, respiratory (NON-Expectorated)     Status: None (Preliminary result)   Collection Time: 09/27/14  7:12 PM  Result Value Ref Range Status   Specimen Description SPUTUM  Final   Special Requests NONE  Final   Gram Stain   Final    ABUNDANT WBC PRESENT, PREDOMINANTLY PMN RARE SQUAMOUS EPITHELIAL CELLS PRESENT NO ORGANISMS SEEN Performed at [facility]    Culture   Final    NORMAL OROPHARYNGEAL FLORA Performed at [facility]    Report Status PENDING  Incomplete     Scheduled Meds: . arformoterol  15 mcg Nebulization Q12H  . aspirin EC  81  mg Oral Daily  . budesonide (PULMICORT) nebulizer solution  0.5 mg Nebulization Q12H  . sodium chloride  2 spray Each Nare QID   Followed by  . [START ON 10/02/2014] sodium chloride  2 spray Each Nare BID   Followed by  . [START ON 10/06/2014] fluticasone  2 spray Each Nare BID   Followed by  . [START ON 10/11/2014] fluticasone  2 spray Each Nare Daily  . loratadine  10 mg Oral Daily  . magic mouthwash w/lidocaine  2 mL Oral QID  . methylPREDNISolone (SOLU-MEDROL) injection  60 mg Intravenous Q12H  . pantoprazole  40 mg Oral BID AC  . piperacillin-tazobactam (ZOSYN)  IV  3.375 g Intravenous Q8H  . tiotropium  18 mcg Inhalation Daily  . zolpidem  5 mg Oral QHS

## 2014-09-29 NOTE — Progress Notes (Signed)
Name: Markeese Boyajian MRN: [id number] DOB: 1951/03/15    ADMISSION DATE:  09/27/2014 CONSULTATION DATE:  2/4  REFERRING MD :  Charlies Silvers   CHIEF COMPLAINT:  Hypoxia and COPD   BRIEF PATIENT DESCRIPTION:  64 year old male w/ GOLD D COPD admitted on 2/3 w ~ 6-7 week h/o on-going PND, sinus congestion, cough, wheeze. Treated at least twice in the out-pt setting w/out improvement. Decided to go to ER when over the last 5d PTA had worsening dyspnea. Admitted to medical svc w/ working dx of CAP and AECOPD. PCCM asked to see on 2/4 to assist w/care    SIGNIFICANT EVENTS    STUDIES:  01/2014 PFTs: FEV1  PreBD 49%-->post BD 69% ECHO 4/0>JW I diastolic dysfxn, RV size was nml, RA size was nml, LV FXN 55-60% SUBJECTIVE: not in acute distress   VITAL SIGNS: Temp:  [97.5 F (36.4 C)-99 F (37.2 C)] 97.5 F (36.4 C) (02/05 1100) Pulse Rate:  [83-103] 99 (02/05 1100) Resp:  [20-22] 20 (02/05 1100) BP: (101-116)/(40-64) 116/61 mmHg (02/05 1100) SpO2:  [90 %-95 %] 90 % (02/05 1220) Room air  PHYSICAL EXAMINATION: General:  Sitting up in bed. No acute distress  Neuro:  Awake, alert, no focal def  HEENT:  Neck large, NCAT, posterior pharynx erythremic but no evidence of thrush; + upper airway wheeze  Improved some Cardiovascular: rrr   Lungs:  Exp wheeze about the same  Abdomen:  Obese + bowel sounds  Musculoskeletal:  Intact  Skin:  LE swelling    Recent Labs Lab 09/27/14 1230 09/28/14 0456  NA 137 137  K 3.8 4.8  CL 99 101  CO2 32 25  BUN 18 27*  CREATININE 1.00 1.45*  GLUCOSE 159* 230*    Recent Labs Lab 09/27/14 1230 09/28/14 0456  HGB 14.8 14.2  HCT 45.5 43.1  WBC 12.9* 10.8*  PLT 282 233   Dg Chest 2 View  09/27/2014   CLINICAL DATA:  Sob 2-3 wks.  Chest tightness started last week.  EXAM: CHEST - 2 VIEW  COMPARISON:  CT 09/21/2014 and earlier studies  FINDINGS: Perihilar and bibasilar nodular interstitial opacities, similar to that seen on prior CT. Heart size is  normal. No effusion. Visualized skeletal structures are unremarkable.  IMPRESSION: Persistent bilateral infiltrates or edema.   Electronically Signed   By: Arne Cleveland M.D.   On: 09/27/2014 13:20   Dg Chest Port 1 View  09/28/2014   CLINICAL DATA:  COPD, shortness of breath and wheezing, possible pneumonia ; long history of tobacco use  EXAM: PORTABLE CHEST - 1 VIEW  COMPARISON:  PA and lateral chest of September 27, 2014 and September 04, 2014  FINDINGS: Since the January study the interstitial markings have markedly increased diffusely. There is a subtle nodular pattern bilaterally. No alveolar infiltrates are demonstrated. The cardiac silhouette is top-normal in size. The pulmonary vascularity is not engorged. The trachea is midline. There is no pleural effusion. The bony thorax is unremarkable.  IMPRESSION: Persistent diffusely increased interstitial-nodular densities which likely reflect pneumonia or less likely interstitial edema. The findings are stable since yesterday's study but have dramatically increased since the study of September 04, 2014 and the CT of January 28th, 2016.   Electronically Signed   By: David  Martinique   On: 09/28/2014 12:51    ASSESSMENT / PLAN:  Acute Hypoxic respiratory failure AECOPD GOLD D COPD w/ some reversibility (Pre BD FEV1 49%-->61% post) Acute sinusitis  laryngopharyngeal reflux disease (LPR) R/o  CAP Nodular pulmonary infiltrates  Obesity w/ PFTs suggesting component restrictive lung disease.  Probable OSA H/o pulmonary nodule  GERD  AKI HTN Hyperglycemia Hypothyroidism   Discussion Looks a little better. Symptom burden improved   Rec Cont supplemental oxygen  Cont flutter and IS Taper steroids as able  Continuing zosyn is reasonable; but could transition to oral augmentin at any point -->would treat at least 10d given concern about sinus involvement  F/u RVP cont brovana and budesonide w/ spiriva-->transition back to symbicort and spiriva at  d/c Cont nasal hygiene regimen NS-->afrin->flonase  cont GERD rx  Erick Colace ACNP-BC [place] Pager # [phone] OR # [phone] if no answer   09/29/2014, 12:41 PM

## 2014-09-29 NOTE — Progress Notes (Signed)
*  PRELIMINARY RESULTS* Echocardiogram 2D Echocardiogram has been performed.  Leavy Cella 09/29/2014, 10:40 AM

## 2014-09-30 ENCOUNTER — Inpatient Hospital Stay (HOSPITAL_COMMUNITY): Payer: BLUE CROSS/BLUE SHIELD

## 2014-09-30 ENCOUNTER — Encounter (HOSPITAL_COMMUNITY): Payer: Self-pay | Admitting: Radiology

## 2014-09-30 DIAGNOSIS — J189 Pneumonia, unspecified organism: Secondary | ICD-10-CM

## 2014-09-30 DIAGNOSIS — D72829 Elevated white blood cell count, unspecified: Secondary | ICD-10-CM

## 2014-09-30 LAB — CBC
HEMATOCRIT: 46.3 % (ref 39.0–52.0)
Hemoglobin: 14.3 g/dL (ref 13.0–17.0)
MCH: 29.5 pg (ref 26.0–34.0)
MCHC: 30.9 g/dL (ref 30.0–36.0)
MCV: 95.5 fL (ref 78.0–100.0)
PLATELETS: 244 10*3/uL (ref 150–400)
RBC: 4.85 MIL/uL (ref 4.22–5.81)
RDW: 14.8 % (ref 11.5–15.5)
WBC: 14.4 10*3/uL — AB (ref 4.0–10.5)

## 2014-09-30 LAB — BASIC METABOLIC PANEL
Anion gap: 7 (ref 5–15)
BUN: 33 mg/dL — AB (ref 6–23)
CO2: 32 mmol/L (ref 19–32)
Calcium: 8.9 mg/dL (ref 8.4–10.5)
Chloride: 98 mmol/L (ref 96–112)
Creatinine, Ser: 1.51 mg/dL — ABNORMAL HIGH (ref 0.50–1.35)
GFR calc Af Amer: 55 mL/min — ABNORMAL LOW (ref >90)
GFR calc non Af Amer: 47 mL/min — ABNORMAL LOW (ref >90)
GLUCOSE: 375 mg/dL — AB (ref 70–99)
Potassium: 5.1 mmol/L (ref 3.5–5.1)
SODIUM: 137 mmol/L (ref 135–145)

## 2014-09-30 LAB — GLUCOSE, CAPILLARY: GLUCOSE-CAPILLARY: 358 mg/dL — AB (ref 70–99)

## 2014-09-30 LAB — CULTURE, RESPIRATORY W GRAM STAIN: Culture: NORMAL

## 2014-09-30 LAB — PROCALCITONIN: Procalcitonin: <0.1 ng/mL

## 2014-09-30 LAB — CULTURE, RESPIRATORY

## 2014-09-30 MED ORDER — METHYLPREDNISOLONE SODIUM SUCC 40 MG IJ SOLR
40.0000 mg | INTRAMUSCULAR | Status: DC
Start: 2014-10-01 — End: 2014-10-01
  Filled 2014-09-30: qty 1

## 2014-09-30 MED ORDER — METHYLPREDNISOLONE SODIUM SUCC 125 MG IJ SOLR: 60.0000 mg | INTRAMUSCULAR | Status: DC

## 2014-09-30 MED ORDER — METHYLPREDNISOLONE SODIUM SUCC 125 MG IJ SOLR
60.0000 mg | INTRAMUSCULAR | Status: DC
  Administered 2014-09-30: 60 mg via INTRAVENOUS
  Filled 2014-09-30: qty 0.96

## 2014-09-30 NOTE — Progress Notes (Signed)
Patient ID: Manuel Leonard, male   DOB: [birth year], 64 y.o.   MRN: [id number] [facility] PROGRESS NOTE  Dawid Dupriest WUJ:[id number] DOB: 23-Aug-1951 DOA: 09/27/2014 PCP: [place], BETTY G, MD  Brief narrative:    64 year old pleasant male with history of COPD, recent outpatient treatment for possible pneumonia who presented to [place] with worsening shortness of breath, cough productive of clear to yellow sputum, fevers, chills for past few days and progressively worsening in last 24 hours.   Pt was transferred from [place] center. His BP on admission 107/65, HR 112, RR 24-40, T max 99.6 F and oxygen saturation 89% on room air. Blood work showed leukocytosis of 12.9. CXR showed bibasilar infiltrates. He was started on broad spectrum abx, vanco and zosyn.   On 09/27/2014, patient in more respiratory distress with hypoxia and required Ventimask. Pulmonary has seen the patient in consultation. Nebulizer regimen changed to pulmicort and brovana and he felt better. Antibiotics narrowed down to zosyn only and per pulmonary will switch to Augmentin on discharge.    Assessment/Plan:    Principal Problem: Acute respiratory failure with hypoxia / COPD (chronic obstructive pulmonary disease) - Hypoxia likely secondary to COPD and pneumonia. - Pulmonary has made changes to nebulizer treatment regimen, started brovana and pulmicort nebs BID. Will switch to symbicort and Spiriva on discharge.  - taper down solumedrol from twice a day to once a day regimen today. Prednisone on discharge.  - CXR repeated 09/28/2014 with findings of persistent interstitial nodular densities which likely reflect pneumonia versus interstitial edema. - patient was on broad spectrum abx, vanco and zosyn. Now on zosyn only. Vancomycin was stopped 09/28/2014.Will change to Augmentin on discharge.  - Respiratory culture shows no organisms, influenza panel by PCR is negative, blood cultures to date are negative. -  Continue oxygen support via nasal cannula or Ventimask to keep oxygen saturation above 90%.  Active Problems: Sepsis / CAP (community acquired pneumonia) / Leukocytosis - sepsis criteria met on admission with slight hypotension, tachycardia, tachypnea (low grade fever of 99.6 F but note pt was already on abx so he may not have spiked fever because on antibiotics prior to admission). Additionally, pt had leukocytosis. CXR with evidence of pneumonia. - was on broad spectrum abx since admission. Now on zosyn only. Vancomycin stopped 09/28/2014. - Blood cultures and respiratory cultures so far show no growth. Influenza panel is negative.  Pain in the neck  - Some pain in the neck as well as slight difficulty swallowing, congestion in upper airways. We added Magic mouthwash with lidocaine yesterday which did not provide significant symptomatic relief. Not sure what is really going on. Will get a CT scan of the neck for further evaluation  Elevated TSH - TSH slightly elevated, 6.6Patient will need outpatient follow-up TSH.   Acute renal failure - Slight bump in creatinine since the admission, up to 1.51. - possibly from vancomycin - continue to monitor renal function   DVT prophylaxis:  - SCD's bilaterally while in hospital    Code Status: Full.  Family Communication: plan of care discussed with the patient and his family at the bedside  Disposition Home tomorrow am; will transition to PO antibiotic on discharge   IV access:  Peripheral IV  Procedures and diagnostic studies:   Dg Chest 2 View 09/27/2014 Persistent bilateral infiltrates or edema.   Dg Chest Port 1 View 09/28/2014 Persistent diffusely increased interstitial-nodular densities which likely reflect pneumonia or less likely interstitial edema. The findings are  stable since yesterday's study but have dramatically increased since the study of September 04, 2014 and the CT of January 28th, 2016.   Medical  Consultants:  Pulmonary   Other Consultants:  None   IAnti-Infectives:   Vanco 09/27/2014 --> 09/28/2014  Zosyn 09/27/2014 -->    Leisa Lenz, MD  [facility] Pager [phone]  If 7PM-7AM, please contact night-coverage [email or web address] Password [email or web address] 09/30/2014, 7:15 AM   LOS: 3 days    HPI/Subjective: No acute overnight events.  Objective: Filed Vitals:   09/29/14 2146 09/29/14 2151 09/29/14 2311 09/30/14 0550  BP:    138/68  Pulse:    89  Temp:    97.6 F (36.4 C)  TempSrc:    Oral  Resp:    20  Height:      Weight:   137.848 kg (303 lb 14.4 oz)   SpO2: 90% 95%  89%    Intake/Output Summary (Last 24 hours) at 09/30/14 0715 Last data filed at 09/30/14 0500  Gross per 24 hour  Intake 1122.5 ml  Output   2300 ml  Net -1177.5 ml    Exam:   General:  Pt is awake, feels little better this am  Cardiovascular: Regular rate and rhythm, S1/S2 (+)  Respiratory: mild wheezing in upper lung lobes, no crackles  Abdomen: Soft, non tender, non distended, bowel sounds present  Extremities: No edema, non tender LE, pulses palpable bilaterally  Neuro: Grossly nonfocal  Data Reviewed: Basic Metabolic Panel:  Recent Labs Lab 09/27/14 1230 09/28/14 0456 09/30/14 0546  NA 137 137 137  K 3.8 4.8 5.1  CL 99 101 98  CO2 32 25 32  GLUCOSE 159* 230* 375*  BUN 18 27* 33*  CREATININE 1.00 1.45* 1.51*  CALCIUM 8.6 8.5 8.9   Liver Function Tests:  Recent Labs Lab 09/28/14 0456  AST 34  ALT 36  ALKPHOS 67  BILITOT 0.9  PROT 7.5  ALBUMIN 3.2*   No results for input(s): LIPASE, AMYLASE in the last 168 hours. No results for input(s): AMMONIA in the last 168 hours. CBC:  Recent Labs Lab 09/27/14 1230 09/28/14 0456 09/30/14 0546  WBC 12.9* 10.8* 14.4*  NEUTROABS 8.1*  --   --   HGB 14.8 14.2 14.3  HCT 45.5 43.1 46.3  MCV 91.5 91.7 95.5  PLT 282 233 244   Cardiac Enzymes:  Recent Labs Lab 09/27/14 1230  TROPONINI <0.03   BNP: Invalid  input(s): POCBNP CBG:  Recent Labs Lab 09/28/14 0803 09/29/14 0822  GLUCAP 198* 228*    Recent Results (from the past 240 hour(s))  Culture, blood (routine x 2) Call MD if unable to obtain prior to antibiotics being given     Status: None (Preliminary result)   Collection Time: 09/27/14  5:31 PM  Result Value Ref Range Status   Specimen Description BLOOD LEFT HAND  Final   Special Requests BOTTLES DRAWN AEROBIC AND ANAEROBIC 5CC  Final   Culture   Final           BLOOD CULTURE RECEIVED NO GROWTH TO DATE CULTURE WILL BE HELD FOR 5 DAYS BEFORE ISSUING A FINAL NEGATIVE REPORT Performed at [facility]    Report Status PENDING  Incomplete  Culture, blood (routine x 2) Call MD if unable to obtain prior to antibiotics being given     Status: None (Preliminary result)   Collection Time: 09/27/14  5:38 PM  Result Value Ref Range Status   Specimen Description BLOOD RIGHT HAND  Final   Special Requests BOTTLES DRAWN AEROBIC AND ANAEROBIC 5CC  Final   Culture   Final           BLOOD CULTURE RECEIVED NO GROWTH TO DATE CULTURE WILL BE HELD FOR 5 DAYS BEFORE ISSUING A FINAL NEGATIVE REPORT Performed at [facility]    Report Status PENDING  Incomplete  Culture, sputum-assessment     Status: None   Collection Time: 09/27/14  7:12 PM  Result Value Ref Range Status   Specimen Description SPUTUM  Final   Special Requests NONE  Final   Sputum evaluation   Final    THIS SPECIMEN IS ACCEPTABLE. RESPIRATORY CULTURE REPORT TO FOLLOW.   Report Status 09/27/2014 FINAL  Final  Culture, respiratory (NON-Expectorated)     Status: None (Preliminary result)   Collection Time: 09/27/14  7:12 PM  Result Value Ref Range Status   Specimen Description SPUTUM  Final   Special Requests NONE  Final   Gram Stain   Final    ABUNDANT WBC PRESENT, PREDOMINANTLY PMN RARE SQUAMOUS EPITHELIAL CELLS PRESENT NO ORGANISMS SEEN Performed at [facility]    Culture   Final    NORMAL  OROPHARYNGEAL FLORA Performed at [facility]    Report Status PENDING  Incomplete     Scheduled Meds: . arformoterol  15 mcg Nebulization Q12H  . aspirin EC  81 mg Oral Daily  . budesonide (PULMICORT) nebulizer solution  0.5 mg Nebulization Q12H  . sodium chloride  2 spray Each Nare QID   Followed by  . [START ON 10/02/2014] sodium chloride  2 spray Each Nare BID   Followed by  . [START ON 10/06/2014] fluticasone  2 spray Each Nare BID   Followed by  . [START ON 10/11/2014] fluticasone  2 spray Each Nare Daily  . loratadine  10 mg Oral Daily  . magic mouthwash w/lidocaine  2 mL Oral QID  . methylPREDNISolone (SOLU-MEDROL) injection  60 mg Intravenous Q24H  . pantoprazole  40 mg Oral BID AC  . piperacillin-tazobactam (ZOSYN)  IV  3.375 g Intravenous Q8H  . tiotropium  18 mcg Inhalation Daily  . zolpidem  5 mg Oral QHS   Continuous Infusions:

## 2014-09-30 NOTE — Progress Notes (Signed)
Name: Manuel Leonard MRN: [id number] DOB: 1951/02/17    ADMISSION DATE:  09/27/2014 CONSULTATION DATE:  2/4  REFERRING MD :  Charlies Silvers   CHIEF COMPLAINT:  Hypoxia and COPD   BRIEF PATIENT DESCRIPTION:  64 year old male w/ GOLD D COPD admitted on 2/3 w ~ 6-7 week h/o on-going PND, sinus congestion, cough, wheeze. Treated at least twice in the out-pt setting w/out improvement. Decided to go to ER when over the last 5d PTA had worsening dyspnea. Admitted to medical svc w/ working dx of CAP and AECOPD. PCCM asked to see on 2/4 to assist w/care    SIGNIFICANT EVENTS   Subjective: Not coughing, Thinks he may be a little better.  STUDIES:  01/2014 PFTs: FEV1  PreBD 49%-->post BD 69% ECHO 9/4>BS I diastolic dysfxn, RV size was nml, RA size was nml, LV FXN 55-60% SUBJECTIVE: not in acute distress   VITAL SIGNS: Temp:  [97.6 F (36.4 C)-98.6 F (37 C)] 97.7 F (36.5 C) (02/06 0700) Pulse Rate:  [88-101] 88 (02/06 0700) Resp:  [18-20] 18 (02/06 0700) BP: (120-138)/(60-81) 133/81 mmHg (02/06 0700) SpO2:  [89 %-95 %] 93 % (02/06 0917) Weight:  [137.423 kg (302 lb 15.4 oz)-137.848 kg (303 lb 14.4 oz)] 137.423 kg (302 lb 15.4 oz) (02/06 0700) Room air  PHYSICAL EXAMINATION: General:  Sitting up in chair. No acute distress  Neuro:  Awake, alert, no focal def  HEENT:  Neck large, NCAT,  Cardiovascular: rrr   Lungs:  Trace wheeze   Abdomen:  Obese + bowel sounds  Musculoskeletal:  Intact  Skin:  LE swelling    Recent Labs Lab 09/27/14 1230 09/28/14 0456 09/30/14 0546  NA 137 137 137  K 3.8 4.8 5.1  CL 99 101 98  CO2 32 25 32  BUN 18 27* 33*  CREATININE 1.00 1.45* 1.51*  GLUCOSE 159* 230* 375*    Recent Labs Lab 09/27/14 1230 09/28/14 0456 09/30/14 0546  HGB 14.8 14.2 14.3  HCT 45.5 43.1 46.3  WBC 12.9* 10.8* 14.4*  PLT 282 233 244   Ct Soft Tissue Neck Wo Contrast  09/30/2014   CLINICAL DATA:  Initial evaluation neck pain, mild difficulty swallowing, symptoms  started 2 weeks ago with dry throat and bilateral neck fullness, elevated white blood count, rising creatinine and acute renal failure  EXAM: CT NECK WITHOUT CONTRAST  TECHNIQUE: Multidetector CT imaging of the neck was performed following the standard protocol without intravenous contrast.  COMPARISON:  None.  FINDINGS: Visualized intracranial structures normal. Epiglottis normal. No mucosal lesions identified. Larynx is normal. Thyroid is normal. Thoracic inlet is normal. No supraclavicular adenopathy. Paratracheal lymph node identified measuring 13 mm in short axis but demonstrating prominent normal fatty hilum. The appearance suggests a reactive lymph node. Lung apices clear.  Major salivary glands normal and symmetric. Minimal right and moderate left carotid artery calcification. No evidence of deep space mass. No significant cervical adenopathy.  Mild grade 1 retrolisthesis of C3 on C4 with disc bulge at this level. Mild degenerative disc disease throughout the levels of the mid to lower cervical spine. No significant inflammatory change in the visualized portions of the sinuses.  IMPRESSION: No acute abnormalities to account for the patient's symptoms.   Electronically Signed   By: Skipper Cliche M.D.   On: 09/30/2014 12:30    ASSESSMENT / PLAN:  Acute Hypoxic respiratory failure AECOPD GOLD D COPD w/ some reversibility (Pre BD FEV1 49%-->61% post) Acute sinusitis  laryngopharyngeal reflux disease (LPR)  R/o CAP  Nodular pulmonary infiltrates - New or markedly increased since summer. Not toxic enough to think of miliary TB. Viral bronchiolitis might do this. Keep lymphangitic CA or vasculitis in mind.  Obesity w/ PFTs suggesting component restrictive lung disease.  Probable OSA H/o pulmonary nodule  GERD  AKI- note renal function suggests getting dry HTN-  Hyperglycemia Hypothyroidism   Discussion Says he is a little better. Doesn't look uncomfortable  Rec Cont supplemental oxygen    Cont flutter and IS Taper steroids as able - Will reduce to Solu 60 to 40 mg daily Continuing zosyn is reasonable; but could transition to oral augmentin at any point -->would treat at least 10d given concern about sinus involvement  F/u RVP cont brovana and budesonide w/ spiriva-->transition back to symbicort and spiriva at d/c Cont nasal hygiene regimen NS-->afrin->flonase  cont GERD rx  CD Baneza Bartoszek, [place] m [phone] p  [phone]  After 3:00PM [phone]  09/30/2014, 12:55 PM

## 2014-09-30 NOTE — Progress Notes (Addendum)
ANTIBIOTIC CONSULT NOTE - Follow up  Pharmacy Consult for Zosyn, renal-adjustment of antibiotics Indication: pneumonia  No Known Allergies  Patient Measurements: Height: 6' 0.83" (185 cm) Weight: (!) 302 lb 15.4 oz (137.423 kg) IBW/kg (Calculated) : 79.52  Vital Signs: Temp: 97.5 F (36.4 C) (02/06 1354) Temp Source: Oral (02/06 1354) BP: 118/58 mmHg (02/06 1354) Pulse Rate: 96 (02/06 1354) Intake/Output from previous day: 02/05 0701 - 02/06 0700 In: 1122.5 [P.O.:1060; IV Piggyback:62.5] Out: 2300 [Urine:2300] Intake/Output from this shift: Total I/O In: 580 [P.O.:580] Out: 250 [Urine:250]  Labs:  Recent Labs  09/28/14 0456 09/30/14 0546  WBC 10.8* 14.4*  HGB 14.2 14.3  PLT 233 244  CREATININE 1.45* 1.51*   Estimated Creatinine Clearance: 72.7 mL/min (by C-G formula based on Cr of 1.51). No results for input(s): VANCOTROUGH, VANCOPEAK, VANCORANDOM, GENTTROUGH, GENTPEAK, GENTRANDOM, TOBRATROUGH, TOBRAPEAK, TOBRARND, AMIKACINPEAK, AMIKACINTROU, AMIKACIN in the last 72 hours.   Microbiology: Recent Results (from the past 720 hour(s))  Culture, blood (routine x 2) Call MD if unable to obtain prior to antibiotics being given     Status: None (Preliminary result)   Collection Time: 09/27/14  5:31 PM  Result Value Ref Range Status   Specimen Description BLOOD LEFT HAND  Final   Special Requests BOTTLES DRAWN AEROBIC AND ANAEROBIC 5CC  Final   Culture   Final           BLOOD CULTURE RECEIVED NO GROWTH TO DATE CULTURE WILL BE HELD FOR 5 DAYS BEFORE ISSUING A FINAL NEGATIVE REPORT Performed at [facility]    Report Status PENDING  Incomplete  Culture, blood (routine x 2) Call MD if unable to obtain prior to antibiotics being given     Status: None (Preliminary result)   Collection Time: 09/27/14  5:38 PM  Result Value Ref Range Status   Specimen Description BLOOD RIGHT HAND  Final   Special Requests BOTTLES DRAWN AEROBIC AND ANAEROBIC 5CC  Final   Culture    Final           BLOOD CULTURE RECEIVED NO GROWTH TO DATE CULTURE WILL BE HELD FOR 5 DAYS BEFORE ISSUING A FINAL NEGATIVE REPORT Performed at [facility]    Report Status PENDING  Incomplete  Culture, sputum-assessment     Status: None   Collection Time: 09/27/14  7:12 PM  Result Value Ref Range Status   Specimen Description SPUTUM  Final   Special Requests NONE  Final   Sputum evaluation   Final    THIS SPECIMEN IS ACCEPTABLE. RESPIRATORY CULTURE REPORT TO FOLLOW.   Report Status 09/27/2014 FINAL  Final  Culture, respiratory (NON-Expectorated)     Status: None (Preliminary result)   Collection Time: 09/27/14  7:12 PM  Result Value Ref Range Status   Specimen Description SPUTUM  Final   Special Requests NONE  Final   Gram Stain   Final    ABUNDANT WBC PRESENT, PREDOMINANTLY PMN RARE SQUAMOUS EPITHELIAL CELLS PRESENT NO ORGANISMS SEEN Performed at [facility]    Culture   Final    NORMAL OROPHARYNGEAL FLORA Performed at [facility]    Report Status PENDING  Incomplete   Assessment: 64 yo male tx from [facility] with SOB x several weeks and hypoxia. Diagnosed with PNA by PCP las week and started on Avelox. CXR shows persistent PNA, therefore, plan for admission to [facility] consulted to dose vancomycin and zosyn.  2/3 >> Vancomycin >> 2/4 2/3 >> Zosyn >>  Tmax:  AF WBC: elevated (on steroids) Renal: SCr up slightly to 1.51, CrCl 73CG, 51N PCT 0.12(2/4), <0.1(2/6)  2/3 blood x 2: NGTD 2/3 sputum: Nml flora 2/4 Respiratory virus panel: collected 2/3 flu panel: negative  Goal of Therapy:  Appropriate antibiotic dosing for renal function; eradication of infection  Plan:  Cont Zosyn 3.375g IV Q8H infused over 4hrs. SCr is trending up but would have to increase dramatically to necessitate a change in Zosyn dosage so pharmacy will sign-off Zosyn protocol and follow in the background.   Romeo Rabon, PharmD, pager [phone]. 09/30/2014,2:01  PM.

## 2014-10-01 LAB — GLUCOSE, CAPILLARY: Glucose-Capillary: 353 mg/dL — ABNORMAL HIGH (ref 70–99)

## 2014-10-01 MED ORDER — POLYETHYLENE GLYCOL 3350 17 G PO PACK: 17.0000 g | PACK | Freq: Every day | ORAL | Status: DC

## 2014-10-01 MED ORDER — POLYETHYLENE GLYCOL 3350 17 G PO PACK: 17.0000 g | PACK | Freq: Every day | ORAL | Status: DC | PRN

## 2014-10-01 MED ORDER — HYDROCORTISONE 1 % EX CREA
TOPICAL_CREAM | CUTANEOUS | Status: DC | PRN
  Administered 2014-10-01: 22:00:00 via TOPICAL
  Filled 2014-10-01: qty 28

## 2014-10-01 MED ORDER — PREDNISONE 50 MG PO TABS
50.0000 mg | ORAL_TABLET | Freq: Every day | ORAL | Status: DC
  Administered 2014-10-01 – 2014-10-02 (×2): 50 mg via ORAL
  Filled 2014-10-01 (×3): qty 1

## 2014-10-01 NOTE — Progress Notes (Addendum)
Patient ID: Manuel Leonard, male   DOB: 29-Feb-1951, 64 y.o.   MRN: [id number] [facility] PROGRESS NOTE  Manuel Leonard IWL:[id number] DOB: July 15, 1951 DOA: 09/27/2014 PCP: [place], BETTY G, MD  Brief narrative:    64 year old pleasant male with history of COPD, recent outpatient treatment for possible pneumonia who presented to [facility] with worsening shortness of breath, cough productive of clear to yellow sputum, fevers, chills for past few days and progressively worsening in last 24 hours.   Pt was transferred from [place] center. His BP on admission 107/65, HR 112, RR 24-40, T max 99.6 F and oxygen saturation 89% on room air. Blood work showed leukocytosis of 12.9. CXR showed bibasilar infiltrates. He was started on broad spectrum abx, vanco and zosyn.   On 09/27/2014, patient in more respiratory distress with hypoxia and required Ventimask. Pulmonary has seen the patient in consultation. Nebulizer regimen changed to pulmicort and brovana and he felt better. Antibiotics narrowed down to zosyn only and per pulmonary will switch to Augmentin on discharge.   Assessment/Plan:    Principal Problem: Acute respiratory failure with hypoxia / COPD (chronic obstructive pulmonary disease) - Hypoxia likely secondary to COPD and pneumonia. Continue brovana and pulmicort nebs BID. Will switch to symbicort and Spiriva on discharge.  - stop solumedrol and use prednisone PO - CXR repeated 09/28/2014 with findings of persistent interstitial nodular densities which likely reflect pneumonia versus interstitial edema. - patient was on broad spectrum abx, vanco and zosyn since admission. Vancomycin stopped 09/28/2014. Continue zosyn. Change to Augmentin on discharge.  - Respiratory culture shows no organisms, influenza panel by PCR is negative, blood cultures to date are negative. - Continue oxygen support via nasal cannula to keep oxygen saturation above 90%. If hypoxic with ambulation will  nee oxygen on discharge.   Active Problems: Sepsis / CAP (community acquired pneumonia) / Leukocytosis - sepsis criteria met on admission with slight hypotension, tachycardia, tachypnea (low grade fever of 99.6 F but note pt was already on abx so he may not have spiked fever because on antibiotics prior to admission). Additionally, pt had leukocytosis. CXR with evidence of pneumonia. - was on vanco and zosyn since admission. Vancomycin stopped 09/28/2014. - Blood cultures and respiratory cultures so far show no growth. Influenza panel is negative.  Pain in the neck  - no acute findings on CT soft tissue neck. Feels little better this am.  Elevated TSH - TSH slightly elevated, 6.6Patient will need outpatient follow-up TSH.   Acute renal failure - Slight bump in creatinine since the admission, up to 1.51. - likely from vanco - follow up BMP tomorrow am   DVT prophylaxis:  - SCD's bilaterally while in hospital    Code Status: Full.  Family Communication: plan of care discussed with the patient and his family at the bedside  Disposition Home once he feels better. Still short of breath, weak.   IV access:  Peripheral IV  Procedures and diagnostic studies:    Dg Chest 2 View 09/27/2014 Persistent bilateral infiltrates or edema.   Dg Chest Port 1 View 09/28/2014 Persistent diffusely increased interstitial-nodular densities which likely reflect pneumonia or less likely interstitial edema. The findings are stable since yesterday's study but have dramatically increased since the study of September 04, 2014 and the CT of January 28th, 2016.   Ct Soft Tissue Neck Wo Contrast 09/30/2014 No acute abnormalities to account for the patient's symptoms.     Medical Consultants:  Pulmonary  Other Consultants:  None   IAnti-Infectives:   Vanco 09/27/2014 --> 09/28/2014  Zosyn 09/27/2014 -->   Leisa Lenz, MD  [facility] Pager [phone]  If 7PM-7AM, please contact  night-coverage [email or web address] Password [facility] 10/01/2014, 12:38 PM   LOS: 4 days    HPI/Subjective: No acute overnight events.  Objective: Filed Vitals:   09/30/14 2030 09/30/14 2248 10/01/14 0540 10/01/14 0754  BP:  119/57 116/58   Pulse:  103 98   Temp:  98.6 F (37 C) 98.5 F (36.9 C)   TempSrc:  Oral Oral   Resp:  22 22   Height:      Weight:      SpO2: 94% 95% 95% 98%    Intake/Output Summary (Last 24 hours) at 10/01/14 1238 Last data filed at 10/01/14 0541  Gross per 24 hour  Intake    570 ml  Output   2550 ml  Net  -1980 ml    Exam:   General:  Pt is alert, sitting in bed, not in distress  Cardiovascular: RRR, S1/S2 (+)  Respiratory: mild wheezing in upper lung lobes, no rhonchi   Abdomen: Soft, non tender, non distended, bowel sounds present  Extremities: No edema, pulses palpable   Neuro: Grossly non focal, good strength in UE and LE bilaterally   Data Reviewed: Basic Metabolic Panel:  Recent Labs Lab 09/27/14 1230 09/28/14 0456 09/30/14 0546  NA 137 137 137  K 3.8 4.8 5.1  CL 99 101 98  CO2 32 25 32  GLUCOSE 159* 230* 375*  BUN 18 27* 33*  CREATININE 1.00 1.45* 1.51*  CALCIUM 8.6 8.5 8.9   Liver Function Tests:  Recent Labs Lab 09/28/14 0456  AST 34  ALT 36  ALKPHOS 67  BILITOT 0.9  PROT 7.5  ALBUMIN 3.2*   No results for input(s): LIPASE, AMYLASE in the last 168 hours. No results for input(s): AMMONIA in the last 168 hours. CBC:  Recent Labs Lab 09/27/14 1230 09/28/14 0456 09/30/14 0546  WBC 12.9* 10.8* 14.4*  NEUTROABS 8.1*  --   --   HGB 14.8 14.2 14.3  HCT 45.5 43.1 46.3  MCV 91.5 91.7 95.5  PLT 282 233 244   Cardiac Enzymes:  Recent Labs Lab 09/27/14 1230  TROPONINI <0.03   BNP: Invalid input(s): POCBNP CBG:  Recent Labs Lab 09/28/14 0803 09/29/14 0822 09/30/14 0724 10/01/14 0754  GLUCAP 198* 228* 358* 353*    Culture, blood (routine x 2) Call MD if unable to obtain prior to antibiotics being  given     Status: None (Preliminary result)   Collection Time: 09/27/14  5:31 PM  Result Value Ref Range Status   Specimen Description BLOOD LEFT HAND  Final   Culture   Final           BLOOD CULTURE RECEIVED NO GROWTH TO DATE    Report Status PENDING  Incomplete  Culture, blood (routine x 2) Call MD if unable to obtain prior to antibiotics being given     Status: None (Preliminary result)   Collection Time: 09/27/14  5:38 PM  Result Value Ref Range Status   Specimen Description BLOOD RIGHT HAND  Final   Culture   Final           BLOOD CULTURE RECEIVED NO GROWTH TO DATE   Report Status PENDING  Incomplete  Culture, sputum-assessment     Status: None   Collection Time: 09/27/14  7:12 PM  Result Value Ref Range Status   Specimen Description SPUTUM  Final   Special Requests NONE  Final   Sputum evaluation   Final    THIS SPECIMEN IS ACCEPTABLE. RESPIRATORY CULTURE REPORT TO FOLLOW.   Report Status 09/27/2014 FINAL  Final  Culture, respiratory (NON-Expectorated)     Status: None   Collection Time: 09/27/14  7:12 PM  Result Value Ref Range Status   Specimen Description SPUTUM  Final   Special Requests NONE  Final   Gram Stain   Final   Culture   Final    NORMAL OROPHARYNGEAL FLORA   Report Status 09/30/2014 FINAL  Final     Scheduled Meds: . arformoterol  15 mcg Nebulization Q12H  . aspirin EC  81 mg Oral Daily  . budesonide (PULMICORT) nebulizer solution  0.5 mg Nebulization Q12H  . [START ON 10/06/2014] fluticasone  2 spray Each Nare BID   Followed by  . [START ON 10/11/2014] fluticasone  2 spray Each Nare Daily  . loratadine  10 mg Oral Daily  . magic mouthwash  2 mL Oral QID  . pantoprazole  40 mg Oral BID AC  . piperacillin-tazobactam   3.375 g Intravenous Q8H  . predniSONE  50 mg Oral Q breakfast  . tiotropium  18 mcg Inhalation Daily  . zolpidem  5 mg Oral QHS

## 2014-10-01 NOTE — Progress Notes (Signed)
Name: Julies Carmickle MRN: [id number] DOB: 01-09-1951    ADMISSION DATE:  09/27/2014 CONSULTATION DATE:  2/4  REFERRING MD :  Charlies Silvers   CHIEF COMPLAINT:  Hypoxia and COPD   BRIEF PATIENT DESCRIPTION:  64 year old male w/ GOLD D COPD admitted on 2/3 w ~ 6-7 week h/o on-going PND, sinus congestion, cough, wheeze. Treated at least twice in the out-pt setting w/out improvement. Decided to go to ER when over the last 5d PTA had worsening dyspnea. Admitted to medical svc w/ working dx of CAP and AECOPD. PCCM asked to see on 2/4 to assist w/care    SIGNIFICANT EVENTS   Subjective: Not coughing, Feels definitely better here, on O2  STUDIES:  01/2014 PFTs: FEV1  PreBD 49%-->post BD 69% ECHO 7/6>AU I diastolic dysfxn, RV size was nml, RA size was nml, LV FXN 55-60% SUBJECTIVE: not in acute distress   VITAL SIGNS: Temp:  [97.5 F (36.4 C)-98.6 F (37 C)] 98.5 F (36.9 C) (02/07 0540) Pulse Rate:  [96-107] 98 (02/07 0540) Resp:  [16-22] 22 (02/07 0540) BP: (116-119)/(57-58) 116/58 mmHg (02/07 0540) SpO2:  [92 %-98 %] 98 % (02/07 0754) Room air  PHYSICAL EXAMINATION: General:  Sitting up in chair. No acute distress  Neuro:  Awake, alert, no focal def  HEENT:  Neck large, NCAT,  Cardiovascular: rrr   Lungs:  Clear, distant, unlabored Abdomen:  Obese + bowel sounds  Musculoskeletal:  Intact  Skin:  LE swelling    Recent Labs Lab 09/27/14 1230 09/28/14 0456 09/30/14 0546  NA 137 137 137  K 3.8 4.8 5.1  CL 99 101 98  CO2 32 25 32  BUN 18 27* 33*  CREATININE 1.00 1.45* 1.51*  GLUCOSE 159* 230* 375*    Recent Labs Lab 09/27/14 1230 09/28/14 0456 09/30/14 0546  HGB 14.8 14.2 14.3  HCT 45.5 43.1 46.3  WBC 12.9* 10.8* 14.4*  PLT 282 233 244   Ct Soft Tissue Neck Wo Contrast  09/30/2014   CLINICAL DATA:  Initial evaluation neck pain, mild difficulty swallowing, symptoms started 2 weeks ago with dry throat and bilateral neck fullness, elevated white blood count, rising  creatinine and acute renal failure  EXAM: CT NECK WITHOUT CONTRAST  TECHNIQUE: Multidetector CT imaging of the neck was performed following the standard protocol without intravenous contrast.  COMPARISON:  None.  FINDINGS: Visualized intracranial structures normal. Epiglottis normal. No mucosal lesions identified. Larynx is normal. Thyroid is normal. Thoracic inlet is normal. No supraclavicular adenopathy. Paratracheal lymph node identified measuring 13 mm in short axis but demonstrating prominent normal fatty hilum. The appearance suggests a reactive lymph node. Lung apices clear.  Major salivary glands normal and symmetric. Minimal right and moderate left carotid artery calcification. No evidence of deep space mass. No significant cervical adenopathy.  Mild grade 1 retrolisthesis of C3 on C4 with disc bulge at this level. Mild degenerative disc disease throughout the levels of the mid to lower cervical spine. No significant inflammatory change in the visualized portions of the sinuses.  IMPRESSION: No acute abnormalities to account for the patient's symptoms.   Electronically Signed   By: Skipper Cliche M.D.   On: 09/30/2014 12:30    ASSESSMENT / PLAN:  Acute Hypoxic respiratory failure AECOPD GOLD D COPD w/ some reversibility (Pre BD FEV1 49%-->61% post) Acute sinusitis  laryngopharyngeal reflux disease (LPR) R/o CAP  Nodular pulmonary infiltrates - New or markedly increased since summer. Not toxic enough to think of miliary TB. Viral bronchiolitis  might do this. Keep lymphangitic CA or vasculitis in mind.  Obesity w/ PFTs suggesting component restrictive lung disease.  Probable OSA H/o pulmonary nodule  GERD  AKI- note renal function suggests getting dry HTN-  Hyperglycemia Hypothyroidism   Discussion  He is probably nearing baseline. May have been viral pneumonitis exacerbating COPD Needs f/u in a week with his primary physician to get a CXR, with 1 week of augmentin,  Rec as of  2/7 Cont supplemental oxygen as at home Cont flutter and IS Taper steroids as able - Suggest perdnisone 40 mg daily, to taper off over 1 week Transition to oral augmentin at any point -->would treat at least 7d given concern about sinus involvement  F/u RVP cont brovana and budesonide w/ spiriva-->transition back to symbicort and spiriva at d/c Cont nasal hygiene regimen NS-->afrin->flonase  cont GERD rx  CD Young, [place] m [phone] p  [phone]  After 3:00PM [phone]  10/01/2014, 1:47 PM

## 2014-10-02 ENCOUNTER — Telehealth: Payer: Self-pay | Admitting: Emergency Medicine

## 2014-10-02 DIAGNOSIS — R918 Other nonspecific abnormal finding of lung field: Secondary | ICD-10-CM

## 2014-10-02 LAB — BASIC METABOLIC PANEL
Anion gap: 10 (ref 5–15)
BUN: 32 mg/dL — ABNORMAL HIGH (ref 6–23)
CALCIUM: 8.9 mg/dL (ref 8.4–10.5)
CO2: 35 mmol/L — AB (ref 19–32)
Chloride: 92 mmol/L — ABNORMAL LOW (ref 96–112)
Creatinine, Ser: 1.43 mg/dL — ABNORMAL HIGH (ref 0.50–1.35)
GFR, EST AFRICAN AMERICAN: 59 mL/min — AB (ref >90)
GFR, EST NON AFRICAN AMERICAN: 51 mL/min — AB (ref >90)
Glucose, Bld: 344 mg/dL — ABNORMAL HIGH (ref 70–99)
Potassium: 4.8 mmol/L (ref 3.5–5.1)
SODIUM: 137 mmol/L (ref 135–145)

## 2014-10-02 LAB — PROCALCITONIN: Procalcitonin: <0.1 ng/mL

## 2014-10-02 MED ORDER — PREDNISONE 20 MG PO TABS
30.0000 mg | ORAL_TABLET | Freq: Every day | ORAL | Status: DC
  Filled 2014-10-02: qty 1

## 2014-10-02 MED ORDER — GUAIFENESIN-DM 100-10 MG/5ML PO SYRP: 5.0000 mL | ORAL_SOLUTION | ORAL | Status: DC | PRN

## 2014-10-02 MED ORDER — LORATADINE 10 MG PO TABS: 10.0000 mg | ORAL_TABLET | Freq: Every day | ORAL | Status: DC

## 2014-10-02 MED ORDER — PANTOPRAZOLE SODIUM 40 MG PO TBEC: 40.0000 mg | DELAYED_RELEASE_TABLET | Freq: Two times a day (BID) | ORAL | Status: DC

## 2014-10-02 MED ORDER — POLYETHYLENE GLYCOL 3350 17 G PO PACK: 17.0000 g | PACK | Freq: Every day | ORAL | Status: DC | PRN

## 2014-10-02 MED ORDER — ALBUTEROL SULFATE (2.5 MG/3ML) 0.083% IN NEBU: 2.5000 mg | INHALATION_SOLUTION | RESPIRATORY_TRACT | Status: DC | PRN

## 2014-10-02 MED ORDER — BUDESONIDE-FORMOTEROL FUMARATE 160-4.5 MCG/ACT IN AERO
2.0000 | INHALATION_SPRAY | Freq: Two times a day (BID) | RESPIRATORY_TRACT | Status: DC
Start: 2014-10-02 — End: 2021-07-08

## 2014-10-02 MED ORDER — AMOXICILLIN-POT CLAVULANATE 875-125 MG PO TABS: 1.0000 | ORAL_TABLET | Freq: Two times a day (BID) | ORAL | Status: DC

## 2014-10-02 NOTE — Discharge Instructions (Signed)

## 2014-10-02 NOTE — Progress Notes (Signed)
Patient is alert and oriented, vital signs are stable, oxygen removed and got patient up and walked o2 sats down to 90% and bounce back up to 99%, patient to follow up with NP this Friday 10-06-12, prescriptions given and discharge instructions reviewed with patient, patient's spouse and patient's brother Neta Mends RN 10-02-2014 11:50am

## 2014-10-02 NOTE — Discharge Summary (Signed)
Physician Discharge Summary  Manuel Leonard VXB:[id number] DOB: 1950-11-03 DOA: 09/27/2014  PCP: Martinique, BETTY G, MD  Admit date: 09/27/2014 Discharge date: 10/02/2014  Recommendations for Outpatient Follow-up:  1. Continue Augmentin as prescribed 2. Needs follow up with pulmonary,t hey will sch appt; needs follow up CT in regards to previously seen pulmonary nodule 3. Needs repeat CXR for resolution of pneumonia  4. TSH follow up outpt in 1 month from discharge   Discharge Diagnoses:  Principal Problem:   Acute respiratory failure with hypoxia Active Problems:   CAP (community acquired pneumonia)   COPD (chronic obstructive pulmonary disease)   Dyslipidemia   Leukocytosis   Sepsis   COPD exacerbation    Discharge Condition: stable   Diet recommendation: as tolerated   History of present illness:   64 year old pleasant male with history of COPD, recent outpatient treatment for possible pneumonia who presented to [facility] with worsening shortness of breath, cough productive of clear to yellow sputum, fevers, chills for past few days and progressively worsening in last 24 hours.   Pt was transferred from [facility]. His BP on admission 107/65, HR 112, RR 24-40, T max 99.6 F and oxygen saturation 89% on room air. Blood work showed leukocytosis of 12.9. CXR showed bibasilar infiltrates. He was started on broad spectrum abx, vanco and zosyn.   On 09/27/2014, patient in more respiratory distress with hypoxia and required Ventimask. Pulmonary has seen the patient in consultation. Nebulizer regimen changed to pulmicort and brovana and he felt better. Antibiotics narrowed down to zosyn only and per pulmonary made switch to Augmentin on discharge.   Assessment/Plan:    Principal Problem: Acute respiratory failure with hypoxia / COPD (chronic obstructive pulmonary disease) - Hypoxia likely secondary to COPD and pneumonia. Continue brovana and pulmicort nebs BID. Will switch  to symbicort and Spiriva on discharge.  - stopped steroids prior to discharge  - CXR repeated 09/28/2014 with findings of persistent interstitial nodular densities which likely reflect pneumonia versus interstitial edema. - patient was on broad spectrum abx, vanco and zosyn since admission. Vancomycin stopped 09/28/2014. Was on zosyn. Changed to Augmentin on discharge.  - Respiratory culture showed no organisms, influenza panel by PCR negative, blood cultures to date negative.  Active Problems: Sepsis / CAP (community acquired pneumonia) / Leukocytosis - sepsis criteria met on admission with slight hypotension, tachycardia, tachypnea (low grade fever of 99.6 F but note pt was already on abx so he may not have spiked fever because on antibiotics prior to admission). Additionally, pt had leukocytosis. CXR with evidence of pneumonia. - was on vanco and zosyn since admission. Vancomycin stopped 09/28/2014. - Augmentin on discharge as prescribed. - Blood cultures and respiratory cultures showed no growth. Influenza panel negative.  Pain in the neck  - no acute findings on CT soft tissue neck. - better this am   Elevated TSH - TSH slightly elevated, 6.6Patient will need outpatient follow-up TSH. Communicated with pt and he understands.  Acute renal failure - Slight bump in creatinine since the admission, up to 1.51. - likely from vanco - creatinine improved to 1.4 prior to discharge    DVT prophylaxis:  - SCD's bilaterally while in hospital    Code Status: Full.  Family Communication: plan of care discussed with the patient and his family at the bedside     IV access:  Peripheral IV  Procedures and diagnostic studies:   Dg Chest 2 View 09/27/2014 Persistent bilateral infiltrates or edema.  Dg Chest Port 1 View 09/28/2014 Persistent diffusely increased interstitial-nodular densities which likely reflect pneumonia or less likely interstitial edema. The findings are  stable since yesterday's study but have dramatically increased since the study of September 04, 2014 and the CT of January 28th, 2016.   Ct Soft Tissue Neck Wo Contrast 09/30/2014 No acute abnormalities to account for the patient's symptoms.   Medical Consultants:  Pulmonary  Other Consultants:  None   IAnti-Infectives:   Vanco 09/27/2014 --> 09/28/2014  Zosyn 09/27/2014 --> 10/02/2014  Signed:  Leisa Lenz, MD  [facility] 10/02/2014, 10:18 AM  Pager #: [phone]  Discharge Exam: Filed Vitals:   10/02/14 0633  BP: 142/80  Pulse: 103  Temp: 98.1 F (36.7 C)  Resp: 22   Filed Vitals:   10/01/14 1400 10/01/14 2008 10/01/14 2200 10/02/14 0633  BP: 145/97  148/72 142/80  Pulse: 100  105 103  Temp: 98.6 F (37 C)  98 F (36.7 C) 98.1 F (36.7 C)  TempSrc: Oral  Oral Oral  Resp: '22  22 22  ' Height:      Weight:      SpO2: 97% 93% 93% 93%    General: Pt is alert, follows commands appropriately, not in acute distress Cardiovascular: Regular rate and rhythm, S1/S2 +, no murmurs Respiratory: Clear to auscultation bilaterally, no wheezing, no crackles, no rhonchi Abdominal: Soft, non tender, non distended, bowel sounds +, no guarding Extremities: no edema, no cyanosis, pulses palpable bilaterally DP and PT Neuro: Grossly nonfocal  Discharge Instructions  Discharge Instructions    Call MD for:  difficulty breathing, headache or visual disturbances    Complete by:  As directed      Call MD for:  persistant nausea and vomiting    Complete by:  As directed      Call MD for:  severe uncontrolled pain    Complete by:  As directed      Diet - low sodium heart healthy    Complete by:  As directed      Increase activity slowly    Complete by:  As directed             Medication List    STOP taking these medications        albuterol (2.5 MG/3ML) 0.083% NEBU 3 mL, albuterol (5 MG/ML) 0.5% NEBU 0.5 mL  Replaced by:  albuterol (2.5 MG/3ML) 0.083%  nebulizer solution      TAKE these medications        albuterol (2.5 MG/3ML) 0.083% nebulizer solution  Commonly known as:  PROVENTIL  Take 3 mLs (2.5 mg total) by nebulization every 4 (four) hours as needed for shortness of breath.     amoxicillin-clavulanate 875-125 MG per tablet  Commonly known as:  AUGMENTIN  Take 1 tablet by mouth 2 (two) times daily.     aspirin EC 81 MG tablet  Take 81 mg by mouth daily.     budesonide-formoterol 160-4.5 MCG/ACT inhaler  Commonly known as:  SYMBICORT  Inhale 2 puffs into the lungs 2 (two) times daily.     guaiFENesin-dextromethorphan 100-10 MG/5ML syrup  Commonly known as:  ROBITUSSIN DM  Take 5 mLs by mouth every 4 (four) hours as needed for cough.     loratadine 10 MG tablet  Commonly known as:  CLARITIN  Take 1 tablet (10 mg total) by mouth daily.     pantoprazole 40 MG tablet  Commonly known as:  PROTONIX  Take 1 tablet (40 mg  total) by mouth 2 (two) times daily before a meal.           Follow-up Information    Follow up with PARRETT,TAMMY, NP On 10/06/2014.   Specialty:  Nurse Practitioner   Why:  945 am    Contact information:   [place] [place] [address] [phone]       Follow up with Martinique, Malka So, MD. Schedule an appointment as soon as possible for a visit in 2 weeks.   Specialty:  Family Medicine   Why:  Follow up appt after recent hospitalization   Contact information:   [place] [place] [address] [phone]        The results of significant diagnostics from this hospitalization (including imaging, microbiology, ancillary and laboratory) are listed below for reference.    Significant Diagnostic Studies: Dg Chest 2 View  09/27/2014   CLINICAL DATA:  Sob 2-3 wks.  Chest tightness started last week.  EXAM: CHEST - 2 VIEW  COMPARISON:  CT 09/21/2014 and earlier studies  FINDINGS: Perihilar and bibasilar nodular interstitial opacities, similar to that seen on prior CT. Heart  size is normal. No effusion. Visualized skeletal structures are unremarkable.  IMPRESSION: Persistent bilateral infiltrates or edema.   Electronically Signed   By: Arne Cleveland M.D.   On: 09/27/2014 13:20   Ct Soft Tissue Neck Wo Contrast  09/30/2014   CLINICAL DATA:  Initial evaluation neck pain, mild difficulty swallowing, symptoms started 2 weeks ago with dry throat and bilateral neck fullness, elevated white blood count, rising creatinine and acute renal failure  EXAM: CT NECK WITHOUT CONTRAST  TECHNIQUE: Multidetector CT imaging of the neck was performed following the standard protocol without intravenous contrast.  COMPARISON:  None.  FINDINGS: Visualized intracranial structures normal. Epiglottis normal. No mucosal lesions identified. Larynx is normal. Thyroid is normal. Thoracic inlet is normal. No supraclavicular adenopathy. Paratracheal lymph node identified measuring 13 mm in short axis but demonstrating prominent normal fatty hilum. The appearance suggests a reactive lymph node. Lung apices clear.  Major salivary glands normal and symmetric. Minimal right and moderate left carotid artery calcification. No evidence of deep space mass. No significant cervical adenopathy.  Mild grade 1 retrolisthesis of C3 on C4 with disc bulge at this level. Mild degenerative disc disease throughout the levels of the mid to lower cervical spine. No significant inflammatory change in the visualized portions of the sinuses.  IMPRESSION: No acute abnormalities to account for the patient's symptoms.   Electronically Signed   By: Skipper Cliche M.D.   On: 09/30/2014 12:30   Ct Angio Chest Pe W/cm &/or Wo Cm  09/21/2014   CLINICAL DATA:  64 year old with productive cough and shortness of breath for 1 month. History of smoking.  EXAM: CT ANGIOGRAPHY CHEST WITH CONTRAST  TECHNIQUE: Multidetector CT imaging of the chest was performed using the standard protocol during bolus administration of intravenous contrast.  Multiplanar CT image reconstructions and MIPs were obtained to evaluate the vascular anatomy.  CONTRAST:  162m OMNIPAQUE IOHEXOL 350 MG/ML SOLN  COMPARISON:  Chest CT 01/26/2014  FINDINGS: Negative for pulmonary embolism. There has been interval enlargement of mediastinal lymph nodes. Subcarinal lymph node measures 1.3 cm in the short axis and previously measured 0.9 cm. There is an anterior mediastinal lymph node on sequence 401, image 22 which measures 1.2 cm in the short axis and previously measured 0.6 cm. Right hilar tissue is also prominent measuring 1.4 cm in the  short axis on sequence 401, image 48. There is no significant pericardial or pleural fluid. No evidence for axillary lymphadenopathy.  Diffuse low-attenuation of the liver suggests hepatic steatosis. Again noted are low-density structures in the right kidney upper pole which probably represent cysts but incompletely evaluated. There is motion artifact on the upper abdominal images.  The trachea and mainstem bronchi are patent. Patient now has diffuse centrilobular nodules throughout both lungs. Most these nodules are very small and slightly irregular. One of the largest pulmonary nodules is located at the right lung base on sequence 406, image 101, measuring up to 8 mm. There are some irregular nodules in the right upper lobe on sequence 406, image 24. In addition, there is mild peribronchial thickening. No large areas of consolidation.  No acute bone abnormality.  Review of the MIP images confirms the above findings.  IMPRESSION: Negative for pulmonary embolism.  Development of chest lymphadenopathy, peribronchial thickening and innumerable small pulmonary nodules. Findings are most compatible with an infectious or inflammatory process associated with bronchial inflammation and bronchitis. No large areas of consolidation at this time.  Hepatic steatosis.   Electronically Signed   By: Markus Daft M.D.   On: 09/21/2014 09:56   Dg Chest Port 1  View  09/28/2014   CLINICAL DATA:  COPD, shortness of breath and wheezing, possible pneumonia ; long history of tobacco use  EXAM: PORTABLE CHEST - 1 VIEW  COMPARISON:  PA and lateral chest of September 27, 2014 and September 04, 2014  FINDINGS: Since the January study the interstitial markings have markedly increased diffusely. There is a subtle nodular pattern bilaterally. No alveolar infiltrates are demonstrated. The cardiac silhouette is top-normal in size. The pulmonary vascularity is not engorged. The trachea is midline. There is no pleural effusion. The bony thorax is unremarkable.  IMPRESSION: Persistent diffusely increased interstitial-nodular densities which likely reflect pneumonia or less likely interstitial edema. The findings are stable since yesterday's study but have dramatically increased since the study of September 04, 2014 and the CT of January 28th, 2016.   Electronically Signed   By: David  Martinique   On: 09/28/2014 12:51    Microbiology: Recent Results (from the past 240 hour(s))  Culture, blood (routine x 2) Call MD if unable to obtain prior to antibiotics being given     Status: None (Preliminary result)   Collection Time: 09/27/14  5:31 PM  Result Value Ref Range Status   Specimen Description BLOOD LEFT HAND  Final   Special Requests BOTTLES DRAWN AEROBIC AND ANAEROBIC 5CC  Final   Culture   Final           BLOOD CULTURE RECEIVED NO GROWTH TO DATE CULTURE WILL BE HELD FOR 5 DAYS BEFORE ISSUING A FINAL NEGATIVE REPORT Performed at [facility]    Report Status PENDING  Incomplete  Culture, blood (routine x 2) Call MD if unable to obtain prior to antibiotics being given     Status: None (Preliminary result)   Collection Time: 09/27/14  5:38 PM  Result Value Ref Range Status   Specimen Description BLOOD RIGHT HAND  Final   Special Requests BOTTLES DRAWN AEROBIC AND ANAEROBIC 5CC  Final   Culture   Final           BLOOD CULTURE RECEIVED NO GROWTH TO DATE CULTURE WILL BE  HELD FOR 5 DAYS BEFORE ISSUING A FINAL NEGATIVE REPORT Performed at [facility]    Report Status PENDING  Incomplete  Culture, sputum-assessment  Status: None   Collection Time: 09/27/14  7:12 PM  Result Value Ref Range Status   Specimen Description SPUTUM  Final   Special Requests NONE  Final   Sputum evaluation   Final    THIS SPECIMEN IS ACCEPTABLE. RESPIRATORY CULTURE REPORT TO FOLLOW.   Report Status 09/27/2014 FINAL  Final  Culture, respiratory (NON-Expectorated)     Status: None   Collection Time: 09/27/14  7:12 PM  Result Value Ref Range Status   Specimen Description SPUTUM  Final   Special Requests NONE  Final   Gram Stain   Final    ABUNDANT WBC PRESENT, PREDOMINANTLY PMN RARE SQUAMOUS EPITHELIAL CELLS PRESENT NO ORGANISMS SEEN Performed at [facility]    Culture   Final    NORMAL OROPHARYNGEAL FLORA Performed at [facility]    Report Status 09/30/2014 FINAL  Final     Labs: Basic Metabolic Panel:  Recent Labs Lab 09/27/14 1230 09/28/14 0456 09/30/14 0546 10/02/14 0610  NA 137 137 137 137  K 3.8 4.8 5.1 4.8  CL 99 101 98 92*  CO2 32 25 32 35*  GLUCOSE 159* 230* 375* 344*  BUN 18 27* 33* 32*  CREATININE 1.00 1.45* 1.51* 1.43*  CALCIUM 8.6 8.5 8.9 8.9   Liver Function Tests:  Recent Labs Lab 09/28/14 0456  AST 34  ALT 36  ALKPHOS 67  BILITOT 0.9  PROT 7.5  ALBUMIN 3.2*   No results for input(s): LIPASE, AMYLASE in the last 168 hours. No results for input(s): AMMONIA in the last 168 hours. CBC:  Recent Labs Lab 09/27/14 1230 09/28/14 0456 09/30/14 0546  WBC 12.9* 10.8* 14.4*  NEUTROABS 8.1*  --   --   HGB 14.8 14.2 14.3  HCT 45.5 43.1 46.3  MCV 91.5 91.7 95.5  PLT 282 233 244   Cardiac Enzymes:  Recent Labs Lab 09/27/14 1230  TROPONINI <0.03   BNP: BNP (last 3 results)  Recent Labs  09/27/14 1230 09/28/14 1200  BNP 55.1 119.0*    ProBNP (last 3 results) No results for input(s): PROBNP in  the last 8760 hours.  CBG:  Recent Labs Lab 09/28/14 0803 09/29/14 0822 09/30/14 0724 10/01/14 0754  GLUCAP 198* 228* 358* 353*    Time coordinating discharge: Over 30 minutes

## 2014-10-02 NOTE — Progress Notes (Signed)
Name: Manuel Leonard MRN: [id number] DOB: 1951-07-16    ADMISSION DATE:  09/27/2014 CONSULTATION DATE:  2/4  REFERRING MD :  Charlies Silvers   CHIEF COMPLAINT:  Hypoxia and COPD   BRIEF PATIENT DESCRIPTION:  64 year old male w/ GOLD D COPD admitted on 2/3 w ~ 6-7 week h/o on-going PND, sinus congestion, cough, wheeze. Treated at least twice in the out-pt setting w/out improvement. Decided to go to ER when over the last 5d PTA had worsening dyspnea. Admitted to medical svc w/ working dx of CAP and AECOPD. PCCM asked to see on 2/4 to assist w/care    SIGNIFICANT EVENTS   Subjective: Not coughing, Feels definitely better here, on O2  STUDIES:  01/2014 PFTs: FEV1  PreBD 49%-->post BD 69% ECHO 5/2>WU I diastolic dysfxn, RV size was nml, RA size was nml, LV FXN 55-60%  SUBJECTIVE: Comfortable currently  VITAL SIGNS: Temp:  [98 F (36.7 C)-98.6 F (37 C)] 98.1 F (36.7 C) (02/08 1324) Pulse Rate:  [100-105] 103 (02/08 0633) Resp:  [22] 22 (02/08 0633) BP: (142-148)/(72-97) 142/80 mmHg (02/08 0633) SpO2:  [93 %-97 %] 93 % (02/08 0633) Room air  PHYSICAL EXAMINATION: General:  Sitting up in bed, no distress HEENT: NCAT PULM: few crackles bases, no wheezing CV: RRR, S1/S2 AB: BS+, soft Ext: warm, acyanotic Neuro: A&OX4, maew   Recent Labs Lab 09/28/14 0456 09/30/14 0546 10/02/14 0610  NA 137 137 137  K 4.8 5.1 4.8  CL 101 98 92*  CO2 25 32 35*  BUN 27* 33* 32*  CREATININE 1.45* 1.51* 1.43*  GLUCOSE 230* 375* 344*    Recent Labs Lab 09/27/14 1230 09/28/14 0456 09/30/14 0546  HGB 14.8 14.2 14.3  HCT 45.5 43.1 46.3  WBC 12.9* 10.8* 14.4*  PLT 282 233 244   Ct Soft Tissue Neck Wo Contrast  09/30/2014   CLINICAL DATA:  Initial evaluation neck pain, mild difficulty swallowing, symptoms started 2 weeks ago with dry throat and bilateral neck fullness, elevated white blood count, rising creatinine and acute renal failure  EXAM: CT NECK WITHOUT CONTRAST  TECHNIQUE:  Multidetector CT imaging of the neck was performed following the standard protocol without intravenous contrast.  COMPARISON:  None.  FINDINGS: Visualized intracranial structures normal. Epiglottis normal. No mucosal lesions identified. Larynx is normal. Thyroid is normal. Thoracic inlet is normal. No supraclavicular adenopathy. Paratracheal lymph node identified measuring 13 mm in short axis but demonstrating prominent normal fatty hilum. The appearance suggests a reactive lymph node. Lung apices clear.  Major salivary glands normal and symmetric. Minimal right and moderate left carotid artery calcification. No evidence of deep space mass. No significant cervical adenopathy.  Mild grade 1 retrolisthesis of C3 on C4 with disc bulge at this level. Mild degenerative disc disease throughout the levels of the mid to lower cervical spine. No significant inflammatory change in the visualized portions of the sinuses.  IMPRESSION: No acute abnormalities to account for the patient's symptoms.   Electronically Signed   By: Skipper Cliche M.D.   On: 09/30/2014 12:30    ASSESSMENT / PLAN:  Acute Hypoxic respiratory failure AECOPD GOLD D COPD w/ some reversibility (Pre BD FEV1 49%-->61% post) Acute sinusitis  laryngopharyngeal reflux disease (LPR) R/o CAP  Nodular pulmonary infiltrates - New or markedly increased since summer. Not toxic enough to think of miliary TB. Viral bronchiolitis might do this. Keep lymphangitic CA or vasculitis in mind.  Obesity w/ PFTs suggesting component restrictive lung disease.  Probable OSA H/o  pulmonary nodule  GERD  Hyperglycemia Hypothyroidism   Discussion  He seems to be nearing baseline.  Hyperglycemia new problem for him.  Rec as of 2/7 Cont supplemental oxygen as at home Prednisone 30 mg daily, to taper off over 5 days Would treat with Augmentin 7 days total Abx (including IV in hospital) Transition back to symbicort and spiriva at d/c Cont nasal hygiene regimen  NS-->afrin->flonase  cont GERD rx Needs Outpatient CXR to follow up nodules   Will make a follow up appointment with Dr. Lamonte Sakai in 2-4 weeks.  Roselie Awkward, MD [facility] Pager: [phone] Cell: [phone] If no response, call [phone]   10/02/2014, 10:46 AM

## 2014-10-02 NOTE — Telephone Encounter (Signed)
Hi   Please make arrangements for f/u with Dr. Lamonte Sakai in the next 4 weeks.   Thanks  B    Attempted to call. No answer, no option to leave message.

## 2014-10-03 LAB — GLUCOSE, CAPILLARY: GLUCOSE-CAPILLARY: 250 mg/dL — AB (ref 70–99)

## 2014-10-03 LAB — CULTURE, BLOOD (ROUTINE X 2)
Culture: NO GROWTH
Culture: NO GROWTH

## 2014-10-06 ENCOUNTER — Encounter: Payer: Self-pay | Admitting: Adult Health

## 2014-10-06 ENCOUNTER — Ambulatory Visit (INDEPENDENT_AMBULATORY_CARE_PROVIDER_SITE_OTHER)
Admission: RE | Admit: 2014-10-06 | Discharge: 2014-10-06 | Disposition: A | Discharge status: Home / Self Care | Payer: BLUE CROSS/BLUE SHIELD | Source: Ambulatory Visit | Attending: Adult Health | Admitting: Adult Health

## 2014-10-06 ENCOUNTER — Ambulatory Visit (INDEPENDENT_AMBULATORY_CARE_PROVIDER_SITE_OTHER): Payer: BLUE CROSS/BLUE SHIELD | Admitting: Adult Health

## 2014-10-06 VITALS — BP 130/74 | HR 115 | Temp 97.6°F | Ht 73.0 in | Wt 328.0 lb

## 2014-10-06 DIAGNOSIS — J189 Pneumonia, unspecified organism: Secondary | ICD-10-CM

## 2014-10-06 DIAGNOSIS — R918 Other nonspecific abnormal finding of lung field: Secondary | ICD-10-CM

## 2014-10-06 DIAGNOSIS — J449 Chronic obstructive pulmonary disease, unspecified: Secondary | ICD-10-CM

## 2014-10-06 LAB — RESPIRATORY VIRUS PANEL
Adenovirus: NEGATIVE
INFLUENZA A: NEGATIVE
INFLUENZA B 1: NEGATIVE
Metapneumovirus: NEGATIVE
PARAINFLUENZA 1 A: NEGATIVE
PARAINFLUENZA 2 A: NEGATIVE
PARAINFLUENZA 3 A: NEGATIVE
RESPIRATORY SYNCYTIAL VIRUS B: NEGATIVE
Respiratory Syncytial Virus A: NEGATIVE
Rhinovirus: NEGATIVE

## 2014-10-06 NOTE — Telephone Encounter (Signed)
Pt is scheduled to see TP today. Nothing further is needed.

## 2014-10-06 NOTE — Progress Notes (Signed)
   Subjective:    Patient ID: [name], male    DOB: 08-19-1951, 64 y.o.   MRN: [id number]  HPI 64 yo smoker (40 pk-yrs), hx HTN, GERD, COPD, renal cysts and hematuria.  Was discovered to have a 2-75mm LLL nodule, probably calcified.   10/06/2014 Post hospital follow-up Patient returns for post hospital follow-up Patient was admitted February 3 through February 8 for acute hypoxic respiratory failure with community-acquired pneumonia and COPD exacerbation, treated by sepsis. He was treated with aggressive IV antibiotics, nebulized bronchodilators and steroids Blood cultures were negative and influenza panel was negative Since discharge he is feeling better back remains somewhat weak. Chest x-ray today shows significant improvement in aeration. Has quit smoking since discharge.  He denies any hemoptysis, chest pain, orthopnea, PND or increased leg swelling.       Review of Systems  Constitutional:   No  weight loss, night sweats,  Fevers, chills, + fatigue, or  lassitude.  HEENT:   No headaches,  Difficulty swallowing,  Tooth/dental problems, or  Sore throat,                No sneezing, itching, ear ache, nasal congestion, post nasal drip,   CV:  No chest pain,  Orthopnea, PND, swelling in lower extremities, anasarca, dizziness, palpitations, syncope.   GI  No heartburn, indigestion, abdominal pain, nausea, vomiting, diarrhea, change in bowel habits, loss of appetite, bloody stools.   Resp:  No chest wall deformity  Skin: no rash or lesions.  GU: no dysuria, change in color of urine, no urgency or frequency.  No flank pain, no hematuria   MS:  No joint pain or swelling.  No decreased range of motion.  No back pain.  Psych:  No change in mood or affect. No depression or anxiety.  No memory loss.                Objective:   Physical Exam  Gen: Pleasant, obese, in no distress,  normal affect  ENT: No lesions,  mouth clear,  oropharynx clear, no postnasal  drip  Neck: No JVD, no TMG, no carotid bruits  Lungs: No use of accessory muscles, clear without rales or rhonchi  Cardiovascular: RRR, heart sounds normal, no murmur or gallops, tr peripheral edema  Musculoskeletal: No deformities, no cyanosis or clubbing  Neuro: alert, non focal  Skin: Warm, no lesions or rashes  CXR 10/06/2014  There has been considerable improvement in the appearance of the pulmonary interstitium consistent with partial response to therapy. Additional follow-up radiographs following anticipated further antibiotic therapy are recommended. Reviewed this study independently .     Assessment & Plan:

## 2014-10-06 NOTE — Assessment & Plan Note (Signed)
Compensated on present regimen   Plan  Continue on Symbicort 2 puffs twice daily, brush rinse and gargle after use Great job on not smoking Follow-up with Dr. Lamonte Sakai in 4-6 weeks with chest x-ray Please contact office for sooner follow up if symptoms do not improve or worsen or seek emergency care

## 2014-10-06 NOTE — Assessment & Plan Note (Signed)
Clinically improving , CXR shows improved aeration   Plan  Continue on Symbicort 2 puffs twice daily, brush rinse and gargle after use Great job on not smoking Follow-up with Dr. Lamonte Sakai in 4-6 weeks with chest x-ray Please contact office for sooner follow up if symptoms do not improve or worsen or seek emergency care

## 2014-10-06 NOTE — Patient Instructions (Signed)
Continue on Symbicort 2 puffs twice daily, brush rinse and gargle after use Great job on not smoking Follow-up with Dr. Lamonte Sakai in 4-6 weeks with chest x-ray Please contact office for sooner follow up if symptoms do not improve or worsen or seek emergency care

## 2014-10-06 NOTE — Assessment & Plan Note (Signed)
Will need follow up CT ~June/July this year.

## 2015-10-08 ENCOUNTER — Ambulatory Visit (INDEPENDENT_AMBULATORY_CARE_PROVIDER_SITE_OTHER): Payer: BLUE CROSS/BLUE SHIELD | Admitting: Adult Health

## 2015-10-08 ENCOUNTER — Encounter: Payer: Self-pay | Admitting: Adult Health

## 2015-10-08 ENCOUNTER — Ambulatory Visit (INDEPENDENT_AMBULATORY_CARE_PROVIDER_SITE_OTHER)
Admission: RE | Admit: 2015-10-08 | Discharge: 2015-10-08 | Disposition: A | Discharge status: Home / Self Care | Payer: BLUE CROSS/BLUE SHIELD | Source: Ambulatory Visit | Attending: Adult Health | Admitting: Adult Health

## 2015-10-08 VITALS — BP 132/84 | HR 96 | Temp 97.6°F | Ht 73.0 in | Wt 313.0 lb

## 2015-10-08 DIAGNOSIS — J449 Chronic obstructive pulmonary disease, unspecified: Secondary | ICD-10-CM

## 2015-10-08 DIAGNOSIS — J189 Pneumonia, unspecified organism: Secondary | ICD-10-CM | POA: Diagnosis not present

## 2015-10-08 DIAGNOSIS — J441 Chronic obstructive pulmonary disease with (acute) exacerbation: Secondary | ICD-10-CM

## 2015-10-08 DIAGNOSIS — R918 Other nonspecific abnormal finding of lung field: Secondary | ICD-10-CM | POA: Diagnosis not present

## 2015-10-08 MED ORDER — PREDNISONE 10 MG PO TABS: ORAL_TABLET | ORAL | Status: DC

## 2015-10-08 NOTE — Assessment & Plan Note (Signed)
jj

## 2015-10-08 NOTE — Patient Instructions (Addendum)
Prednisone taper over next week.  Mucinex DM Twice daily As needed  Cough/congestion .  Follow up Dr. Lamonte Sakai  In 6 weeks and As needed   Work on not smoking.

## 2015-10-08 NOTE — Progress Notes (Signed)
   Subjective:    Patient ID: [name], male    DOB: February 10, 1951, 65 y.o.   MRN: [id number]  HPI    Review of Systems     Objective:   Physical Exam        Assessment & Plan:    Subjective:    Patient ID: Rajohn Kinloch, male    DOB: 07/14/1951,   HPI 65 yo smoker (40 pk-yrs), hx HTN, GERD, COPD, renal cysts and hematuria.  10/08/2015 Acute OV : PNA  Pt presents for an acute office visit.  Complains of prod cough with yellow sinus in the mornings, chest tightness/congestion, nausea during the mornings, SOB and wheezing with activity for 1 week. He was seen at urgent care last week. Told he had PNA. Tx w/ Augmentin .  Congestion is some better but still cough and wheezing persist.  Has restarted smoking  He denies any hemoptysis, chest pain, orthopnea, PND or increased leg swelling. Smoking cessation discussed.  Seen 1 year ago with similar symptoms from ER follow up , CT chest done at that time showed diffuse pulmonary nodules. He did not follow up for planned serial CT chest .  CXR showed COPD changes today .      Review of Systems  Constitutional:   No  weight loss, night sweats,  Fevers, chills, + fatigue, or  lassitude.  HEENT:   No headaches,  Difficulty swallowing,  Tooth/dental problems, or  Sore throat,                No sneezing, itching, ear ache, nasal congestion, post nasal drip,   CV:  No chest pain,  Orthopnea, PND, swelling in lower extremities, anasarca, dizziness, palpitations, syncope.   GI  No heartburn, indigestion, abdominal pain, nausea, vomiting, diarrhea, change in bowel habits, loss of appetite, bloody stools.   Resp:  No chest wall deformity  Skin: no rash or lesions.  GU: no dysuria, change in color of urine, no urgency or frequency.  No flank pain, no hematuria   MS:  No joint pain or swelling.  No decreased range of motion.  No back pain.  Psych:  No change in mood or affect. No depression or anxiety.  No memory  loss.                Objective:   Physical Exam  Filed Vitals:   10/08/15 1427  BP: 132/84  Pulse: 96  Temp: 97.6 F (36.4 C)  TempSrc: Oral  Height: 6\' 1"  (1.854 m)  Weight: 313 lb (141.976 kg)  SpO2: 96%     Gen: Pleasant, obese, in no distress,  normal affect  ENT: No lesions,  mouth clear,  oropharynx clear, no postnasal drip  Neck: No JVD, no TMG, no carotid bruits  Lungs: No use of accessory muscles, clear without rales or rhonchi  Cardiovascular: RRR, heart sounds normal, no murmur or gallops, tr peripheral edema  Musculoskeletal: No deformities, no cyanosis or clubbing  Neuro: alert, non focal  Skin: Warm, no lesions or rashes  CXR 10/08/2015    COPD changes  reivewed independently   Assessment & Plan:

## 2015-10-15 NOTE — Assessment & Plan Note (Signed)
Recently dx CAP , tx w/ abx.  CXR without PNA noted.  No further abx at this time.

## 2015-10-15 NOTE — Assessment & Plan Note (Signed)
Needs follow up CT chest in smoker, will wait until follow up ov in 6 weeks with Dr. Lamonte Sakai  Let acute flare resolve, get CT prior to follow up ov.

## 2015-10-15 NOTE — Assessment & Plan Note (Signed)
Slow to resolve flare  Plan  Prednisone taper over next week.  Mucinex DM Twice daily As needed  Cough/congestion .  Follow up Dr. Lamonte Sakai  In 6 weeks and As needed   Work on not smoking.

## 2015-11-19 ENCOUNTER — Ambulatory Visit: Payer: BLUE CROSS/BLUE SHIELD | Admitting: Emergency Medicine

## 2015-12-14 ENCOUNTER — Ambulatory Visit: Payer: BLUE CROSS/BLUE SHIELD | Admitting: Emergency Medicine

## 2015-12-21 DIAGNOSIS — E785 Hyperlipidemia, unspecified: Secondary | ICD-10-CM

## 2015-12-21 HISTORY — DX: Hyperlipidemia, unspecified: E78.5

## 2015-12-28 ENCOUNTER — Encounter: Payer: Self-pay | Admitting: Emergency Medicine

## 2015-12-28 ENCOUNTER — Ambulatory Visit (INDEPENDENT_AMBULATORY_CARE_PROVIDER_SITE_OTHER): Payer: BLUE CROSS/BLUE SHIELD | Admitting: Emergency Medicine

## 2015-12-28 VITALS — BP 112/80 | HR 107 | Wt 316.0 lb

## 2015-12-28 DIAGNOSIS — J449 Chronic obstructive pulmonary disease, unspecified: Secondary | ICD-10-CM | POA: Diagnosis not present

## 2015-12-28 DIAGNOSIS — Z720 Tobacco use: Secondary | ICD-10-CM

## 2015-12-28 DIAGNOSIS — R911 Solitary pulmonary nodule: Secondary | ICD-10-CM

## 2015-12-28 MED ORDER — PREDNISONE 10 MG PO TABS: ORAL_TABLET | ORAL | Status: DC

## 2015-12-28 MED ORDER — ALBUTEROL SULFATE HFA 108 (90 BASE) MCG/ACT IN AERS: 2.0000 | INHALATION_SPRAY | Freq: Four times a day (QID) | RESPIRATORY_TRACT | Status: DC | PRN

## 2015-12-28 MED ORDER — TIOTROPIUM BROMIDE MONOHYDRATE 2.5 MCG/ACT IN AERS
2.0000 | INHALATION_SPRAY | Freq: Every day | RESPIRATORY_TRACT | Status: DC
Start: 2015-12-28 — End: 2017-07-08

## 2015-12-28 NOTE — Assessment & Plan Note (Signed)
Discussed cessation with him today. We will set a goal of cutting and 10 cigarettes daily by our next visit.

## 2015-12-28 NOTE — Assessment & Plan Note (Signed)
Repeat CT scan of the chest now. Based on the appearance we will determine what had a workup needs to proceed.

## 2015-12-28 NOTE — Patient Instructions (Addendum)
We Will repeat your CT scan of the chest without contrast to evaluate your pulmonary nodules Please continue Symbicort twice a day Please start Spiriva Respimat 2 puffs once a day Please start albuterol 2 puffs to be used as needed for shortness of breath or chest tightness We need to work on decreasing your smoking. Please cut down to 10 cigarettes daily by your next visit. Take prednisone as directed Follow with Dr Lamonte Sakai in 1 month or next available.

## 2015-12-28 NOTE — Assessment & Plan Note (Signed)
He continues to have everyday symptoms. I'll like to add Spiriva to his existing Symbicort. Complains of chest tightness and we will treat with a prednisone taper. Continue albuterol when necessary. Smoking cessation discussed with him in detail

## 2015-12-28 NOTE — Progress Notes (Signed)
Subjective:    Patient ID: Manuel Leonard, male    DOB: [birth year], 65 y.o.   MRN: [id number]  HPI 65 yo smoker (40 pk-yrs), hx HTN, GERD, COPD, renal cysts and hematuria. Was discovered to have a 2-93mm LLL nodule, probably calcified.   He has cough, happens daily. He has GERD that is only moderately controlled Able to work and exert. He does not use symbicort regularly.   ROV 12/28/15 -- the patient has a history of tobacco use and COPD with associated hypoxemia. He has been seen in our office by T Parrett most recently in February 2017. Review of the notes shows that he was admitted in February 2016 and February 2017 with suspected pneumonia. She did with antibiotics and steroids. In 2016 he had a CT scan of his chest that showed numerable inflammatory nodules with associated mediastinal lymphadenopathy. This was never followed up. He is using symbicort reliably, occasionally uses more than that. He has been having some increased chest tightness, some bronchitic sx, yellow mucous. [facility]. He continues to smoke around 1 pk a day.    Review of Systems  Constitutional: Negative for fever and unexpected weight change.  HENT: Positive for sore throat. Negative for congestion, dental problem, ear pain, nosebleeds, postnasal drip, rhinorrhea, sinus pressure, sneezing and trouble swallowing.   Eyes: Negative for redness and itching.  Respiratory: Positive for cough. Negative for chest tightness, shortness of breath and wheezing.   Cardiovascular: Positive for chest pain. Negative for palpitations and leg swelling.  Gastrointestinal: Negative for nausea and vomiting.  Genitourinary: Negative for dysuria.  Musculoskeletal: Negative for joint swelling.  Skin: Negative for rash.  Neurological: Negative for headaches.  Hematological: Does not bruise/bleed easily.  Psychiatric/Behavioral: Negative for dysphoric mood. The patient is not nervous/anxious.     Past Medical History  Diagnosis Date  . Emphysema/COPD ([place])   . Hypertension   . Adenomatous polyps   . COPD (chronic obstructive pulmonary disease) ([place])   . Tobacco abuse   . GERD (gastroesophageal reflux disease)      Family History  Problem Relation Age of Onset  . Lung cancer Brother   . Stomach cancer Brother   . Cancer Father      Social History   Social History  . Marital Status: Married    Spouse Name: N/A  . Number of Children: N/A  . Years of Education: N/A   Occupational History  . Restaurant owner    Social History Main Topics  . Smoking status: Current Every Day Smoker -- 1.00 packs/day for 42 years    Types: Cigarettes  . Smokeless tobacco: Never Used  . Alcohol Use: No  . Drug Use: No  . Sexual Activity: Not on file   Other Topics Concern  . Not on file   Social History Narrative  Never exposed to TB to his knowledge Originally from [place], has lived here for 29 yrs.  He works in a [employer]   No Known Allergies   Outpatient Prescriptions Prior to Visit  Medication Sig Dispense Refill  . albuterol (PROVENTIL) (2.5 MG/3ML) 0.083% nebulizer solution Take 3 mLs (2.5 mg total) by nebulization every 4 (four) hours as needed for shortness of breath. 75 mL 12  . aspirin EC 81 MG tablet Take 81 mg by mouth daily. Reported on 10/08/2015    . budesonide-formoterol (SYMBICORT) 160-4.5 MCG/ACT inhaler Inhale 2 puffs into the lungs 2 (two) times daily. 1 Inhaler 3  .  loratadine (CLARITIN) 10 MG tablet Take 1 tablet (10 mg total) by mouth daily. 30 tablet 1  . pantoprazole (PROTONIX) 40 MG tablet Take 1 tablet (40 mg total) by mouth 2 (two) times daily before a meal. 60 tablet 0  . amoxicillin-clavulanate (AUGMENTIN) 875-125 MG per tablet Take 1 tablet by mouth 2 (two) times daily. 14 tablet 0  . guaiFENesin-dextromethorphan (ROBITUSSIN DM) 100-10 MG/5ML syrup Take 5 mLs by mouth every 4 (four) hours as needed for cough. 118 mL 0  . predniSONE  (DELTASONE) 10 MG tablet 4 tabs for 2 days, then 3 tabs for 2 days, 2 tabs for 2 days, then 1 tab for 2 days, then stop 20 tablet 0   No facility-administered medications prior to visit.         Objective:   Physical Exam Filed Vitals:   12/28/15 1447 12/28/15 1448  BP:  112/80  Pulse:  107  Weight: 316 lb (143.337 kg)   SpO2:  95%   Gen: Pleasant, obese, in no distress,  normal affect  ENT: No lesions,  mouth clear,  oropharynx clear, no postnasal drip  Neck: No JVD, no TMG, no carotid bruits  Lungs: No use of accessory muscles, clear without rales or rhonchi  Cardiovascular: RRR, heart sounds normal, no murmur or gallops, no peripheral edema  Musculoskeletal: No deformities, no cyanosis or clubbing  Neuro: alert, non focal  Skin: Warm, no lesions or rashes       Assessment & Plan:  Multiple pulmonary nodules Repeat CT scan of the chest now. Based on the appearance we will determine what had a workup needs to proceed.  COPD (chronic obstructive pulmonary disease) He continues to have everyday symptoms. I'll like to add Spiriva to his existing Symbicort. Complains of chest tightness and we will treat with a prednisone taper. Continue albuterol when necessary. Smoking cessation discussed with him in detail  Tobacco abuse Discussed cessation with him today. We will set a goal of cutting and 10 cigarettes daily by our next visit.

## 2016-01-01 ENCOUNTER — Ambulatory Visit (INDEPENDENT_AMBULATORY_CARE_PROVIDER_SITE_OTHER)
Admission: RE | Admit: 2016-01-01 | Discharge: 2016-01-01 | Disposition: A | Discharge status: Home / Self Care | Payer: BLUE CROSS/BLUE SHIELD | Source: Ambulatory Visit | Attending: Emergency Medicine | Admitting: Emergency Medicine

## 2016-01-01 DIAGNOSIS — R911 Solitary pulmonary nodule: Secondary | ICD-10-CM

## 2016-01-04 ENCOUNTER — Telehealth: Payer: Self-pay | Admitting: Emergency Medicine

## 2016-01-04 NOTE — Telephone Encounter (Signed)
Pt is requesting results from his CT on 01/01/16.  RB - please advise. Thanks.

## 2016-01-07 NOTE — Telephone Encounter (Signed)
Spoke with pt and gave results and recommendations. Pt voiced understanding. Nothing further needed.  

## 2016-01-07 NOTE — Telephone Encounter (Signed)
Patient called upset about not getting a call back about CT results- advised that we are waiting for dr review, pt hung up.-prm

## 2016-01-07 NOTE — Telephone Encounter (Signed)
Called and spoke with pt. Made him aware that I will make sure that RB addresses this tomorrow while he is here.  RB - please advise on results.

## 2016-01-07 NOTE — Telephone Encounter (Signed)
Pt is very upset that he has not gotten results from CT done on 01/01/16.  RB please result ASAP so that we may contact pt with results/recommendations. Thank you!  Paged and routed to [place].

## 2016-01-07 NOTE — Telephone Encounter (Signed)
Please let him know that the nodules that we saw in 2015 have all resolved, consistent with an inflammatory process. Only one small nodule remains and it is stable in size, does not need any further follow up. This is all good news.

## 2016-02-21 ENCOUNTER — Ambulatory Visit: Payer: BLUE CROSS/BLUE SHIELD | Admitting: Emergency Medicine

## 2016-05-22 ENCOUNTER — Ambulatory Visit: Payer: BLUE CROSS/BLUE SHIELD | Admitting: Emergency Medicine

## 2016-06-12 ENCOUNTER — Ambulatory Visit: Payer: BLUE CROSS/BLUE SHIELD | Admitting: Emergency Medicine

## 2016-07-23 ENCOUNTER — Ambulatory Visit: Payer: BLUE CROSS/BLUE SHIELD | Admitting: Emergency Medicine

## 2016-08-29 ENCOUNTER — Ambulatory Visit (INDEPENDENT_AMBULATORY_CARE_PROVIDER_SITE_OTHER): Payer: Medicare Other | Admitting: Emergency Medicine

## 2016-08-29 ENCOUNTER — Encounter: Payer: Self-pay | Admitting: Emergency Medicine

## 2016-08-29 ENCOUNTER — Telehealth: Payer: Self-pay | Admitting: Emergency Medicine

## 2016-08-29 DIAGNOSIS — Z720 Tobacco use: Secondary | ICD-10-CM

## 2016-08-29 DIAGNOSIS — R918 Other nonspecific abnormal finding of lung field: Secondary | ICD-10-CM | POA: Diagnosis not present

## 2016-08-29 DIAGNOSIS — F17210 Nicotine dependence, cigarettes, uncomplicated: Secondary | ICD-10-CM

## 2016-08-29 DIAGNOSIS — Z23 Encounter for immunization: Secondary | ICD-10-CM | POA: Diagnosis not present

## 2016-08-29 DIAGNOSIS — J449 Chronic obstructive pulmonary disease, unspecified: Secondary | ICD-10-CM | POA: Diagnosis not present

## 2016-08-29 MED ORDER — ALBUTEROL SULFATE HFA 108 (90 BASE) MCG/ACT IN AERS: 2.0000 | INHALATION_SPRAY | Freq: Four times a day (QID) | RESPIRATORY_TRACT | 5 refills | Status: DC | PRN

## 2016-08-29 NOTE — Assessment & Plan Note (Signed)
Resolved on CT scan from May. He should not need any further follow-up scans.

## 2016-08-29 NOTE — Progress Notes (Signed)
Subjective:    Patient ID: Manuel Leonard, male    DOB: Dec 30, 1950, 66 y.o.   MRN: [id number]  HPI 66 yo smoker (40 pk-yrs), hx HTN, GERD, COPD, renal cysts and hematuria. Was discovered to have a 2-11mm LLL nodule, probably calcified.   He has cough, happens daily. He has GERD that is only moderately controlled Able to work and exert. He does not use symbicort regularly.   ROV 12/28/15 -- the patient has a history of tobacco use and COPD with associated hypoxemia. He has been seen in our office by T Parrett most recently in February 2017. Review of the notes shows that he was admitted in February 2016 and February 2017 with suspected pneumonia. She did with antibiotics and steroids. In 2016 he had a CT scan of his chest that showed numerable inflammatory nodules with associated mediastinal lymphadenopathy. This was never followed up. He is using symbicort reliably, occasionally uses more than that. He has been having some increased chest tightness, some bronchitic sx, yellow mucous. He [facility]. He continues to smoke around 1 pk a day.   ROV 08/29/16 -- This is a follow-up visit for active tobacco use, COPD,  pulmonary nodules that we have followed by CT scan of the chest. His most recent CT scan of the chest was done on 01/01/16 and showed full resolution of his nodular disease, with the exception of a 2 mm left nodule. No follow-ups are planned. Treated for an acute exacerbation of his COPD in October by his primary care physician. He is currently using Symbicort. Tells me that he sometimes takes it more than twice a day. We had planned to add Spiriva to this. He did not benefit so he stopped it.  He is using albuterol nebs as needed, a few times a week. He is still smoking a pack a day. He is having a lot of productive cough. He is also having some upper abd / lower chest pain.    Review of Systems  Constitutional: Negative for fever and unexpected weight  change.  HENT: Positive for sore throat. Negative for congestion, dental problem, ear pain, nosebleeds, postnasal drip, rhinorrhea, sinus pressure, sneezing and trouble swallowing.   Eyes: Negative for redness and itching.  Respiratory: Positive for cough. Negative for chest tightness, shortness of breath and wheezing.   Cardiovascular: Positive for chest pain. Negative for palpitations and leg swelling.  Gastrointestinal: Negative for nausea and vomiting.  Genitourinary: Negative for dysuria.  Musculoskeletal: Negative for joint swelling.  Skin: Negative for rash.  Neurological: Negative for headaches.  Hematological: Does not bruise/bleed easily.  Psychiatric/Behavioral: Negative for dysphoric mood. The patient is not nervous/anxious.    Past Medical History:  Diagnosis Date  . Adenomatous polyps   . COPD (chronic obstructive pulmonary disease) ([place])   . Emphysema/COPD ([place])   . GERD (gastroesophageal reflux disease)   . Hypertension   . Tobacco abuse      Family History  Problem Relation Age of Onset  . Lung cancer Brother   . Stomach cancer Brother   . Cancer Father      Social History   Social History  . Marital status: Married    Spouse name: N/A  . Number of children: N/A  . Years of education: N/A   Occupational History  . Restaurant owner    Social History Main Topics  . Smoking status: Current Every Day Smoker    Packs/day: 1.00    Years:  42.00    Types: Cigarettes  . Smokeless tobacco: Never Used  . Alcohol use No  . Drug use: No  . Sexual activity: Not on file   Other Topics Concern  . Not on file   Social History Narrative  . No narrative on file  Never exposed to TB to his knowledge Originally from [place], has lived here for 49 yrs.  He works in a [employer]   No Known Allergies   Outpatient Medications Prior to Visit  Medication Sig Dispense Refill  . albuterol (PROVENTIL) (2.5 MG/3ML) 0.083% nebulizer solution Take 3 mLs (2.5 mg  total) by nebulization every 4 (four) hours as needed for shortness of breath. 75 mL 12  . aspirin EC 81 MG tablet Take 81 mg by mouth daily. Reported on 10/08/2015    . budesonide-formoterol (SYMBICORT) 160-4.5 MCG/ACT inhaler Inhale 2 puffs into the lungs 2 (two) times daily. 1 Inhaler 3  . albuterol (PROVENTIL HFA;VENTOLIN HFA) 108 (90 Base) MCG/ACT inhaler Inhale 2 puffs into the lungs every 6 (six) hours as needed for wheezing or shortness of breath. (Patient not taking: Reported on 08/29/2016) 1 Inhaler 5  . predniSONE (DELTASONE) 10 MG tablet Take 4 tablets for 3 days, 3 tablets for 3 days, 2 tablets for 3 days, 1 tablet for 3 days (Patient not taking: Reported on 08/29/2016) 30 tablet 0  . Tiotropium Bromide Monohydrate (SPIRIVA RESPIMAT) 2.5 MCG/ACT AERS Inhale 2 puffs into the lungs daily. (Patient not taking: Reported on 08/29/2016) 1 Inhaler 5   No facility-administered medications prior to visit.          Objective:   Physical Exam Vitals:   08/29/16 1047  BP: 130/82  BP Location: Right Arm  Patient Position: Sitting  Cuff Size: Normal  Pulse: 97  SpO2: 97%  Weight: (!) 316 lb 3.2 oz (143.4 kg)  Height: 5' 11.22" (1.809 m)   Gen: Pleasant, obese, in no distress,  normal affect  ENT: No lesions,  mouth clear,  oropharynx clear, no postnasal drip  Neck: No JVD, no TMG, no carotid bruits  Lungs: No use of accessory muscles, clear without rales or rhonchi  Cardiovascular: RRR, heart sounds normal, no murmur or gallops, no peripheral edema  Musculoskeletal: No deformities, no cyanosis or clubbing  Neuro: alert, non focal  Skin: Warm, no lesions or rashes       Assessment & Plan:  Tobacco abuse Discussed cessation with him today. He is working on cutting down by using vapor. Discussed strategies for him to try to cut down further today.  COPD (chronic obstructive pulmonary disease) He had been using his Symbicort inconsistently, sometimes as a rescue medicine. I  asked him to start taking it twice a day on a regular schedule. We will give him a repeat prescription for albuterol to use when necessary. And the possibility of changing to LABA / LAMA. [place] so he will check on the cost of Anoro and Stiolto.  Multiple pulmonary nodules Resolved on CT scan from May. He should not need any further follow-up scans.  Baltazar Apo, MD, PhD 08/29/2016, 11:18 AM [facility] [phone] or if no answer [phone]

## 2016-08-29 NOTE — Assessment & Plan Note (Signed)
Discussed cessation with him today. He is working on cutting down by using vapor. Discussed strategies for him to try to cut down further today.

## 2016-08-29 NOTE — Telephone Encounter (Signed)
Called pharmacy and spoke to [place]. Pt is needing the albuterol hfa sent in. Rx sent to  [facility] in [facility]. Nothing further needed at this time.

## 2016-08-29 NOTE — Assessment & Plan Note (Signed)
He had been using his Symbicort inconsistently, sometimes as a rescue medicine. I asked him to start taking it twice a day on a regular schedule. We will give him a repeat prescription for albuterol to use when necessary. And the possibility of changing to LABA / LAMA. [place] and so he will check on the cost of Anoro and Stiolto.

## 2016-08-29 NOTE — Patient Instructions (Signed)
Investigate the [place] cost of either Anoro or [employer]. We may consider starting one of these medications depending on the cost.  Please continue your Symbicort twice a day.  We will restart albuterol 2 puffs as needed for shortness of breath Continue to work on weight loss Continue to work on stopping your cigarettes.  Follow with Dr Lamonte Sakai in 4 months or sooner if you have any problems.

## 2016-08-30 ENCOUNTER — Other Ambulatory Visit: Payer: Self-pay | Admitting: Emergency Medicine

## 2016-10-07 ENCOUNTER — Ambulatory Visit: Payer: Self-pay | Admitting: Surgery

## 2016-10-07 DIAGNOSIS — K429 Umbilical hernia without obstruction or gangrene: Secondary | ICD-10-CM | POA: Diagnosis not present

## 2016-10-16 ENCOUNTER — Encounter (HOSPITAL_COMMUNITY): Payer: Self-pay

## 2016-10-16 NOTE — Patient Instructions (Signed)
Shanard Dutta  10/16/2016   Your procedure is scheduled on: 10/21/2016    Report to [facility] Main  Entrance take [place]  elevators to 3rd floor to  [place] at    0930 AM.  Call this number if you have problems the morning of surgery [phone]   Remember: ONLY 1 PERSON MAY GO WITH YOU TO SHORT STAY TO GET  READY MORNING OF [place].  Do not eat food or drink liquids :After Midnight.     Take these medicines the morning of surgery with A SIP OF WATER: Albuterol Inhaler if needed and bring, Nebulizer if needed, Symbicort and bring, Eye drops as usual                                 You may not have any metal on your body including hair pins and              piercings  Do not wear jewelry,  lotions, powders or perfumes, deodorant                          Men may shave face and neck.   Do not bring valuables to the hospital. [place].  Contacts, dentures or bridgework may not be worn into surgery. .     Patients discharged the day of surgery will not be allowed to drive home.  Name and phone number of your driver:                Please read over the following fact sheets you were given: _____________________________________________________________________             [facility] - Preparing for Surgery Before surgery, you can play an important role.  Because skin is not sterile, your skin needs to be as free of germs as possible.  You can reduce the number of germs on your skin by washing with CHG (chlorahexidine gluconate) soap before surgery.  CHG is an antiseptic cleaner which kills germs and bonds with the skin to continue killing germs even after washing. Please DO NOT use if you have an allergy to CHG or antibacterial soaps.  If your skin becomes reddened/irritated stop using the CHG and inform your nurse when you arrive at Short Stay. Do not shave (including legs and underarms)  for at least 48 hours prior to the first CHG shower.  You may shave your face/neck. Please follow these instructions carefully:  1.  Shower with CHG Soap the night before surgery and the  morning of Surgery.  2.  If you choose to wash your hair, wash your hair first as usual with your  normal  shampoo.  3.  After you shampoo, rinse your hair and body thoroughly to remove the  shampoo.                           4.  Use CHG as you would any other liquid soap.  You can apply chg directly  to the skin and wash  Gently with a scrungie or clean washcloth.  5.  Apply the CHG Soap to your body ONLY FROM THE NECK DOWN.   Do not use on face/ open                           Wound or open sores. Avoid contact with eyes, ears mouth and genitals (private parts).                       Wash face,  Genitals (private parts) with your normal soap.             6.  Wash thoroughly, paying special attention to the area where your surgery  will be performed.  7.  Thoroughly rinse your body with warm water from the neck down.  8.  DO NOT shower/wash with your normal soap after using and rinsing off  the CHG Soap.                9.  Pat yourself dry with a clean towel.            10.  Wear clean pajamas.            11.  Place clean sheets on your bed the night of your first shower and do not  sleep with pets. Day of Surgery : Do not apply any lotions/deodorants the morning of surgery.  Please wear clean clothes to the hospital/surgery center.  FAILURE TO FOLLOW THESE INSTRUCTIONS MAY RESULT IN THE CANCELLATION OF YOUR SURGERY PATIENT SIGNATURE_________________________________  NURSE SIGNATURE__________________________________  ________________________________________________________________________

## 2016-10-20 ENCOUNTER — Encounter (HOSPITAL_COMMUNITY): Payer: Self-pay | Admitting: Surgery

## 2016-10-20 ENCOUNTER — Encounter (HOSPITAL_COMMUNITY)
Admission: RE | Admit: 2016-10-20 | Discharge: 2016-10-20 | Disposition: A | Discharge status: Home / Self Care | Payer: Medicare Other | Source: Ambulatory Visit | Attending: Surgery | Admitting: Surgery

## 2016-10-20 ENCOUNTER — Encounter (HOSPITAL_COMMUNITY): Payer: Self-pay

## 2016-10-20 DIAGNOSIS — Z6841 Body Mass Index (BMI) 40.0 and over, adult: Secondary | ICD-10-CM | POA: Diagnosis not present

## 2016-10-20 DIAGNOSIS — K429 Umbilical hernia without obstruction or gangrene: Secondary | ICD-10-CM | POA: Diagnosis present

## 2016-10-20 DIAGNOSIS — I10 Essential (primary) hypertension: Secondary | ICD-10-CM | POA: Diagnosis not present

## 2016-10-20 DIAGNOSIS — Z7982 Long term (current) use of aspirin: Secondary | ICD-10-CM | POA: Diagnosis not present

## 2016-10-20 DIAGNOSIS — J449 Chronic obstructive pulmonary disease, unspecified: Secondary | ICD-10-CM | POA: Diagnosis not present

## 2016-10-20 DIAGNOSIS — F17200 Nicotine dependence, unspecified, uncomplicated: Secondary | ICD-10-CM | POA: Diagnosis not present

## 2016-10-20 DIAGNOSIS — Z809 Family history of malignant neoplasm, unspecified: Secondary | ICD-10-CM | POA: Diagnosis not present

## 2016-10-20 DIAGNOSIS — Z86010 Personal history of colonic polyps: Secondary | ICD-10-CM | POA: Diagnosis not present

## 2016-10-20 DIAGNOSIS — Z79899 Other long term (current) drug therapy: Secondary | ICD-10-CM | POA: Diagnosis not present

## 2016-10-20 HISTORY — DX: Umbilical hernia without obstruction or gangrene: K42.9

## 2016-10-20 HISTORY — DX: Pneumonia, unspecified organism: J18.9

## 2016-10-20 LAB — CBC
HCT: 51.5 % (ref 39.0–52.0)
HEMOGLOBIN: 17.4 g/dL — AB (ref 13.0–17.0)
MCH: 30.3 pg (ref 26.0–34.0)
MCHC: 33.8 g/dL (ref 30.0–36.0)
MCV: 89.6 fL (ref 78.0–100.0)
Platelets: 142 10*3/uL — ABNORMAL LOW (ref 150–400)
RBC: 5.75 MIL/uL (ref 4.22–5.81)
RDW: 14 % (ref 11.5–15.5)
WBC: 10.5 10*3/uL (ref 4.0–10.5)

## 2016-10-20 LAB — BASIC METABOLIC PANEL
ANION GAP: 6 (ref 5–15)
BUN: 22 mg/dL — AB (ref 6–20)
CHLORIDE: 103 mmol/L (ref 101–111)
CO2: 30 mmol/L (ref 22–32)
Calcium: 9.7 mg/dL (ref 8.9–10.3)
Creatinine, Ser: 1.14 mg/dL (ref 0.61–1.24)
GFR calc Af Amer: >60 mL/min (ref >60)
GLUCOSE: 142 mg/dL — AB (ref 65–99)
Potassium: 4.9 mmol/L (ref 3.5–5.1)
Sodium: 139 mmol/L (ref 135–145)

## 2016-10-20 MED ORDER — DEXTROSE 5 % IV SOLN
3.0000 g | INTRAVENOUS | Status: AC
  Administered 2016-10-21: 3 g via INTRAVENOUS
  Filled 2016-10-20: qty 3

## 2016-10-20 NOTE — H&P (Signed)
General Surgery [facility]  Manuel Leonard 10/07/2016 1:37 PM Location: [facility] Patient #: [id number] DOB: November 29, 1950 Married / Language: English / Race: Refused to Report/Unreported Male   History of Present Illness Manuel Regal MD; 10/07/2016 2:01 PM) The patient is a 66 year old male who presents with an umbilical hernia.  Patient presents with umbilical hernia. Patient states that her knee has been present for approximately one year and gradually becoming larger. It causes intermittent mild discomfort. He has had no signs or symptoms of intestinal obstruction. Patient has undergone previous bilateral inguinal hernia repairs as well as an appendectomy. Patient presents today to discuss umbilical hernia repair. Hernia has always been reducible.   Past Surgical History Mammie Lorenzo, LPN; 624THL QA348G PM) Colon Polyp Removal - Colonoscopy  Colon Polyp Removal - Open  Laparoscopic Inguinal Hernia Surgery  Bilateral. Tonsillectomy   Allergies Mammie Lorenzo, LPN; 624THL [id number] PM) No Known Drug Allergies 10/07/2016  Medication History Mammie Lorenzo, LPN; 624THL QA348G PM) Albuterol Sulfate (108 (90 Base)MCG/ACT Aero Pow Br Act, Inhalation) Active. Symbicort (160-4.5MCG/ACT Aerosol, Inhalation) Active. Spiriva Respimat (2.5MCG/ACT Aerosol Soln, Inhalation) Active. Medications Reconciled Aspirin (81MG  Tablet DR, Oral) Active.  Social History Mammie Lorenzo, LPN; 624THL QA348G PM) Alcohol use  Moderate alcohol use. Caffeine use  Coffee, Tea. No drug use  Tobacco use  Current every day smoker.  Family History Mammie Lorenzo, LPN; 624THL QA348G PM) Cancer  Brother, Father, Mother.  Other Problems Mammie Lorenzo, LPN; 624THL QA348G PM) Chronic Obstructive Lung Disease  Umbilical Hernia Repair     Review of Systems [name]; 624THL QA348G PM) General Not Present- Appetite Loss, Chills, Fatigue,  Fever, Night Sweats, Weight Gain and Weight Loss. Skin Present- Dryness. Not Present- Change in Wart/Mole, Hives, Jaundice, New Lesions, Non-Healing Wounds, Rash and Ulcer. HEENT Not Present- Earache, Hearing Loss, Hoarseness, Nose Bleed, Oral Ulcers, Ringing in the Ears, Seasonal Allergies, Sinus Pain, Sore Throat, Visual Disturbances, Wears glasses/contact lenses and Yellow Eyes. Cardiovascular Present- Chest Pain, Difficulty Breathing Lying Down and Shortness of Breath. Not Present- Leg Cramps, Palpitations, Rapid Heart Rate and Swelling of Extremities. Male Genitourinary Present- Frequency. Not Present- Blood in Urine, Change in Urinary Stream, Impotence, Nocturia, Painful Urination, Urgency and Urine Leakage.  Vitals [name]; 624THL [id number] PM) 10/07/2016 1:38 PM Weight: 317.8 lb Height: 71in Body Surface Area: 2.57 m Body Mass Index: 44.32 kg/m  Temp.: 97.25F(Oral)  Pulse: 86 (Regular)  BP: 132/80 (Sitting, Left Arm, Standard)       Physical Exam Manuel Regal MD; 10/07/2016 2:03 PM) The physical exam findings are as follows: Note:General - appears comfortable, no distress; not diaphorectic  HEENT - normocephalic; sclerae clear, gaze conjugate; mucous membranes moist, dentition good; voice normal  Neck - symmetric on extension; no palpable anterior or posterior cervical adenopathy; no palpable masses in the thyroid bed  Chest - clear bilaterally without rhonchi, rales, or wheeze  Cor - regular rhythm with normal rate; no significant murmur  Abd - soft without distension; well-healed appendectomy incision; obvious umbilical hernia, soft, reducible, with a fascial defect measuring approximately 1-1.5 cm in diameter; hernia prolapses with Valsalva  Ext - non-tender without significant edema or lymphedema  Neuro - grossly intact; no tremor    Assessment & Plan Manuel Regal MD; [id number] PM)  UMBILICAL HERNIA WITHOUT OBSTRUCTION OR GANGRENE  (K42.9)   Pt Education - Pamphlet Given - Hernia Surgery: discussed with patient and provided information.  Patient presents with  enlarging umbilical hernia. He is provided with written literature on hernia surgery to review at home.  Patient has a reducible umbilical hernia. I think he would do well with an open repair using a mesh patch. We discussed the procedure. We discussed general anesthesia. We discussed the use of prosthetic mesh. We discussed the small risk of recurrence. We discussed restrictions on his activities after the surgery. We will make arrangements for outpatient surgery at [facility] at a time convenient for the patient in the near future.  The risks and benefits of the procedure have been discussed at length with the patient. The patient understands the proposed procedure, potential alternative treatments, and the course of recovery to be expected. All of the patient's questions have been answered at this time. The patient wishes to proceed with surgery.  Manuel Regal, MD, [facility] Office: [phone]

## 2016-10-20 NOTE — Progress Notes (Signed)
09/29/14-echo-epic  Ct chest-01/01/16-epic  08/29/16-LOV-pulm-epic

## 2016-10-21 ENCOUNTER — Ambulatory Visit (HOSPITAL_COMMUNITY)
Admission: RE | Admit: 2016-10-21 | Discharge: 2016-10-21 | Disposition: A | Discharge status: Home / Self Care | Payer: Medicare Other | Source: Ambulatory Visit | Attending: Surgery | Admitting: Surgery

## 2016-10-21 ENCOUNTER — Ambulatory Visit (HOSPITAL_COMMUNITY): Payer: Medicare Other | Admitting: Certified Registered Nurse Anesthetist

## 2016-10-21 ENCOUNTER — Encounter (HOSPITAL_COMMUNITY)
Admission: RE | Disposition: A | Discharge status: Home / Self Care | Payer: Self-pay | Source: Ambulatory Visit | Attending: Surgery

## 2016-10-21 ENCOUNTER — Encounter (HOSPITAL_COMMUNITY): Payer: Self-pay | Admitting: *Deleted

## 2016-10-21 DIAGNOSIS — I10 Essential (primary) hypertension: Secondary | ICD-10-CM | POA: Insufficient documentation

## 2016-10-21 DIAGNOSIS — Z809 Family history of malignant neoplasm, unspecified: Secondary | ICD-10-CM | POA: Insufficient documentation

## 2016-10-21 DIAGNOSIS — Z7982 Long term (current) use of aspirin: Secondary | ICD-10-CM | POA: Diagnosis not present

## 2016-10-21 DIAGNOSIS — Z86010 Personal history of colonic polyps: Secondary | ICD-10-CM | POA: Insufficient documentation

## 2016-10-21 DIAGNOSIS — Z79899 Other long term (current) drug therapy: Secondary | ICD-10-CM | POA: Insufficient documentation

## 2016-10-21 DIAGNOSIS — J449 Chronic obstructive pulmonary disease, unspecified: Secondary | ICD-10-CM | POA: Insufficient documentation

## 2016-10-21 DIAGNOSIS — F17200 Nicotine dependence, unspecified, uncomplicated: Secondary | ICD-10-CM | POA: Diagnosis not present

## 2016-10-21 DIAGNOSIS — Z6841 Body Mass Index (BMI) 40.0 and over, adult: Secondary | ICD-10-CM | POA: Insufficient documentation

## 2016-10-21 DIAGNOSIS — K429 Umbilical hernia without obstruction or gangrene: Secondary | ICD-10-CM | POA: Diagnosis present

## 2016-10-21 DIAGNOSIS — J9601 Acute respiratory failure with hypoxia: Secondary | ICD-10-CM | POA: Diagnosis not present

## 2016-10-21 HISTORY — PX: INSERTION OF MESH: SHX5868

## 2016-10-21 HISTORY — PX: UMBILICAL HERNIA REPAIR: SHX196

## 2016-10-21 SURGERY — REPAIR, HERNIA, UMBILICAL, ADULT
Anesthesia: General | Site: Abdomen

## 2016-10-21 MED ORDER — LIDOCAINE HCL (CARDIAC) 20 MG/ML IV SOLN: INTRAVENOUS | Status: DC | PRN

## 2016-10-21 MED ORDER — SUCCINYLCHOLINE CHLORIDE 20 MG/ML IJ SOLN
INTRAMUSCULAR | Status: DC | PRN
  Administered 2016-10-21: 140 mg via INTRAVENOUS

## 2016-10-21 MED ORDER — PHENYLEPHRINE HCL 10 MG/ML IJ SOLN
INTRAMUSCULAR | Status: DC | PRN
  Administered 2016-10-21 (×2): 40 ug via INTRAVENOUS
  Administered 2016-10-21: 80 ug via INTRAVENOUS
  Administered 2016-10-21 (×2): 40 ug via INTRAVENOUS

## 2016-10-21 MED ORDER — HYDROCODONE-ACETAMINOPHEN 5-325 MG PO TABS: 1.0000 | ORAL_TABLET | ORAL | 0 refills | Status: DC | PRN

## 2016-10-21 MED ORDER — PROPOFOL 10 MG/ML IV BOLUS
INTRAVENOUS | Status: DC | PRN
  Administered 2016-10-21: 200 mg via INTRAVENOUS
  Administered 2016-10-21: 50 mg via INTRAVENOUS

## 2016-10-21 MED ORDER — MIDAZOLAM HCL 5 MG/5ML IJ SOLN
INTRAMUSCULAR | Status: DC | PRN
  Administered 2016-10-21 (×2): 1 mg via INTRAVENOUS

## 2016-10-21 MED ORDER — DEXAMETHASONE SODIUM PHOSPHATE 10 MG/ML IJ SOLN
INTRAMUSCULAR | Status: DC | PRN
  Administered 2016-10-21: 10 mg via INTRAVENOUS

## 2016-10-21 MED ORDER — PROPOFOL 10 MG/ML IV BOLUS
INTRAVENOUS | Status: AC
  Filled 2016-10-21: qty 20

## 2016-10-21 MED ORDER — BUPIVACAINE HCL (PF) 0.25 % IJ SOLN
INTRAMUSCULAR | Status: AC
  Filled 2016-10-21: qty 30

## 2016-10-21 MED ORDER — BUPIVACAINE HCL 0.25 % IJ SOLN
INTRAMUSCULAR | Status: DC | PRN
  Administered 2016-10-21: 10 mL

## 2016-10-21 MED ORDER — LACTATED RINGERS IV SOLN
INTRAVENOUS | Status: DC
  Administered 2016-10-21 (×2): via INTRAVENOUS

## 2016-10-21 MED ORDER — MIDAZOLAM HCL 2 MG/2ML IJ SOLN
INTRAMUSCULAR | Status: AC
  Filled 2016-10-21: qty 2

## 2016-10-21 MED ORDER — FENTANYL CITRATE (PF) 100 MCG/2ML IJ SOLN
INTRAMUSCULAR | Status: DC | PRN
  Administered 2016-10-21 (×2): 50 ug via INTRAVENOUS

## 2016-10-21 MED ORDER — FENTANYL CITRATE (PF) 100 MCG/2ML IJ SOLN: 25.0000 ug | INTRAMUSCULAR | Status: DC | PRN

## 2016-10-21 MED ORDER — HYDROCODONE-ACETAMINOPHEN 5-325 MG PO TABS: 1.0000 | ORAL_TABLET | ORAL | Status: DC | PRN

## 2016-10-21 MED ORDER — LIDOCAINE HCL (CARDIAC) 20 MG/ML IV SOLN
INTRAVENOUS | Status: DC | PRN
  Administered 2016-10-21: 100 mg via INTRAVENOUS

## 2016-10-21 MED ORDER — METOCLOPRAMIDE HCL 5 MG/ML IJ SOLN: 10.0000 mg | Freq: Once | INTRAMUSCULAR | Status: DC | PRN

## 2016-10-21 MED ORDER — FENTANYL CITRATE (PF) 100 MCG/2ML IJ SOLN
INTRAMUSCULAR | Status: AC
  Filled 2016-10-21: qty 2

## 2016-10-21 MED ORDER — MEPERIDINE HCL 50 MG/ML IJ SOLN: 6.2500 mg | INTRAMUSCULAR | Status: DC | PRN

## 2016-10-21 MED ORDER — ALBUTEROL SULFATE HFA 108 (90 BASE) MCG/ACT IN AERS
INHALATION_SPRAY | RESPIRATORY_TRACT | Status: DC | PRN
  Administered 2016-10-21: 5 via RESPIRATORY_TRACT

## 2016-10-21 MED ORDER — 0.9 % SODIUM CHLORIDE (POUR BTL) OPTIME
TOPICAL | Status: DC | PRN
  Administered 2016-10-21: 1000 mL

## 2016-10-21 MED ORDER — PROPOFOL 10 MG/ML IV BOLUS: INTRAVENOUS | Status: DC | PRN

## 2016-10-21 SURGICAL SUPPLY — 24 items
BLADE HEX COATED 2.75 (ELECTRODE) IMPLANT
CHLORAPREP W/TINT 26ML (MISCELLANEOUS) ×6 IMPLANT
CLOSURE WOUND 1/2 X4 (GAUZE/BANDAGES/DRESSINGS)
COVER SURGICAL LIGHT HANDLE (MISCELLANEOUS) ×3 IMPLANT
DECANTER SPIKE VIAL GLASS SM (MISCELLANEOUS) ×3 IMPLANT
DRAPE LAPAROSCOPIC ABDOMINAL (DRAPES) ×3 IMPLANT
ELECT REM PT RETURN 9FT ADLT (ELECTROSURGICAL) ×3
ELECTRODE REM PT RTRN 9FT ADLT (ELECTROSURGICAL) ×1 IMPLANT
GAUZE SPONGE 4X4 12PLY STRL (GAUZE/BANDAGES/DRESSINGS) IMPLANT
GLOVE SURG ORTHO 8.0 STRL STRW (GLOVE) ×3 IMPLANT
GOWN STRL REUS W/TWL XL LVL3 (GOWN DISPOSABLE) ×6 IMPLANT
KIT BASIN OR (CUSTOM PROCEDURE TRAY) ×3 IMPLANT
LIQUID BAND (GAUZE/BANDAGES/DRESSINGS) ×3 IMPLANT
MARKER SKIN DUAL TIP RULER LAB (MISCELLANEOUS) ×3 IMPLANT
MESH VENTRALEX ST 1-7/10 CRC S (Mesh General) ×3 IMPLANT
NEEDLE HYPO 22GX1.5 SAFETY (NEEDLE) ×3 IMPLANT
PACK GENERAL/GYN (CUSTOM PROCEDURE TRAY) ×3 IMPLANT
STRIP CLOSURE SKIN 1/2X4 (GAUZE/BANDAGES/DRESSINGS) IMPLANT
SUT MNCRL AB 4-0 PS2 18 (SUTURE) ×3 IMPLANT
SUT NOVA NAB GS-21 0 18 T12 DT (SUTURE) IMPLANT
SUT VIC AB 3-0 SH 18 (SUTURE) ×3 IMPLANT
SYR 20CC LL (SYRINGE) ×3 IMPLANT
TOWEL OR 17X26 10 PK STRL BLUE (TOWEL DISPOSABLE) ×3 IMPLANT
TOWEL OR NON WOVEN STRL DISP B (DISPOSABLE) ×3 IMPLANT

## 2016-10-21 NOTE — Transfer of Care (Signed)
Immediate Anesthesia Transfer of Care Note  Patient: Manuel Leonard  Procedure(s) Performed: Procedure(s): UMBILICAL HERNIA REPAIR (N/A) INSERTION OF MESH PATCH (N/A)  Patient Location: PACU  Anesthesia Type:General  Level of Consciousness: awake, oriented, patient cooperative, lethargic and responds to stimulation  Airway & Oxygen Therapy: Patient Spontanous Breathing and Patient connected to face mask oxygen  Post-op Assessment: Report given to RN, Post -op Vital signs reviewed and stable and Patient moving all extremities  Post vital signs: Reviewed and stable  Last Vitals:  Vitals:   10/21/16 0930  BP: (!) 154/85  Pulse: 95  Resp: 16  Temp: 36.4 C    Last Pain:  Vitals:   10/21/16 0945  TempSrc:   PainSc: 0-No pain      Patients Stated Pain Goal: 3 ([id number])  Complications: No apparent anesthesia complications

## 2016-10-21 NOTE — Brief Op Note (Signed)
10/21/2016  12:55 PM  PATIENT:  Manuel Leonard  66 y.o. male  PRE-OPERATIVE DIAGNOSIS:  umbilical hernia  POST-OPERATIVE DIAGNOSIS:  umbilical hernia  PROCEDURE:  Procedure(s): UMBILICAL HERNIA REPAIR (N/A) INSERTION OF MESH PATCH (N/A)  SURGEON:  Surgeon(s) and Role:    * Armandina Gemma, MD - Primary  ANESTHESIA:   general  EBL:  Total I/O In: 1000 [I.V.:1000] Out: 0   BLOOD ADMINISTERED:none  DRAINS: none   LOCAL MEDICATIONS USED:  MARCAINE     SPECIMEN:  No Specimen  DISPOSITION OF SPECIMEN:  N/A  COUNTS:  YES  TOURNIQUET:  * No tourniquets in log *  DICTATION: .Other Dictation: Dictation Number [id number]  PLAN OF CARE: Discharge to home after PACU  PATIENT DISPOSITION:  PACU - hemodynamically stable.   Delay start of Pharmacological VTE agent (>24hrs) due to surgical blood loss or risk of bleeding: yes  Earnstine Regal, MD, [facility] Office: [phone]

## 2016-10-21 NOTE — Progress Notes (Signed)
Patient back to [id number] after umbilical hernia repair. Up to recliner chair to help with breathing (very large abdomen). Up to bathroom and able to void. States minimal pain.

## 2016-10-21 NOTE — Anesthesia Procedure Notes (Signed)
Procedure Name: Intubation Date/Time: 10/21/2016 12:04 PM Performed by: Montez Hageman Pre-anesthesia Checklist: Patient identified, Emergency Drugs available, Suction available, Patient being monitored and Timeout performed Patient Re-evaluated:Patient Re-evaluated prior to inductionOxygen Delivery Method: Circle system utilized Preoxygenation: Pre-oxygenation with 100% oxygen Intubation Type: IV induction and Cricoid Pressure applied Ventilation: Two handed mask ventilation required Laryngoscope Size: Glidescope and 4 Grade View: Grade III Tube type: Oral Number of attempts: 2 Airway Equipment and Method: Bougie stylet and Video-laryngoscopy Placement Confirmation: ETT inserted through vocal cords under direct vision,  positive ETCO2 and breath sounds checked- equal and bilateral Secured at: 23 cm Tube secured with: Tape Dental Injury: Teeth and Oropharynx as per pre-operative assessment  Difficulty Due To: Difficulty was anticipated and Difficult Airway- due to large tongue Future Recommendations: Recommend- induction with short-acting agent, and alternative techniques readily available Comments: LMA ok with surgeon, attempted LMA  Twice, #4, #5, poor seal with each.  To ETT.  One attempt with bougee, poor visualization, to glidescope with success

## 2016-10-21 NOTE — Discharge Instructions (Signed)
°  You have skin glue on your incision. There is no dressing changes. Use ice bag to help prevent bruising and swelling.        General Anesthesia, Adult, Care After These instructions provide you with information about caring for yourself after your procedure. Your health care provider may also give you more specific instructions. Your treatment has been planned according to current medical practices, but problems sometimes occur. Call your health care provider if you have any problems or questions after your procedure. What can I expect after the procedure? After the procedure, it is common to have:  Vomiting.  A sore throat.  Mental slowness. It is common to feel:  Nauseous.  Cold or shivery.  Sleepy.  Tired.  Sore or achy, even in parts of your body where you did not have surgery. Follow these instructions at home: For at least 24 hours after the procedure:   Do not:  Participate in activities where you could fall or become injured.  Drive.  Use heavy machinery.  Drink alcohol.  Take sleeping pills or medicines that cause drowsiness.  Make important decisions or sign legal documents.  Take care of children on your own.  Rest. Eating and drinking   If you vomit, drink water, juice, or soup when you can drink without vomiting.  Drink enough fluid to keep your urine clear or pale yellow.  Make sure you have little or no nausea before eating solid foods.  Follow the diet recommended by your health care provider. General instructions   Have a responsible adult stay with you until you are awake and alert.  Return to your normal activities as told by your health care provider. Ask your health care provider what activities are safe for you.  Take over-the-counter and prescription medicines only as told by your health care provider.  If you smoke, do not smoke without supervision.  Keep all follow-up visits as told by your health care provider. This is  important. Contact a health care provider if:  You continue to have nausea or vomiting at home, and medicines are not helpful.  You cannot drink fluids or start eating again.  You cannot urinate after 8-12 hours.  You develop a skin rash.  You have fever.  You have increasing redness at the site of your procedure. Get help right away if:  You have difficulty breathing.  You have chest pain.  You have unexpected bleeding.  You feel that you are having a life-threatening or urgent problem. This information is not intended to replace advice given to you by your health care provider. Make sure you discuss any questions you have with your health care provider. Document Released: 11/17/2000 Document Revised: 01/14/2016 Document Reviewed: 07/26/2015 Elsevier Interactive Patient Education  2017 [employer].

## 2016-10-21 NOTE — Anesthesia Postprocedure Evaluation (Signed)
Anesthesia Post Note  Patient: Academic librarian  Procedure(s) Performed: Procedure(s) (LRB): UMBILICAL HERNIA REPAIR (N/A) INSERTION OF MESH PATCH (N/A)  Patient location during evaluation: PACU Anesthesia Type: General Level of consciousness: awake and alert Pain management: pain level controlled Vital Signs Assessment: post-procedure vital signs reviewed and stable Respiratory status: spontaneous breathing, nonlabored ventilation, respiratory function stable and patient connected to nasal cannula oxygen Cardiovascular status: blood pressure returned to baseline and stable Postop Assessment: no signs of nausea or vomiting Anesthetic complications: no       Last Vitals:  Vitals:   10/21/16 1315 10/21/16 1330  BP: 114/84 134/76  Pulse: 87 90  Resp: 18 18  Temp:      Last Pain:  Vitals:   10/21/16 1330  TempSrc:   PainSc: 2                  Montez Hageman

## 2016-10-21 NOTE — Anesthesia Postprocedure Evaluation (Signed)
Anesthesia Post Note  Patient: Academic librarian  Procedure(s) Performed: Procedure(s) (LRB): UMBILICAL HERNIA REPAIR (N/A) INSERTION OF MESH PATCH (N/A)  Patient location during evaluation: PACU Anesthesia Type: General Level of consciousness: awake and alert Pain management: pain level controlled Vital Signs Assessment: post-procedure vital signs reviewed and stable Respiratory status: spontaneous breathing, nonlabored ventilation, respiratory function stable and patient connected to nasal cannula oxygen Cardiovascular status: blood pressure returned to baseline and stable Postop Assessment: no signs of nausea or vomiting Anesthetic complications: no       Last Vitals:  Vitals:   10/21/16 1303 10/21/16 1315  BP:  114/84  Pulse: 92 87  Resp:  18  Temp: 36.3 C     Last Pain:  Vitals:   10/21/16 1330  TempSrc:   PainSc: 2                  Montez Hageman

## 2016-10-21 NOTE — Interval H&P Note (Signed)
History and Physical Interval Note:  10/21/2016 11:32 AM  Manuel Leonard  has presented today for surgery, with the diagnosis of umbilical hernia.  The various methods of treatment have been discussed with the patient and family. After consideration of risks, benefits and other options for treatment, the patient has consented to    Procedure(s): UMBILICAL HERNIA REPAIR (N/A) INSERTION OF MESH PATCH (N/A) as a surgical intervention .    The patient's history has been reviewed, patient examined, no change in status, stable for surgery.  I have reviewed the patient's chart and labs.  Questions were answered to the patient's satisfaction.    Earnstine Regal, MD, [facility] Office: [place]

## 2016-10-21 NOTE — Anesthesia Preprocedure Evaluation (Signed)
Anesthesia Evaluation  Patient identified by MRN, date of birth, ID band Patient awake    Reviewed: Allergy & Precautions, NPO status , Patient's Chart, lab work & pertinent test results  Airway Mallampati: II  TM Distance: >3 FB Neck ROM: Full    Dental no notable dental hx. (+) Edentulous Upper, Edentulous Lower   Pulmonary COPD,  COPD inhaler, Current Smoker,    Pulmonary exam normal breath sounds clear to auscultation       Cardiovascular hypertension, Pt. on medications Normal cardiovascular exam Rhythm:Regular Rate:Normal     Neuro/Psych negative neurological ROS  negative psych ROS   GI/Hepatic Neg liver ROS, GERD  Controlled,  Endo/Other  Morbid obesity  Renal/GU negative Renal ROS  negative genitourinary   Musculoskeletal negative musculoskeletal ROS (+)   Abdominal   Peds negative pediatric ROS (+)  Hematology negative hematology ROS (+)   Anesthesia Other Findings   Reproductive/Obstetrics negative OB ROS                             Anesthesia Physical Anesthesia Plan  ASA: III  Anesthesia Plan: General   Post-op Pain Management:    Induction: Intravenous  Airway Management Planned: LMA  Additional Equipment:   Intra-op Plan:   Post-operative Plan:   Informed Consent: I have reviewed the patients History and Physical, chart, labs and discussed the procedure including the risks, benefits and alternatives for the proposed anesthesia with the patient or authorized representative who has indicated his/her understanding and acceptance.   Dental advisory given  Plan Discussed with: CRNA  Anesthesia Plan Comments:         Anesthesia Quick Evaluation

## 2016-10-22 NOTE — Op Note (Signed)
NAME:  Manuel Leonard, Manuel Leonard NO.:  [id number]  MEDICAL RECORD NO.:  [id number]  LOCATION:                                 FACILITY:  PHYSICIAN:  Earnstine Regal, MD      DATE OF BIRTH:  01-14-1951  DATE OF PROCEDURE:  10/21/2016                              OPERATIVE REPORT   PREOPERATIVE DIAGNOSIS:  Umbilical hernia.  POSTOPERATIVE DIAGNOSIS:  Umbilical hernia.  PROCEDURE:  Repair of umbilical hernia with Ventralex ST 4.3 cm mesh patch.  SURGEON:  Armandina Gemma, MD, FACS  ANESTHESIA:  General.  ESTIMATED BLOOD LOSS:  Minimal.  PREPARATION:  ChloraPrep.  COMPLICATIONS:  None.  INDICATIONS:  The patient is a 66 year old male who presents with an umbilical hernia.  This has been present for approximately 1 year and gradually increasing in size.  It causes intermittent discomfort.  He has had no signs or symptoms of intestinal obstruction.  He has undergone previous bilateral inguinal hernia repair.  The patient now comes to Surgery for repair of umbilical hernia with mesh patch.  BODY OF REPORT:  Procedure was done in OR #4 to [facility].  The patient was brought to the operating room and placed in supine position on the operating room table.  Following administration of general anesthesia, the patient was positioned and then prepped and draped in the usual strict aseptic fashion.  After ascertaining that an adequate level of anesthesia had been achieved, an incision was made transversely just below the umbilicus.  Dissection was carried through subcutaneous tissues to the fascia.  Fascial defect was identified. Hernia sac was dissected away from the undersurface of the umbilical skin and reduced.  The fascial defect was delineated circumferentially. A 4.3 cm Ventralex ST mesh patch was brought on the field and prepared. It was inserted into the preperitoneal space and deployed circumferentially.  It was secured to the abdominal wall  with interrupted 0 Novafil simple sutures.  Fascial defect was then closed over the mesh with interrupted 0 Novafil simple sutures.  Local field block was placed with Marcaine.  The umbilicus was reattached to the fascia with an interrupted 0 Novafil suture.  Subcutaneous tissues were closed with interrupted 3-0 Vicryl sutures.  Skin was anesthetized with local anesthetic.  Skin edges were reapproximated with a running 4-0 Monocryl subcuticular suture.  Wound was washed and dried and Dermabond was applied.  The patient was awakened from anesthesia and brought to the recovery room.  The patient tolerated the procedure well.   Earnstine Regal, MD, [facility] Office: [phone]   TMG/MEDQ  D:  10/21/2016  T:  10/21/2016  Job:  [id number]

## 2016-12-29 ENCOUNTER — Ambulatory Visit (INDEPENDENT_AMBULATORY_CARE_PROVIDER_SITE_OTHER): Payer: Medicare Other | Admitting: Emergency Medicine

## 2016-12-29 ENCOUNTER — Encounter: Payer: Self-pay | Admitting: Emergency Medicine

## 2016-12-29 DIAGNOSIS — J209 Acute bronchitis, unspecified: Secondary | ICD-10-CM

## 2016-12-29 HISTORY — DX: Acute bronchitis, unspecified: J20.9

## 2016-12-29 MED ORDER — AZITHROMYCIN 250 MG PO TABS: ORAL_TABLET | ORAL | 0 refills | Status: DC

## 2016-12-29 MED ORDER — ALBUTEROL SULFATE HFA 108 (90 BASE) MCG/ACT IN AERS: 2.0000 | INHALATION_SPRAY | Freq: Four times a day (QID) | RESPIRATORY_TRACT | 5 refills | Status: DC | PRN

## 2016-12-29 MED ORDER — TIOTROPIUM BROMIDE-OLODATEROL 2.5-2.5 MCG/ACT IN AERS: 2.0000 | INHALATION_SPRAY | Freq: Every day | RESPIRATORY_TRACT | 2 refills | Status: DC

## 2016-12-29 NOTE — Assessment & Plan Note (Signed)
Discussed cessation with him today. Not clear that he is truly committed to stopping. He is not ready to make a plan or set a quit date.

## 2016-12-29 NOTE — Patient Instructions (Signed)
Please take azithromycin as prescribed until completely gone We will check to see how well insurance will cover [place]. If the cost is allowable then we may decide to change this medication Continue Symbicort twice a day for now.  Take albuterol 2 puffs up to every 4 hours if needed for shortness of breath.  You need to work hard on decreasing her cigarette smoking. This is the most important thing that you can do to improve your breathing and to prevent flareups of COPD. We do not need to repeat your Ct scan  Follow with Dr Lamonte Sakai in 4 months or sooner if you have any problems.

## 2016-12-29 NOTE — Assessment & Plan Note (Signed)
Without any current wheezing. He does have purulent mucous. We will treat with azithromycin, defer steroids

## 2016-12-29 NOTE — Assessment & Plan Note (Signed)
Resolved on serial CT scans. No further CT scans indicated at this time.

## 2016-12-29 NOTE — Assessment & Plan Note (Signed)
I would like to try change him to a LABA / LAMA. Will investigate cost Stiolto w his insurance. It is too expensive for him to get from [place]. Try transition from Symbicort to Stiolto if affordable. Continue albuterol as needed.  We will check to see how well insurance will cover [place]. If the cost is allowable then we may decide to change this medication Continue Symbicort twice a day for now.  Take albuterol 2 puffs up to every 4 hours if needed for shortness of breath.  Follow with Dr Lamonte Sakai in 4 months or sooner if you have any problems.

## 2016-12-29 NOTE — Progress Notes (Signed)
Subjective:    Patient ID: Manuel Leonard, male    DOB: [birth year], 66 y.o.   MRN: [id number]  HPI 66 yo smoker (40 pk-yrs), hx HTN, GERD, COPD, renal cysts and hematuria. Was discovered to have a 2-96mm LLL nodule, probably calcified.   He has cough, happens daily. He has GERD that is only moderately controlled Able to work and exert. He does not use symbicort regularly.   ROV 12/28/15 -- the patient has a history of tobacco use and COPD with associated hypoxemia. He has been seen in our office by T Parrett most recently in February 2017. Review of the notes shows that he was admitted in February 2016 and February 2017 with suspected pneumonia. She did with antibiotics and steroids. In 2016 he had a CT scan of his chest that showed numerable inflammatory nodules with associated mediastinal lymphadenopathy. This was never followed up. He is using symbicort reliably, occasionally uses more than that. He has been having some increased chest tightness, some bronchitic sx, yellow mucous. He [facility] just [facility]. He continues to smoke around 1 pk a day.   ROV 08/29/16 -- This is a follow-up visit for active tobacco use, COPD,  pulmonary nodules that we have followed by CT scan of the chest. His most recent CT scan of the chest was done on 01/01/16 and showed full resolution of his nodular disease, with the exception of a 2 mm left nodule. No follow-ups are planned. Treated for an acute exacerbation of his COPD in October by his primary care physician. He is currently using Symbicort. Tells me that he sometimes takes it more than twice a day. We had planned to add Spiriva to this. He did not benefit so he stopped it.  He is using albuterol nebs as needed, a few times a week. He is still smoking a pack a day. He is having a lot of productive cough. He is also having some upper abd / lower chest pain.   ROV 12/29/16 -- Patient has a history of tobacco use (trying to quit using vapor),  COPD, nodular disease that we have followed by CT scan of the chest, had resolved as of his last scan 1 year ago. Currently managed on Symbicort twice a day. At our last visit we discussed investigating the costs of LABA / LAMA's [place]. He reports that her began to have increased cough, for the last week, productive white / thick, sometimes yellow. He is having some L flank, costal marginal pain. He has more problems when he sleeps on his L side. He is using SABA several times a day. He has dyspnea w exertion. He is smoking a pack a day.    Review of Systems  Constitutional: Negative for fever and unexpected weight change.  HENT: Positive for sore throat. Negative for congestion, dental problem, ear pain, nosebleeds, postnasal drip, rhinorrhea, sinus pressure, sneezing and trouble swallowing.   Eyes: Negative for redness and itching.  Respiratory: Positive for cough. Negative for chest tightness, shortness of breath and wheezing.   Cardiovascular: Positive for chest pain. Negative for palpitations and leg swelling.  Gastrointestinal: Negative for nausea and vomiting.  Genitourinary: Negative for dysuria.  Musculoskeletal: Negative for joint swelling.  Skin: Negative for rash.  Neurological: Negative for headaches.  Hematological: Does not bruise/bleed easily.  Psychiatric/Behavioral: Negative for dysphoric mood. The patient is not nervous/anxious.    Past Medical History:  Diagnosis Date  . Adenomatous polyps   .  COPD (chronic obstructive pulmonary disease) ([place])   . Emphysema/COPD ([place])   . GERD (gastroesophageal reflux disease)   . Hypertension    not on meds   . Pneumonia    hx of 2017   . Tobacco abuse      Family History  Problem Relation Age of Onset  . Lung cancer Brother   . Stomach cancer Brother   . Cancer Father      Social History   Social History  . Marital status: Married    Spouse name: N/A  . Number of children: N/A  . Years of  education: N/A   Occupational History  . Restaurant owner    Social History Main Topics  . Smoking status: Current Every Day Smoker    Packs/day: 1.00    Years: 48.00    Types: Cigarettes  . Smokeless tobacco: Never Used  . Alcohol use 0.0 oz/week     Comment: occasional glass of wine or beer   . Drug use: No  . Sexual activity: Not on file   Other Topics Concern  . Not on file   Social History Narrative  . No narrative on file  Never exposed to TB to his knowledge Originally from [place], has lived here for 26 yrs.  He works in a [employer]   No Known Allergies   Outpatient Medications Prior to Visit  Medication Sig Dispense Refill  . albuterol (PROVENTIL HFA;VENTOLIN HFA) 108 (90 Base) MCG/ACT inhaler Inhale 2 puffs into the lungs every 6 (six) hours as needed for wheezing or shortness of breath. 1 Inhaler 5  . albuterol (PROVENTIL) (2.5 MG/3ML) 0.083% nebulizer solution TAKE 3 MLS (2.5 MG TOTAL) BY NEBULIZATION EVERY 4 (FOUR) HOURS AS NEEDED FOR SHORTNESS OF BREATH. 75 mL 5  . aspirin EC 81 MG tablet Take 81 mg by mouth daily. Patient takes cardioaspirin 100mg  daily    . budesonide-formoterol (SYMBICORT) 160-4.5 MCG/ACT inhaler Inhale 2 puffs into the lungs 2 (two) times daily. 1 Inhaler 3  . HYDROcodone-acetaminophen (NORCO/VICODIN) 5-325 MG tablet Take 1-2 tablets by mouth every 4 (four) hours as needed for moderate pain. 20 tablet 0  . ipratropium-albuterol (DUONEB) 0.5-2.5 (3) MG/3ML SOLN Take 3 mLs by nebulization. As needed    . OVER THE COUNTER MEDICATION Place 1 drop into both eyes every 6 (six) hours as needed (dry eyes). Artificial tears product from [place].    . predniSONE (DELTASONE) 10 MG tablet Take 4 tablets for 3 days, 3 tablets for 3 days, 2 tablets for 3 days, 1 tablet for 3 days 30 tablet 0  . Tiotropium Bromide Monohydrate (SPIRIVA RESPIMAT) 2.5 MCG/ACT AERS Inhale 2 puffs into the lungs daily. 1 Inhaler 5   No facility-administered medications prior  to visit.          Objective:   Physical Exam Vitals:   12/29/16 0944  BP: 138/80  Pulse: (!) 106  SpO2: 94%   Gen: Pleasant, obese, in no distress,  normal affect  ENT: No lesions,  mouth clear,  oropharynx clear, no postnasal drip  Neck: No JVD, no TMG, no carotid bruits  Lungs: No use of accessory muscles, clear without rales or rhonchi  Cardiovascular: RRR, heart sounds normal, no murmur or gallops, no peripheral edema  Musculoskeletal: No deformities, no cyanosis or clubbing  Neuro: alert, non focal  Skin: Warm, no lesions or rashes       Assessment & Plan:  Acute bronchitis Without any current wheezing. He does have purulent  mucous. We will treat with azithromycin, defer steroids  COPD (chronic obstructive pulmonary disease) I would like to try change him to a LABA / LAMA. Will investigate cost Stiolto w his insurance. It is too expensive for him to get from [place]. Try transition from Symbicort to Stiolto if affordable. Continue albuterol as needed.  We will check to see how well insurance will cover [place]. If the cost is allowable then we may decide to change this medication Continue Symbicort twice a day for now.  Take albuterol 2 puffs up to every 4 hours if needed for shortness of breath.  Follow with Dr Lamonte Sakai in 4 months or sooner if you have any problems.  Multiple pulmonary nodules Resolved on serial CT scans. No further CT scans indicated at this time.  Tobacco abuse Discussed cessation with him today. Not clear that he is truly committed to stopping. He is not ready to make a plan or set a quit date.  Baltazar Apo, MD, PhD 12/29/2016, 9:56 AM [facility] [phone] or if no answer [phone]

## 2016-12-31 ENCOUNTER — Telehealth: Payer: Self-pay | Admitting: Emergency Medicine

## 2016-12-31 NOTE — Telephone Encounter (Signed)
I have spoken with Beth with [facility], who wanted to make [place] aware that pt must first try and file preferred medications. Beth wanted to confirm that pt has not tried and filed anoro, breo, incruse or Striverdi. Per our records it does not appear that pt has tried the following medications.   RB please advise. Thanks.

## 2016-12-31 NOTE — Telephone Encounter (Signed)
Gretch with [facility] calling regarding [place] PA, [phone].

## 2016-12-31 NOTE — Telephone Encounter (Signed)
Dr. Lamonte Sakai, a PA was started for [facility] on 12/30/17. [facility] denied the request. They have the alternatives of Anoro, Breo, Incruse, or Striverdi.   Do you want to appeal the request or change to one of the alternatives listed above? Thanks!

## 2017-01-01 NOTE — Telephone Encounter (Signed)
OK to change the Stiolto to [place], do a trial of this and investigate the cost. Give him samples and have him call us to let us know if he prefers it to Symbicort

## 2017-01-02 NOTE — Telephone Encounter (Signed)
ATC pt. Left message for pt to call back.

## 2017-01-28 NOTE — Telephone Encounter (Signed)
Lmtcb for pt.  

## 2017-02-01 DIAGNOSIS — R05 Cough: Secondary | ICD-10-CM | POA: Diagnosis not present

## 2017-02-01 DIAGNOSIS — J441 Chronic obstructive pulmonary disease with (acute) exacerbation: Secondary | ICD-10-CM | POA: Diagnosis not present

## 2017-02-01 DIAGNOSIS — R0789 Other chest pain: Secondary | ICD-10-CM | POA: Diagnosis not present

## 2017-02-01 DIAGNOSIS — J209 Acute bronchitis, unspecified: Secondary | ICD-10-CM | POA: Diagnosis not present

## 2017-03-13 ENCOUNTER — Telehealth: Payer: Self-pay | Admitting: Emergency Medicine

## 2017-03-13 NOTE — Telephone Encounter (Signed)
Called and spoke to pt's son, Manuel Leonard. Manuel Leonard states the pt is in [place] right now and a family member is going to travel to see pt and will take the albuterol with them for the pt. However, the albuterol was already refilled in 12/2016 with 5 additional refills. Advised Benny to call pharmacy and have med refilled since there are refills remaining. Benny verbalized understanding and denied any further questions or concerns at this time.

## 2017-05-19 ENCOUNTER — Ambulatory Visit: Payer: Medicare Other | Admitting: Emergency Medicine

## 2017-06-15 ENCOUNTER — Ambulatory Visit: Payer: Medicare Other | Admitting: Emergency Medicine

## 2017-07-08 ENCOUNTER — Encounter: Payer: Self-pay | Admitting: Emergency Medicine

## 2017-07-08 ENCOUNTER — Ambulatory Visit (INDEPENDENT_AMBULATORY_CARE_PROVIDER_SITE_OTHER): Payer: Medicare Other | Admitting: Emergency Medicine

## 2017-07-08 DIAGNOSIS — Z23 Encounter for immunization: Secondary | ICD-10-CM

## 2017-07-08 DIAGNOSIS — R918 Other nonspecific abnormal finding of lung field: Secondary | ICD-10-CM | POA: Diagnosis not present

## 2017-07-08 DIAGNOSIS — J449 Chronic obstructive pulmonary disease, unspecified: Secondary | ICD-10-CM

## 2017-07-08 MED ORDER — ALBUTEROL SULFATE HFA 108 (90 BASE) MCG/ACT IN AERS: 2.0000 | INHALATION_SPRAY | Freq: Four times a day (QID) | RESPIRATORY_TRACT | 5 refills | Status: DC | PRN

## 2017-07-08 NOTE — Patient Instructions (Signed)
Please continue Symbicort 2 puffs twice a day as you have been taking it.  Remember to rinse and gargle after using.  We will not restart Spiriva at this time. Keep Ventolin or DuoNeb available to use as needed for any additional shortness of breath, coughing, wheezing. Continue to work on decreasing your cigarettes Flu shot today Agree with your plans to work on weight loss and exercise Follow with Dr Lamonte Sakai in 6 months or sooner if you have any problems

## 2017-07-08 NOTE — Assessment & Plan Note (Signed)
Discussed smoking with him today.  He still smoking a pack a day.  He knows that he needs to quit but he is not prepared to set a quit date at this time.

## 2017-07-08 NOTE — Assessment & Plan Note (Signed)
Please continue Symbicort 2 puffs twice a day as you have been taking it.  Remember to rinse and gargle after using.  We will not restart Spiriva at this time. Keep Ventolin or DuoNeb available to use as needed for any additional shortness of breath, coughing, wheezing. Continue to work on decreasing your cigarettes Flu shot today Agree with your plans to work on weight loss and exercise Follow with Dr Lamonte Sakai in 6 months or sooner if you have any problems

## 2017-07-08 NOTE — Progress Notes (Signed)
Subjective:    Patient ID: Manuel Leonard, male    DOB: Jul 21, 1951, 66 y.o.   MRN: [id number]  HPI 66 yo smoker (40 pk-yrs), hx HTN, GERD, COPD, renal cysts and hematuria. Was discovered to have a 2-94mm LLL nodule, probably calcified.   He has cough, happens daily. He has GERD that is only moderately controlled Able to work and exert. He does not use symbicort regularly.   ROV 12/28/15 -- the patient has a history of tobacco use and COPD with associated hypoxemia. He has been seen in our office by T Parrett most recently in February 2017. Review of the notes shows that he was admitted in February 2016 and February 2017 with suspected pneumonia. She did with antibiotics and steroids. In 2016 he had a CT scan of his chest that showed numerable inflammatory nodules with associated mediastinal lymphadenopathy. This was never followed up. He is using symbicort reliably, occasionally uses more than that. He has been having some increased chest tightness, some bronchitic sx, yellow mucous. He was just [facility]. He continues to smoke around 1 pk a day.   ROV 08/29/16 -- This is a follow-up visit for active tobacco use, COPD,  pulmonary nodules that we have followed by CT scan of the chest. His most recent CT scan of the chest was done on 01/01/16 and showed full resolution of his nodular disease, with the exception of a 2 mm left nodule. No follow-ups are planned. Treated for an acute exacerbation of his COPD in October by his primary care physician. He is currently using Symbicort. Tells me that he sometimes takes it more than twice a day. We had planned to add Spiriva to this. He did not benefit so he stopped it.  He is using albuterol nebs as needed, a few times a week. He is still smoking a pack a day. He is having a lot of productive cough. He is also having some upper abd / lower chest pain.   ROV 12/29/16 -- Patient has a history of tobacco use (trying to quit using vapor),  COPD, nodular disease that we have followed by CT scan of the chest, had resolved as of his last scan 1 year ago. Currently managed on Symbicort twice a day. At our last visit we discussed investigating the costs of LABA / LAMA's [place]. He reports that her began to have increased cough, for the last week, productive white / thick, sometimes yellow. He is having some L flank, costal marginal pain. He has more problems when he sleeps on his L side. He is using SABA several times a day. He has dyspnea w exertion. He is smoking a pack a day.   ROV 07/08/17 --this is a follow-up visit for patient with a history of tobacco use, COPD.  He also has pulmonary nodules that had been followed by CT scan but which resolved and did not require any further follow-up. He tells me that he was recently dx with HTN and DM. He has lost about 30 lbs intentionally. He couldn't afford Stiolto. He is now on Symbicort only, not Spiriva. He doesn't miss the Spiriva. He uses albuterol rarely as long as he is on the Symbicort. He uses duoneb very rarely. He is smoking a pack a day.  Review of Systems  Constitutional: Negative for fever and unexpected weight change.  HENT: Negative for congestion, dental problem, ear pain, nosebleeds, postnasal drip, rhinorrhea, sinus pressure, sneezing, sore throat and trouble swallowing.   Eyes: Negative for redness and itching.  Respiratory: Negative for cough, chest tightness, shortness of breath and wheezing.   Cardiovascular: Negative for chest pain, palpitations and leg swelling.  Gastrointestinal: Negative for nausea and vomiting.  Genitourinary: Negative for dysuria.  Musculoskeletal: Negative for joint swelling.  Skin: Negative for rash.  Neurological: Negative for headaches.  Hematological:  Does not bruise/bleed easily.  Psychiatric/Behavioral: Negative for dysphoric mood. The patient is not nervous/anxious.    Past Medical History:  Diagnosis Date  . Adenomatous polyps   . COPD (chronic obstructive pulmonary disease) ([place])   . Emphysema/COPD ([place])   . GERD (gastroesophageal reflux disease)   . Hypertension    not on meds   . Pneumonia    hx of 2017   . Tobacco abuse      Family History  Problem Relation Age of Onset  . Lung cancer Brother   . Stomach cancer Brother   . Cancer Father      Social History   Socioeconomic History  . Marital status: Married    Spouse name: Not on file  . Number of children: Not on file  . Years of education: Not on file  . Highest education level: Not on file  Social Needs  . Financial resource strain: Not on file  . Food insecurity - worry: Not on file  . Food insecurity - inability: Not on file  . Transportation needs - medical: Not on file  . Transportation needs - non-medical: Not on file  Occupational History  . Occupation: Regulatory affairs officer  Tobacco Use  . Smoking status: Current Every Day Smoker    Packs/day: 1.00    Years: 48.00    Pack years: 48.00    Types: Cigarettes  . Smokeless tobacco: Never Used  Substance and Sexual Activity  . Alcohol use: Yes    Alcohol/week: 0.0 oz    Comment: occasional glass of wine or beer   . Drug use: No  . Sexual activity: Not on file  Other Topics Concern  . Not on file  Social History Narrative  . Not on file  Never exposed to TB to his knowledge Originally from [place], has lived here for 56 yrs.  He works in a [employer]   No Known Allergies   Outpatient Medications Prior to Visit  Medication Sig Dispense Refill  . albuterol (PROVENTIL HFA;VENTOLIN HFA) 108 (90 Base) MCG/ACT inhaler Inhale 2 puffs into the lungs every 6 (six) hours as needed for wheezing or shortness of breath. 1 Inhaler 5  . budesonide-formoterol (SYMBICORT) 160-4.5 MCG/ACT inhaler Inhale 2  puffs into the lungs 2 (two) times daily. 1 Inhaler 3  . ezetimibe-simvastatin (VYTORIN) 10-20 MG tablet Take 1 tablet daily by mouth.    Marland Kitchen ipratropium-albuterol (DUONEB) 0.5-2.5 (3) MG/3ML SOLN Take 3 mLs by nebulization. As needed    . metFORMIN (GLUCOPHAGE) 500 MG tablet Take 2 (two) times daily with a meal by mouth.    . olmesartan (BENICAR) 20 MG tablet Take 20 mg daily by mouth.    . Tiotropium Bromide Monohydrate (SPIRIVA RESPIMAT) 2.5 MCG/ACT AERS Inhale 2 puffs into the lungs daily. 1 Inhaler 5  . aspirin EC 81 MG tablet Take 81 mg by mouth daily. Patient takes cardioaspirin 100mg  daily    . azithromycin (ZITHROMAX)  250 MG tablet Take 2 pills today then one a day for 4 additional days 6 tablet 0  . HYDROcodone-acetaminophen (NORCO/VICODIN) 5-325 MG tablet Take 1-2 tablets by mouth every 4 (four) hours as needed for moderate pain. 20 tablet 0  . OVER THE COUNTER MEDICATION Place 1 drop into both eyes every 6 (six) hours as needed (dry eyes). Artificial tears product from [place].    . predniSONE (DELTASONE) 10 MG tablet Take 4 tablets for 3 days, 3 tablets for 3 days, 2 tablets for 3 days, 1 tablet for 3 days 30 tablet 0  . Tiotropium Bromide-Olodaterol (STIOLTO RESPIMAT) 2.5-2.5 MCG/ACT AERS Inhale 2 puffs into the lungs daily. 1 Inhaler 2   No facility-administered medications prior to visit.          Objective:   Physical Exam Vitals:   07/08/17 1635  BP: 130/78  Pulse: (!) 104  SpO2: 94%  Weight: (!) 302 lb 6 oz (137.2 kg)   Gen: Pleasant, obese, in no distress,  normal affect  ENT: No lesions,  mouth clear,  oropharynx clear, no postnasal drip  Neck: No JVD, no TMG, no carotid bruits  Lungs: No use of accessory muscles, few end-expiratory wheezes at both bases especially on forced expiration  Cardiovascular: RRR, heart sounds normal, no murmur or gallops, no peripheral edema  Musculoskeletal: No deformities, no cyanosis or clubbing  Neuro: alert, non focal  Skin:  Warm, no lesions or rashes       Assessment & Plan:  Multiple pulmonary nodules Resolved on most recent CT scan of the chest.  No further CTs indicated  COPD (chronic obstructive pulmonary disease) Please continue Symbicort 2 puffs twice a day as you have been taking it.  Remember to rinse and gargle after using.  We will not restart Spiriva at this time. Keep Ventolin or DuoNeb available to use as needed for any additional shortness of breath, coughing, wheezing. Continue to work on decreasing your cigarettes Flu shot today Agree with your plans to work on weight loss and exercise Follow with Dr Lamonte Sakai in 6 months or sooner if you have any problems  Tobacco abuse Discussed smoking with him today.  He still smoking a pack a day.  He knows that he needs to quit but he is not prepared to set a quit date at this time.  Baltazar Apo, MD, PhD 07/08/2017, 5:08 PM [facility] [phone] or if no answer [phone]

## 2017-07-08 NOTE — Assessment & Plan Note (Signed)
Resolved on most recent CT scan of the chest.  No further CTs indicated

## 2017-09-23 DIAGNOSIS — J440 Chronic obstructive pulmonary disease with acute lower respiratory infection: Secondary | ICD-10-CM | POA: Diagnosis not present

## 2017-12-02 ENCOUNTER — Telehealth: Payer: Self-pay

## 2017-12-02 DIAGNOSIS — R9431 Abnormal electrocardiogram [ECG] [EKG]: Secondary | ICD-10-CM | POA: Diagnosis not present

## 2017-12-02 DIAGNOSIS — E119 Type 2 diabetes mellitus without complications: Secondary | ICD-10-CM | POA: Diagnosis not present

## 2017-12-02 DIAGNOSIS — J449 Chronic obstructive pulmonary disease, unspecified: Secondary | ICD-10-CM | POA: Diagnosis not present

## 2017-12-02 DIAGNOSIS — R079 Chest pain, unspecified: Secondary | ICD-10-CM | POA: Diagnosis not present

## 2017-12-02 NOTE — Telephone Encounter (Signed)
SENT REFERRAL TO SCHEDULING 

## 2017-12-03 DIAGNOSIS — E119 Type 2 diabetes mellitus without complications: Secondary | ICD-10-CM | POA: Insufficient documentation

## 2017-12-03 DIAGNOSIS — R0789 Other chest pain: Secondary | ICD-10-CM

## 2017-12-03 DIAGNOSIS — I10 Essential (primary) hypertension: Secondary | ICD-10-CM | POA: Insufficient documentation

## 2017-12-03 DIAGNOSIS — R9431 Abnormal electrocardiogram [ECG] [EKG]: Secondary | ICD-10-CM

## 2017-12-03 DIAGNOSIS — D369 Benign neoplasm, unspecified site: Secondary | ICD-10-CM | POA: Insufficient documentation

## 2017-12-03 DIAGNOSIS — K219 Gastro-esophageal reflux disease without esophagitis: Secondary | ICD-10-CM | POA: Insufficient documentation

## 2017-12-03 HISTORY — DX: Other chest pain: R07.89

## 2017-12-03 HISTORY — DX: Type 2 diabetes mellitus without complications: E11.9

## 2017-12-03 HISTORY — DX: Abnormal electrocardiogram (ECG) (EKG): R94.31

## 2017-12-04 NOTE — Telephone Encounter (Signed)
NOTES ON FILE 

## 2017-12-06 DIAGNOSIS — R079 Chest pain, unspecified: Secondary | ICD-10-CM | POA: Insufficient documentation

## 2017-12-06 NOTE — Progress Notes (Signed)
Cardiology Office Note:    Date:  12/07/2017   ID:  [name], DOB 02-16-1951, MRN [id number]  PCP:  Orpah Melter, MD  Cardiologist:  Shirlee More, MD   Referring MD: Orpah Melter, MD  ASSESSMENT:    1. Chest pain in adult   2. Chronic obstructive pulmonary disease, unspecified COPD type ([place])   3. Abnormal EKG   4. Elevated blood pressure reading    PLAN:    In order of problems listed above:  1. Having atypical angina of the pattern is not unstable.  Will undergo cardiac CTA for evaluation this will confirm whether he has occluded vessel in the distribution of the right coronary artery and guide if he has flow-limiting stenosis.  I will reassess back in the office afterwards. 2. Stable managed by pulmonary and PCP 3. Stable pattern on repeat today 4. Ideally I would have started him on antihypertensive therapy using either rate limiting calcium channel blocker or ACE ARB but he did not want to accept additional medical treatment this time and tells me his blood pressure is normal outside of my office.  Next appointment 4 weeks   Medication Adjustments/Labs and Tests Ordered: Current medicines are reviewed at length with the patient today.  Concerns regarding medicines are outlined above.  Orders Placed This Encounter  Procedures  . CT CORONARY MORPH W/CTA COR W/SCORE W/CA W/CM &/OR WO/CM  . CT CORONARY FRACTIONAL FLOW RESERVE DATA PREP  . CT CORONARY FRACTIONAL FLOW RESERVE FLUID ANALYSIS  . Basic Metabolic Panel (BMET)  . EKG 12-Lead   No orders of the defined types were placed in this encounter.    Chief Complaint  Patient presents with  . New Patient (Initial Visit)    to evaluate chest pressure   . Chest Pain  . Abnormal ECG    History of Present Illness:    Manuel Leonard is a 67 y.o. male with T2DM and COPD  who is being seen today for the evaluation of chest pain at the request of Orpah Melter, MD. EKG 12/02/17 with low voltage  and inferior infarction, old. In the last few weeks he has been having brief left chest pressure without physical activity.  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  He has chronic exertional shortness of breath more than usual activities related to COPD.  He has had no orthopnea palpitation or syncope he has chronic stasis changes in the lower extremities.  EKG was performed as an outpatient showing findings of old inferior MI.  His symptoms are atypical angina we discussed options for evaluation he will undergo cardiac CTA.  His blood pressure is elevated today I would have started him on therapy with a beta-blocker or rate limiting calcium channel blocker due to COPD but he does not want to accept medical therapy for elevated blood pressure.  I will meet him back in my office after his CTA is performed and if he has high risk markers limiting stenosis would benefit from revascularization.  In particular I think that his COPD and body habitus make him a poor candidate for cardiac imaging modalities such as stress echo and nuclear perfusion studies.  He has no history of congenital or rheumatic heart disease  Past Medical History:  Diagnosis Date  . Abnormal EKG 12/03/2017  . Acute bronchitis 12/29/2016  . Adenomatous polyps   . Chest discomfort 12/03/2017  .  COPD (chronic obstructive pulmonary disease) ([employer])   . Diabetes mellitus ([place]) 12/03/2017  . Emphysema/COPD ([place])   . GERD (gastroesophageal reflux disease)   . Hyperlipidemia 12/21/2015  . Hypertension    not on meds   . Leukocytosis 09/27/2014  . Multiple pulmonary nodules   . Pneumonia    hx of 2017   . Sepsis ([place]) 09/27/2014  . Tobacco abuse   . Umbilical hernia 02/05/4314    Past Surgical History:  Procedure Laterality Date  . APPENDECTOMY    . HEMORRHOIDECTOMY WITH HEMORRHOID BANDING    . HERNIA REPAIR     bilateral   . INSERTION OF  MESH N/A 10/21/2016   Procedure: INSERTION OF MESH PATCH;  Surgeon: Armandina Gemma, MD;  Location: [facility];  Service: General;  Laterality: N/A;  . right index finger surgery - partial amputation     . TONSILLECTOMY    . UMBILICAL HERNIA REPAIR N/A 10/21/2016   Procedure: UMBILICAL HERNIA REPAIR;  Surgeon: Armandina Gemma, MD;  Location: [facility];  Service: General;  Laterality: N/A;    Current Medications: Current Meds  Medication Sig  . aspirin 325 MG EC tablet Take 325 mg by mouth daily.  . budesonide-formoterol (SYMBICORT) 160-4.5 MCG/ACT inhaler Inhale 2 puffs into the lungs 2 (two) times daily.  Marland Kitchen ipratropium-albuterol (DUONEB) 0.5-2.5 (3) MG/3ML SOLN Take 3 mLs by nebulization. As needed  . metFORMIN (GLUCOPHAGE) 500 MG tablet Take 2 (two) times daily with a meal by mouth.  . nitroGLYCERIN (NITROSTAT) 0.4 MG SL tablet Place 0.4 mg under the tongue every 5 (five) minutes as needed for chest pain.     Allergies:   Patient has no known allergies.   Social History   Socioeconomic History  . Marital status: Married    Spouse name: Not on file  . Number of children: Not on file  . Years of education: Not on file  . Highest education level: Not on file  Occupational History  . Occupation: Regulatory affairs officer  Social Needs  . Financial resource strain: Not on file  . Food insecurity:    Worry: Not on file    Inability: Not on file  . Transportation needs:    Medical: Not on file    Non-medical: Not on file  Tobacco Use  . Smoking status: Current Every Day Smoker    Packs/day: 1.00    Years: 48.00    Pack years: 48.00    Types: Cigarettes  . Smokeless tobacco: Never Used  Substance and Sexual Activity  . Alcohol use: Yes    Alcohol/week: 0.0 oz    Comment: occasional glass of wine or beer   . Drug use: No  . Sexual activity: Not on file  Lifestyle  . Physical activity:    Days per week: Not on file    Minutes per session: Not on file  . Stress: Not on file  Relationships  .  Social connections:    Talks on phone: Not on file    Gets together: Not on file    Attends religious service: Not on file    Active member of club or organization: Not on file    Attends meetings of clubs or organizations: Not on file    Relationship status: Not on file  Other Topics Concern  . Not on file  Social History Narrative  . Not on file     Family History: The patient's family history includes Cancer in his father; Lung cancer in his brother; Stomach  cancer in his brother.  ROS:   Review of Systems  Constitution: Positive for diaphoresis and malaise/fatigue.  HENT: Negative.   Eyes: Negative.   Cardiovascular: Positive for chest pain, dyspnea on exertion and leg swelling.  Respiratory: Positive for cough, shortness of breath and wheezing.   Endocrine: Negative.   Hematologic/Lymphatic: Negative.   Skin: Negative.   Musculoskeletal: Positive for back pain and joint pain.  Gastrointestinal: Negative.   Genitourinary: Negative.   Neurological: Negative.   Psychiatric/Behavioral: The patient is nervous/anxious.    Please see the history of present illness.     All other systems reviewed and are negative.  EKGs/Labs/Other Studies Reviewed:    The following studies were reviewed today:   EKG:  EKG is  ordered today.  The ekg ordered today demonstrates Sinus rhythm same pattern of old inferior MI no acute EKG  Recent Labs: No results found for requested labs within last 8760 hours.  Recent Lipid Panel No results found for: CHOL, TRIG, HDL, CHOLHDL, VLDL, LDLCALC, LDLDIRECT  Physical Exam:    VS:  BP 130/88 (BP Location: Left Arm, Patient Position: Sitting, Cuff Size: Large)   Pulse 94   Wt (!) 308 lb 12.8 oz (140.1 kg)   SpO2 98%   BMI 43.07 kg/m     Wt Readings from Last 3 Encounters:  12/07/17 (!) 308 lb 12.8 oz (140.1 kg)  07/08/17 (!) 302 lb 6 oz (137.2 kg)  10/21/16 (!) 315 lb (142.9 kg)     GEN: He has marked obesity we will discuss this at his  next visit  in no acute distress HEENT: Normal NECK: No JVD; No carotid bruits LYMPHATICS: No lymphadenopathy CARDIAC: RRR, no murmurs, rubs, gallops RESPIRATORY:   Hyperinflated prolonged expiration and expiratory wheezing ABDOMEN: Soft, non-tender, non-distended MUSCULOSKELETAL: 1+ bilateral at the ankles edema with stasis changes edema; No deformity  SKIN: Warm and dry NEUROLOGIC:  Alert and oriented x 3 PSYCHIATRIC:  Normal affect     Signed, Shirlee More, MD  12/07/2017 8:41 AM    [place]

## 2017-12-07 ENCOUNTER — Ambulatory Visit (INDEPENDENT_AMBULATORY_CARE_PROVIDER_SITE_OTHER): Payer: Medicare Other | Admitting: Cardiology

## 2017-12-07 ENCOUNTER — Encounter: Payer: Self-pay | Admitting: Cardiology

## 2017-12-07 VITALS — BP 130/88 | HR 94 | Wt 308.8 lb

## 2017-12-07 DIAGNOSIS — R9431 Abnormal electrocardiogram [ECG] [EKG]: Secondary | ICD-10-CM

## 2017-12-07 DIAGNOSIS — R030 Elevated blood-pressure reading, without diagnosis of hypertension: Secondary | ICD-10-CM | POA: Diagnosis not present

## 2017-12-07 DIAGNOSIS — R079 Chest pain, unspecified: Secondary | ICD-10-CM | POA: Diagnosis not present

## 2017-12-07 DIAGNOSIS — J449 Chronic obstructive pulmonary disease, unspecified: Secondary | ICD-10-CM | POA: Diagnosis not present

## 2017-12-07 MED ORDER — METOPROLOL TARTRATE 50 MG PO TABS: 50.0000 mg | ORAL_TABLET | Freq: Once | ORAL | 0 refills | Status: DC

## 2017-12-07 NOTE — Patient Instructions (Addendum)
Medication Instructions:  Your physician recommends that you continue on your current medications as directed. Please refer to the Current Medication list given to you today.  Labwork: Your physician recommends that you have the following labs drawn: BMP  Testing/Procedures: You had an EKG today.  Your physician has requested that you have cardiac CT. Cardiac computed tomography (CT) is a painless test that uses an x-ray machine to take clear, detailed pictures of your heart. For further information please visit [email or web address].tn. Please follow instruction sheet as given.  Please arrive at the [facility] main entrance of [facility] at xx:xx AM (30-45 minutes prior to test start time)  [facility] [place], [place] [address] [phone]  Proceed to the [facility] Radiology Department (First Floor).  Please follow these instructions carefully (unless otherwise directed):  Hold all erectile dysfunction medications at least 48 hours prior to test.  On the Night Before the Test: . Drink plenty of water. . Do not consume any caffeinated/decaffeinated beverages or chocolate 12 hours prior to your test. . Do not take any antihistamines 12 hours prior to your test. . If you take Metformin do not take 24 hours prior to test.  On the Day of the Test: . Drink plenty of water. Do not drink any water within one hour of the test. . Do not eat any food 4 hours prior to the test. . You may take your regular medications prior to the test. . IF NOT ON A BETA BLOCKER - Take 50 mg of lopressor (metoprolol) one hour before the test.  After the Test: . Drink plenty of water. . After receiving IV contrast, you may experience a mild flushed feeling. This is normal. . On occasion, you may experience a mild rash up to 24 hours after the test. This is not dangerous. If this occurs, you can take Benadryl 25 mg and increase your fluid intake. . If you experience  trouble breathing, this can be serious. If it is severe call 911 IMMEDIATELY. If it is mild, please call our office. . If you take any of these medications: Glipizide/Metformin, Avandament, Glucavance, please do not take 48 hours after completing test.  Follow-Up: Your physician recommends that you schedule a follow-up appointment in: 4 weeks.  Any Other Special Instructions Will Be Listed Below (If Applicable).     If you need a refill on your cardiac medications before your next appointment, please call your pharmacy.    Stasis Dermatitis Stasis dermatitis is a long-term (chronic) skin condition that happens when veins can no longer pump blood back to the heart (poor circulation). This condition causes a red or brown scaly rash or sores (ulcers) from the pooling of blood (stasis). This condition usually affects the lower legs. It may affect one leg or both legs. Without treatment, severe stasis dermatitis can lead to other skin conditions and infections. What are the causes? This condition is caused by poor circulation. What increases the risk? This condition is more likely to develop in people who:  Are not very active.  Stand for long periods of time.  Have veins that have become enlarged and twisted (varicose veins).  Have leg veins that are not strong enough to send blood back to the heart (venous insufficiency).  Have had a blood clot.  Have been pregnant many times.  Have had vein surgery.  Are obese.  Have heart or kidney failure.  Are 37 years of age or older.  What  are the signs or symptoms? Common early symptoms of this condition include:  Swelling in your ankle or leg. This might get better overnight but be worse again in the day.  Skin that looks thin on your ankle and leg.  [place] marks that develop slowly.  Skin that is easily irritated or cracked.  Red, swollen skin.  An achy or heavy feeling after you walk or stand for long periods of  time.  Pain.  Later and more severe symptoms of this condition include:  Skin that looks shiny.  Small, open sores (ulcers). These are often red or purple.  Dry, cracking skin.  Skin that feels hard.  Severe itching.  A change in the shape or color of your lower legs.  Severe pain.  Difficulty walking.  How is this diagnosed? Your health care provider may suspect this condition from your symptoms and medical history. Your health care provider will also do a physical exam. You may need to see a health care provider who specializes in skin diseases (dermatologist). You may also have tests to confirm the diagnosis, including:  Blood tests.  Imaging studies to check blood flow (Doppler ultrasound).  Allergy tests.  How is this treated? Treatment for this condition may include medicine, such as:  Corticosteroid creams and ointments.  Non-corticosteroid medicines applied to the skin (topical).  Medicine to reduce swelling in the legs (diuretics).  Antibiotics.  Medicine to relieve itching (antihistamines).  You may also have to wear:  Compression stockings or an elastic wrap to improve circulation.  A bandage (dressing).  A wrap that contains zinc and gelatin (Unna boot).  Follow these instructions at home: [place] your skin as told by your health care provider. Do not use moisturizers with fragrance. This can irritate your skin.  Apply cool compresses to the affected areas.  Do not scratch your skin.  Do not rub your skin dry after a bath or shower. Gently pat your skin dry.  Do not use scented soaps, detergents, or perfumes. Medicines  Take or use over-the-counter and prescription medicines only as told by your health care provider.  If you were prescribed an antibiotic medicine, take or use it as told by your health care provider. Do not stop taking or using the antibiotic even if your condition starts to improve. Lifestyle  Do not  stand or sit in one position for long periods of time.  Do not cross your legs when you sit.  Raise (elevate) your legs above the level of your heart when you are sitting or lying down.  Walk as told by your health care provider. Walking increases blood flow.  Wear comfortable, loose-fitting clothing. Circulation in your legs will be worse if you wear tight pants, belts, and waistbands. General instructions  Change and remove any dressing as told by your health care provider, if this applies.  Wear compression stockings as told by your health care provider, if this applies. These stockings help to prevent blood clots and reduce swelling in your legs.  Wear the [employer] as told by your health care provider, if this applies.  Keep all follow-up visits as told by your health care provider. This is important. Contact a health care provider if:  Your condition does not improve with treatment.  Your condition gets worse.  You have signs of infection in the affected area. Watch for: ? Swelling. ? Tenderness. ? Redness. ? Soreness. ? Warmth.  You have a fever. Get help right away  if:  You notice red streaks coming from the affected area.  Your bone or joint underneath the affected area becomes painful after the skin has healed.  The affected area turns darker.  You feel a deep pain in your leg or groin.  You are short of breath. This information is not intended to replace advice given to you by your health care provider. Make sure you discuss any questions you have with your health care provider. Document Released: 11/20/2005 Document Revised: 04/08/2016 Document Reviewed: 12/27/2014 Elsevier Interactive Patient Education  [employer].

## 2017-12-08 LAB — BASIC METABOLIC PANEL
BUN / CREAT RATIO: 17 (ref 10–24)
BUN: 19 mg/dL (ref 8–27)
CO2: 23 mmol/L (ref 20–29)
CREATININE: 1.14 mg/dL (ref 0.76–1.27)
Calcium: 9.8 mg/dL (ref 8.6–10.2)
Chloride: 100 mmol/L (ref 96–106)
GFR calc Af Amer: 77 mL/min/{1.73_m2} (ref >59)
GFR calc non Af Amer: 67 mL/min/{1.73_m2} (ref >59)
GLUCOSE: 124 mg/dL — AB (ref 65–99)
Potassium: 5.2 mmol/L (ref 3.5–5.2)
SODIUM: 145 mmol/L — AB (ref 134–144)

## 2017-12-31 DIAGNOSIS — H04123 Dry eye syndrome of bilateral lacrimal glands: Secondary | ICD-10-CM | POA: Diagnosis not present

## 2017-12-31 DIAGNOSIS — E119 Type 2 diabetes mellitus without complications: Secondary | ICD-10-CM | POA: Diagnosis not present

## 2017-12-31 DIAGNOSIS — H01022 Squamous blepharitis right lower eyelid: Secondary | ICD-10-CM | POA: Diagnosis not present

## 2017-12-31 DIAGNOSIS — H01024 Squamous blepharitis left upper eyelid: Secondary | ICD-10-CM | POA: Diagnosis not present

## 2017-12-31 DIAGNOSIS — H40033 Anatomical narrow angle, bilateral: Secondary | ICD-10-CM | POA: Diagnosis not present

## 2017-12-31 DIAGNOSIS — H01025 Squamous blepharitis left lower eyelid: Secondary | ICD-10-CM | POA: Diagnosis not present

## 2017-12-31 DIAGNOSIS — H01021 Squamous blepharitis right upper eyelid: Secondary | ICD-10-CM | POA: Diagnosis not present

## 2018-01-06 ENCOUNTER — Ambulatory Visit: Payer: Medicare Other | Admitting: Cardiology

## 2018-01-07 ENCOUNTER — Other Ambulatory Visit: Payer: Self-pay | Admitting: Cardiology

## 2018-01-07 DIAGNOSIS — R072 Precordial pain: Secondary | ICD-10-CM

## 2018-01-13 ENCOUNTER — Ambulatory Visit (HOSPITAL_COMMUNITY)
Admission: RE | Admit: 2018-01-13 | Discharge: 2018-01-13 | Disposition: A | Discharge status: Home / Self Care | Payer: Medicare Other | Source: Ambulatory Visit | Attending: Cardiology | Admitting: Cardiology

## 2018-01-13 DIAGNOSIS — R072 Precordial pain: Secondary | ICD-10-CM

## 2018-01-13 DIAGNOSIS — R079 Chest pain, unspecified: Secondary | ICD-10-CM | POA: Diagnosis not present

## 2018-01-13 LAB — POCT I-STAT CREATININE: CREATININE: 1 mg/dL (ref 0.61–1.24)

## 2018-01-13 MED ORDER — NITROGLYCERIN 0.4 MG SL SUBL
SUBLINGUAL_TABLET | SUBLINGUAL | Status: AC
  Filled 2018-01-13: qty 2

## 2018-01-13 MED ORDER — METOPROLOL TARTRATE 5 MG/5ML IV SOLN
INTRAVENOUS | Status: AC
  Filled 2018-01-13: qty 10

## 2018-01-13 MED ORDER — METOPROLOL TARTRATE 5 MG/5ML IV SOLN
5.0000 mg | INTRAVENOUS | Status: AC | PRN
  Administered 2018-01-13 (×2): 5 mg via INTRAVENOUS

## 2018-01-13 MED ORDER — METOPROLOL TARTRATE 5 MG/5ML IV SOLN
INTRAVENOUS | Status: AC
  Filled 2018-01-13: qty 5

## 2018-01-13 MED ORDER — METOPROLOL TARTRATE 5 MG/5ML IV SOLN
INTRAVENOUS | Status: AC
  Administered 2018-01-13: 5 mg
  Filled 2018-01-13: qty 20

## 2018-01-13 MED ORDER — NITROGLYCERIN 0.4 MG SL SUBL: 0.8000 mg | SUBLINGUAL_TABLET | SUBLINGUAL | Status: DC | PRN

## 2018-01-13 MED ORDER — METOPROLOL TARTRATE 5 MG/5ML IV SOLN
5.0000 mg | Freq: Once | INTRAVENOUS | Status: AC
Start: 2018-01-13 — End: 2018-01-13
  Administered 2018-01-13 (×3): 5 mg via INTRAVENOUS

## 2018-01-13 MED ORDER — NITROGLYCERIN 0.4 MG SL SUBL
0.8000 mg | SUBLINGUAL_TABLET | Freq: Once | SUBLINGUAL | Status: AC
  Administered 2018-01-13: 0.8 mg via SUBLINGUAL

## 2018-01-13 NOTE — Progress Notes (Signed)
CT scan completed. Tolerated well. D/C home walking with wife. Awake and alert. In no distress.

## 2018-01-14 ENCOUNTER — Ambulatory Visit: Payer: Medicare Other | Admitting: Cardiology

## 2018-01-14 MED ORDER — IOPAMIDOL (ISOVUE-370) INJECTION 76%
100.0000 mL | Freq: Once | INTRAVENOUS | Status: AC | PRN
  Administered 2018-01-14: 80 mL via INTRAVENOUS

## 2018-01-20 NOTE — Progress Notes (Signed)
Cardiology Office Note:    Date:  01/22/2018   ID:  Manuel Leonard, DOB 08-28-1950, MRN [id number]  PCP:  Manuel Melter, MD  Cardiologist:  Manuel More, MD    Referring MD: Manuel Melter, MD    ASSESSMENT:    1. Chest pain in adult   2. Elevated blood pressure reading    PLAN:    In order of problems listed above:  1. His cardiac CTA was quite close to normal with no coronary stenosis or obstructive CAD he is reassured by the findings.  Only further follow-up my office as needed 2. BP at target today would not initiate antihypertensive therapy   Next appointment as needed   Medication Adjustments/Labs and Tests Ordered: Current medicines are reviewed at length with the patient today.  Concerns regarding medicines are outlined above.  No orders of the defined types were placed in this encounter.  Meds ordered this encounter  Medications  . aspirin 81 MG EC tablet    Sig: Take 4 tablets (325 mg total) by mouth daily.    Dispense:  30 tablet    Refill:  3    Chief Complaint  Patient presents with  . Follow-up  . Chest Pain    History of Present Illness:    Manuel Leonard is a 67 y.o. male with a hx of T2 DM and COPD last seen 12/07/17 for chest pain..  Cardiac CTA: IMPRESSION: 1. Coronary artery calcium score 4 Agatston units. This places the patient in the 23rd percentile for age and gender, suggesting low risk for future cardiac events. 2.  No obstructive coronary disease.  Compliance with diet, lifestyle and medications: Yes  He is reassured by the findings of his cardiac CTA he has had no further chest pain dyspnea palpitation or syncope Past Medical History:  Diagnosis Date  . Abnormal EKG 12/03/2017  . Acute bronchitis 12/29/2016  . Adenomatous polyps   . Chest discomfort 12/03/2017  . COPD (chronic obstructive pulmonary disease) ([place])   . Diabetes mellitus ([place]) 12/03/2017  . Emphysema/COPD ([place])   . GERD (gastroesophageal reflux  disease)   . Hyperlipidemia 12/21/2015  . Hypertension    not on meds   . Leukocytosis 09/27/2014  . Multiple pulmonary nodules   . Pneumonia    hx of 2017   . Sepsis ([place]) 09/27/2014  . Tobacco abuse   . Umbilical hernia 0/94/0768    Past Surgical History:  Procedure Laterality Date  . APPENDECTOMY    . HEMORRHOIDECTOMY WITH HEMORRHOID BANDING    . HERNIA REPAIR     bilateral   . INSERTION OF MESH N/A 10/21/2016   Procedure: INSERTION OF MESH PATCH;  Surgeon: Armandina Gemma, MD;  Location: [facility];  Service: General;  Laterality: N/A;  . right index finger surgery - partial amputation     . TONSILLECTOMY    . UMBILICAL HERNIA REPAIR N/A 10/21/2016   Procedure: UMBILICAL HERNIA REPAIR;  Surgeon: Armandina Gemma, MD;  Location: [facility];  Service: General;  Laterality: N/A;    Current Medications: Current Meds  Medication Sig  . aspirin 81 MG EC tablet Take 4 tablets (325 mg total) by mouth daily.  . budesonide-formoterol (SYMBICORT) 160-4.5 MCG/ACT inhaler Inhale 2 puffs into the lungs 2 (two) times daily.  Marland Kitchen ipratropium-albuterol (DUONEB) 0.5-2.5 (3) MG/3ML SOLN Take 3 mLs by nebulization. As needed  . nitroGLYCERIN (NITROSTAT) 0.4 MG SL tablet Place 0.4 mg under the tongue every 5 (five) minutes as needed for chest pain.  . [  DISCONTINUED] aspirin 325 MG EC tablet Take 325 mg by mouth daily.     Allergies:   Patient has no known allergies.   Social History   Socioeconomic History  . Marital status: Married    Spouse name: Not on file  . Number of children: Not on file  . Years of education: Not on file  . Highest education level: Not on file  Occupational History  . Occupation: Regulatory affairs officer  Social Needs  . Financial resource strain: Not on file  . Food insecurity:    Worry: Not on file    Inability: Not on file  . Transportation needs:    Medical: Not on file    Non-medical: Not on file  Tobacco Use  . Smoking status: Current Every Day Smoker    Packs/day: 1.00     Years: 48.00    Pack years: 48.00    Types: Cigarettes  . Smokeless tobacco: Never Used  Substance and Sexual Activity  . Alcohol use: Yes    Alcohol/week: 0.0 oz    Comment: occasional glass of wine or beer   . Drug use: No  . Sexual activity: Not on file  Lifestyle  . Physical activity:    Days per week: Not on file    Minutes per session: Not on file  . Stress: Not on file  Relationships  . Social connections:    Talks on phone: Not on file    Gets together: Not on file    Attends religious service: Not on file    Active member of club or organization: Not on file    Attends meetings of clubs or organizations: Not on file    Relationship status: Not on file  Other Topics Concern  . Not on file  Social History Narrative  . Not on file     Family History: The patient's family history includes Cancer in his father; Lung cancer in his brother; Stomach cancer in his brother. ROS:   Please see the history of present illness.    All other systems reviewed and are negative.  EKGs/Labs/Other Studies Reviewed:    The following studies were reviewed today:  Recent Labs: 12/07/2017: BUN 19; Potassium 5.2; Sodium 145 01/13/2018: Creatinine, Ser 1.00  Recent Lipid Panel No results found for: CHOL, TRIG, HDL, CHOLHDL, VLDL, LDLCALC, LDLDIRECT  Physical Exam:    VS:  BP 122/81   Pulse 96   Ht 5\' 11"  (1.803 m)   Wt (!) 320 lb (145.2 kg)   SpO2 98%   BMI 44.63 kg/m     Wt Readings from Last 3 Encounters:  01/22/18 (!) 320 lb (145.2 kg)  12/07/17 (!) 308 lb 12.8 oz (140.1 kg)  07/08/17 (!) 302 lb 6 oz (137.2 kg)     GEN:  Well nourished, well developed in no acute distress HEENT: Normal NECK: No JVD; No carotid bruits LYMPHATICS: No lymphadenopathy CARDIAC: RRR, no murmurs, rubs, gallops RESPIRATORY:  Clear to auscultation without rales, wheezing or rhonchi  ABDOMEN: Soft, non-tender, non-distended MUSCULOSKELETAL:  No edema; No deformity  SKIN: Warm and  dry NEUROLOGIC:  Alert and oriented x 3 PSYCHIATRIC:  Normal affect    Signed, Manuel More, MD  01/22/2018 8:23 AM    [place]

## 2018-01-22 ENCOUNTER — Ambulatory Visit (INDEPENDENT_AMBULATORY_CARE_PROVIDER_SITE_OTHER): Payer: Medicare Other | Admitting: Cardiology

## 2018-01-22 ENCOUNTER — Encounter: Payer: Self-pay | Admitting: Cardiology

## 2018-01-22 VITALS — BP 122/81 | HR 96 | Ht 71.0 in | Wt 320.0 lb

## 2018-01-22 DIAGNOSIS — R030 Elevated blood-pressure reading, without diagnosis of hypertension: Secondary | ICD-10-CM

## 2018-01-22 DIAGNOSIS — R079 Chest pain, unspecified: Secondary | ICD-10-CM | POA: Diagnosis not present

## 2018-01-22 MED ORDER — ASPIRIN 81 MG PO TBEC: 81.0000 mg | DELAYED_RELEASE_TABLET | Freq: Every day | ORAL | 3 refills | Status: DC

## 2018-01-22 MED ORDER — ASPIRIN 81 MG PO TBEC: 325.0000 mg | DELAYED_RELEASE_TABLET | Freq: Every day | ORAL | 3 refills | Status: DC

## 2018-01-22 NOTE — Patient Instructions (Addendum)
Medication Instructions:  Your physician has recommended you make the following change in your medication:   DECREASE: Aspirin 325mg  daily to Aspirin 81mg  daily   Labwork: NONE  Testing/Procedures: NONE  Follow-Up: Your physician recommends that you schedule a follow-up if symptoms worsen or fail to improve.   Any Other Special Instructions Will Be Listed Below (If Applicable).     If you need a refill on your cardiac medications before your next appointment, please call your pharmacy.

## 2018-01-26 DIAGNOSIS — R05 Cough: Secondary | ICD-10-CM | POA: Diagnosis not present

## 2018-06-11 DIAGNOSIS — E119 Type 2 diabetes mellitus without complications: Secondary | ICD-10-CM | POA: Diagnosis not present

## 2018-06-11 DIAGNOSIS — F17200 Nicotine dependence, unspecified, uncomplicated: Secondary | ICD-10-CM | POA: Diagnosis not present

## 2018-06-11 DIAGNOSIS — Z125 Encounter for screening for malignant neoplasm of prostate: Secondary | ICD-10-CM | POA: Diagnosis not present

## 2018-06-11 DIAGNOSIS — J449 Chronic obstructive pulmonary disease, unspecified: Secondary | ICD-10-CM | POA: Diagnosis not present

## 2018-06-11 DIAGNOSIS — Z136 Encounter for screening for cardiovascular disorders: Secondary | ICD-10-CM | POA: Diagnosis not present

## 2018-06-25 DIAGNOSIS — H01025 Squamous blepharitis left lower eyelid: Secondary | ICD-10-CM | POA: Diagnosis not present

## 2018-06-25 DIAGNOSIS — E119 Type 2 diabetes mellitus without complications: Secondary | ICD-10-CM | POA: Diagnosis not present

## 2018-06-25 DIAGNOSIS — H01022 Squamous blepharitis right lower eyelid: Secondary | ICD-10-CM | POA: Diagnosis not present

## 2018-06-25 DIAGNOSIS — H01024 Squamous blepharitis left upper eyelid: Secondary | ICD-10-CM | POA: Diagnosis not present

## 2018-06-25 DIAGNOSIS — H10413 Chronic giant papillary conjunctivitis, bilateral: Secondary | ICD-10-CM | POA: Diagnosis not present

## 2018-06-25 DIAGNOSIS — H40033 Anatomical narrow angle, bilateral: Secondary | ICD-10-CM | POA: Diagnosis not present

## 2018-06-25 DIAGNOSIS — H0289 Other specified disorders of eyelid: Secondary | ICD-10-CM | POA: Diagnosis not present

## 2018-06-25 DIAGNOSIS — H01021 Squamous blepharitis right upper eyelid: Secondary | ICD-10-CM | POA: Diagnosis not present

## 2018-06-25 DIAGNOSIS — H04123 Dry eye syndrome of bilateral lacrimal glands: Secondary | ICD-10-CM | POA: Diagnosis not present

## 2018-08-09 DIAGNOSIS — R350 Frequency of micturition: Secondary | ICD-10-CM | POA: Diagnosis not present

## 2018-08-09 DIAGNOSIS — Z23 Encounter for immunization: Secondary | ICD-10-CM | POA: Diagnosis not present

## 2018-08-09 DIAGNOSIS — E119 Type 2 diabetes mellitus without complications: Secondary | ICD-10-CM | POA: Diagnosis not present

## 2018-08-09 DIAGNOSIS — J069 Acute upper respiratory infection, unspecified: Secondary | ICD-10-CM | POA: Diagnosis not present

## 2018-08-09 DIAGNOSIS — R609 Edema, unspecified: Secondary | ICD-10-CM | POA: Diagnosis not present

## 2018-08-31 DIAGNOSIS — H40031 Anatomical narrow angle, right eye: Secondary | ICD-10-CM | POA: Diagnosis not present

## 2018-09-16 DIAGNOSIS — H04123 Dry eye syndrome of bilateral lacrimal glands: Secondary | ICD-10-CM | POA: Diagnosis not present

## 2018-09-16 DIAGNOSIS — H40033 Anatomical narrow angle, bilateral: Secondary | ICD-10-CM | POA: Diagnosis not present

## 2018-09-16 DIAGNOSIS — H0289 Other specified disorders of eyelid: Secondary | ICD-10-CM | POA: Diagnosis not present

## 2018-09-16 DIAGNOSIS — H10413 Chronic giant papillary conjunctivitis, bilateral: Secondary | ICD-10-CM | POA: Diagnosis not present

## 2018-09-20 DIAGNOSIS — J410 Simple chronic bronchitis: Secondary | ICD-10-CM | POA: Diagnosis not present

## 2018-09-20 DIAGNOSIS — J40 Bronchitis, not specified as acute or chronic: Secondary | ICD-10-CM | POA: Diagnosis not present

## 2018-09-20 DIAGNOSIS — Z720 Tobacco use: Secondary | ICD-10-CM | POA: Diagnosis not present

## 2018-09-20 DIAGNOSIS — J069 Acute upper respiratory infection, unspecified: Secondary | ICD-10-CM | POA: Diagnosis not present

## 2019-05-19 ENCOUNTER — Encounter (HOSPITAL_COMMUNITY): Payer: Self-pay | Admitting: Emergency Medicine

## 2019-05-19 ENCOUNTER — Emergency Department (HOSPITAL_COMMUNITY)
Admission: EM | Admit: 2019-05-19 | Discharge: 2019-05-20 | Disposition: A | Discharge status: Home / Self Care | Payer: Medicare Other | Attending: Emergency Medicine | Admitting: Emergency Medicine

## 2019-05-19 ENCOUNTER — Other Ambulatory Visit: Payer: Self-pay

## 2019-05-19 DIAGNOSIS — Z7984 Long term (current) use of oral hypoglycemic drugs: Secondary | ICD-10-CM | POA: Insufficient documentation

## 2019-05-19 DIAGNOSIS — M79605 Pain in left leg: Secondary | ICD-10-CM | POA: Diagnosis not present

## 2019-05-19 DIAGNOSIS — E119 Type 2 diabetes mellitus without complications: Secondary | ICD-10-CM | POA: Insufficient documentation

## 2019-05-19 DIAGNOSIS — M7989 Other specified soft tissue disorders: Secondary | ICD-10-CM | POA: Diagnosis not present

## 2019-05-19 DIAGNOSIS — I824Y2 Acute embolism and thrombosis of unspecified deep veins of left proximal lower extremity: Secondary | ICD-10-CM | POA: Diagnosis not present

## 2019-05-19 DIAGNOSIS — I872 Venous insufficiency (chronic) (peripheral): Secondary | ICD-10-CM | POA: Insufficient documentation

## 2019-05-19 DIAGNOSIS — D571 Sickle-cell disease without crisis: Secondary | ICD-10-CM | POA: Diagnosis not present

## 2019-05-19 DIAGNOSIS — E1143 Type 2 diabetes mellitus with diabetic autonomic (poly)neuropathy: Secondary | ICD-10-CM | POA: Diagnosis not present

## 2019-05-19 DIAGNOSIS — J449 Chronic obstructive pulmonary disease, unspecified: Secondary | ICD-10-CM | POA: Insufficient documentation

## 2019-05-19 DIAGNOSIS — I10 Essential (primary) hypertension: Secondary | ICD-10-CM | POA: Insufficient documentation

## 2019-05-19 DIAGNOSIS — D751 Secondary polycythemia: Secondary | ICD-10-CM | POA: Diagnosis not present

## 2019-05-19 DIAGNOSIS — E1165 Type 2 diabetes mellitus with hyperglycemia: Secondary | ICD-10-CM | POA: Diagnosis not present

## 2019-05-19 DIAGNOSIS — F17210 Nicotine dependence, cigarettes, uncomplicated: Secondary | ICD-10-CM | POA: Diagnosis not present

## 2019-05-19 DIAGNOSIS — R509 Fever, unspecified: Secondary | ICD-10-CM | POA: Insufficient documentation

## 2019-05-19 DIAGNOSIS — Z7982 Long term (current) use of aspirin: Secondary | ICD-10-CM | POA: Insufficient documentation

## 2019-05-19 DIAGNOSIS — M79662 Pain in left lower leg: Secondary | ICD-10-CM

## 2019-05-19 LAB — CBC WITH DIFFERENTIAL/PLATELET
Abs Immature Granulocytes: 0.07 10*3/uL (ref 0.00–0.07)
Basophils Absolute: 0.1 10*3/uL (ref 0.0–0.1)
Basophils Relative: 1 %
Eosinophils Absolute: 0.1 10*3/uL (ref 0.0–0.5)
Eosinophils Relative: 1 %
HCT: 53.5 % — ABNORMAL HIGH (ref 39.0–52.0)
Hemoglobin: 17.9 g/dL — ABNORMAL HIGH (ref 13.0–17.0)
Immature Granulocytes: 1 %
Lymphocytes Relative: 26 %
Lymphs Abs: 2.7 10*3/uL (ref 0.7–4.0)
MCH: 30.9 pg (ref 26.0–34.0)
MCHC: 33.5 g/dL (ref 30.0–36.0)
MCV: 92.2 fL (ref 80.0–100.0)
Monocytes Absolute: 0.9 10*3/uL (ref 0.1–1.0)
Monocytes Relative: 8 %
Neutro Abs: 6.5 10*3/uL (ref 1.7–7.7)
Neutrophils Relative %: 63 %
Platelets: 131 10*3/uL — ABNORMAL LOW (ref 150–400)
RBC: 5.8 MIL/uL (ref 4.22–5.81)
RDW: 13.7 % (ref 11.5–15.5)
WBC: 10.3 10*3/uL (ref 4.0–10.5)
nRBC: 0 % (ref 0.0–0.2)

## 2019-05-19 LAB — COMPREHENSIVE METABOLIC PANEL
ALT: 22 U/L (ref 0–44)
AST: 18 U/L (ref 15–41)
Albumin: 3.9 g/dL (ref 3.5–5.0)
Alkaline Phosphatase: 89 U/L (ref 38–126)
Anion gap: 9 (ref 5–15)
BUN: 12 mg/dL (ref 8–23)
CO2: 30 mmol/L (ref 22–32)
Calcium: 9.4 mg/dL (ref 8.9–10.3)
Chloride: 98 mmol/L (ref 98–111)
Creatinine, Ser: 1.08 mg/dL (ref 0.61–1.24)
GFR calc Af Amer: >60 mL/min (ref >60)
GFR calc non Af Amer: >60 mL/min (ref >60)
Glucose, Bld: 451 mg/dL — ABNORMAL HIGH (ref 70–99)
Potassium: 5.2 mmol/L — ABNORMAL HIGH (ref 3.5–5.1)
Sodium: 137 mmol/L (ref 135–145)
Total Bilirubin: 0.8 mg/dL (ref 0.3–1.2)
Total Protein: 7.1 g/dL (ref 6.5–8.1)

## 2019-05-19 MED ORDER — OXYCODONE-ACETAMINOPHEN 5-325 MG PO TABS
1.0000 | ORAL_TABLET | ORAL | Status: DC | PRN
  Administered 2019-05-19: 20:00:00 1 via ORAL
  Filled 2019-05-19: qty 1

## 2019-05-19 NOTE — ED Triage Notes (Signed)
Pt states his left leg has been swelling for 5 days. It is very painful and warm. Pt was sent here for ultrasound to rule out DVT.

## 2019-05-20 ENCOUNTER — Ambulatory Visit (HOSPITAL_BASED_OUTPATIENT_CLINIC_OR_DEPARTMENT_OTHER)
Admission: RE | Admit: 2019-05-20 | Discharge: 2019-05-20 | Disposition: A | Discharge status: Home / Self Care | Payer: Medicare Other | Source: Ambulatory Visit | Attending: Emergency Medicine | Admitting: Emergency Medicine

## 2019-05-20 ENCOUNTER — Encounter (HOSPITAL_COMMUNITY): Payer: Self-pay | Admitting: Emergency Medicine

## 2019-05-20 ENCOUNTER — Emergency Department (HOSPITAL_COMMUNITY)
Admission: EM | Admit: 2019-05-20 | Discharge: 2019-05-20 | Disposition: A | Discharge status: Home / Self Care | Payer: Medicare Other | Source: Home / Self Care | Attending: Emergency Medicine | Admitting: Emergency Medicine

## 2019-05-20 ENCOUNTER — Other Ambulatory Visit: Payer: Self-pay

## 2019-05-20 DIAGNOSIS — M7989 Other specified soft tissue disorders: Secondary | ICD-10-CM | POA: Diagnosis not present

## 2019-05-20 DIAGNOSIS — I824Y2 Acute embolism and thrombosis of unspecified deep veins of left proximal lower extremity: Secondary | ICD-10-CM

## 2019-05-20 DIAGNOSIS — F17210 Nicotine dependence, cigarettes, uncomplicated: Secondary | ICD-10-CM | POA: Insufficient documentation

## 2019-05-20 DIAGNOSIS — Z7982 Long term (current) use of aspirin: Secondary | ICD-10-CM | POA: Insufficient documentation

## 2019-05-20 DIAGNOSIS — I10 Essential (primary) hypertension: Secondary | ICD-10-CM | POA: Insufficient documentation

## 2019-05-20 DIAGNOSIS — J449 Chronic obstructive pulmonary disease, unspecified: Secondary | ICD-10-CM | POA: Insufficient documentation

## 2019-05-20 DIAGNOSIS — E119 Type 2 diabetes mellitus without complications: Secondary | ICD-10-CM | POA: Insufficient documentation

## 2019-05-20 DIAGNOSIS — M79609 Pain in unspecified limb: Secondary | ICD-10-CM

## 2019-05-20 DIAGNOSIS — Z7984 Long term (current) use of oral hypoglycemic drugs: Secondary | ICD-10-CM | POA: Insufficient documentation

## 2019-05-20 DIAGNOSIS — I872 Venous insufficiency (chronic) (peripheral): Secondary | ICD-10-CM | POA: Diagnosis not present

## 2019-05-20 DIAGNOSIS — Z79899 Other long term (current) drug therapy: Secondary | ICD-10-CM | POA: Insufficient documentation

## 2019-05-20 MED ORDER — RIVAROXABAN (XARELTO) VTE STARTER PACK (15 & 20 MG): ORAL_TABLET | ORAL | 0 refills | Status: DC

## 2019-05-20 MED ORDER — RIVAROXABAN (XARELTO) EDUCATION KIT FOR DVT/PE PATIENTS
PACK | Freq: Once | Status: AC
  Administered 2019-05-20: 11:00:00
  Filled 2019-05-20: qty 1

## 2019-05-20 MED ORDER — RIVAROXABAN 15 MG PO TABS
15.0000 mg | ORAL_TABLET | Freq: Once | ORAL | Status: DC
  Filled 2019-05-20: qty 1

## 2019-05-20 MED ORDER — RIVAROXABAN 20 MG PO TABS: 20.0000 mg | ORAL_TABLET | Freq: Every day | ORAL | 0 refills | Status: DC

## 2019-05-20 MED ORDER — OXYCODONE-ACETAMINOPHEN 5-325 MG PO TABS: 1.0000 | ORAL_TABLET | ORAL | 0 refills | Status: DC | PRN

## 2019-05-20 MED FILL — XARELTO STARTER PACK: 15 & 20 | 30 days supply | Qty: 51 | Fill #0

## 2019-05-20 NOTE — ED Provider Notes (Signed)
[name] EMERGENCY DEPARTMENT Provider Note   CSN: [id number] Arrival date & time: 05/19/19  1820    History   Chief Complaint Chief Complaint  Patient presents with  . Leg Swelling    HPI Manuel Leonard is a 68 y.o. male.  The history is provided by the patient.  He has history of hypertension, hyperlipidemia, diabetes, COPD, GERD and comes in because of pain and swelling in his left leg for the last 5 days.  He has difficulty putting a number on the pain.  It is worse when he walks.  He denies any change in his chronic dyspnea.  He denies fever or chills.  He denies any chest pain.  He was sent here to get a venous Doppler to rule out DVT.  He has no prior history of DVT.  Past Medical History:  Diagnosis Date  . Abnormal EKG 12/03/2017  . Acute bronchitis 12/29/2016  . Adenomatous polyps   . Chest discomfort 12/03/2017  . COPD (chronic obstructive pulmonary disease) ([place])   . Diabetes mellitus ([place]) 12/03/2017  . Emphysema/COPD ([place])   . GERD (gastroesophageal reflux disease)   . Hyperlipidemia 12/21/2015  . Hypertension    not on meds   . Leukocytosis 09/27/2014  . Multiple pulmonary nodules   . Pneumonia    hx of 2017   . Sepsis ([employer]) 09/27/2014  . Tobacco abuse   . Umbilical hernia [id number]    Patient Active Problem List   Diagnosis Date Noted  . Morbid obesity ([place]) 12/07/2017  . Chest pain in adult 12/06/2017  . Chest discomfort 12/03/2017  . Abnormal EKG 12/03/2017  . Hypertension 12/03/2017  . Diabetes mellitus ([place]) 12/03/2017  . GERD (gastroesophageal reflux disease)   . Adenomatous polyps   . Acute bronchitis 12/29/2016  . Umbilical hernia [id number]  . Tobacco abuse 12/28/2015  . Hyperlipidemia 12/21/2015  . Multiple pulmonary nodules   . COPD (chronic obstructive pulmonary disease) ([place]) 09/27/2014  . Leukocytosis 09/27/2014  . Sepsis ([place]) 09/27/2014  . Dyslipidemia     Past Surgical History:  Procedure Laterality Date   . APPENDECTOMY    . HEMORRHOIDECTOMY WITH HEMORRHOID BANDING    . HERNIA REPAIR     bilateral   . INSERTION OF MESH N/A 10/21/2016   Procedure: INSERTION OF MESH PATCH;  Surgeon: Armandina Gemma, MD;  Location: [facility];  Service: General;  Laterality: N/A;  . right index finger surgery - partial amputation     . TONSILLECTOMY    . UMBILICAL HERNIA REPAIR N/A 10/21/2016   Procedure: UMBILICAL HERNIA REPAIR;  Surgeon: Armandina Gemma, MD;  Location: [facility];  Service: General;  Laterality: N/A;        Home Medications    Prior to Admission medications   Medication Sig Start Date End Date Taking? Authorizing Provider  aspirin 81 MG EC tablet Take 1 tablet (81 mg total) by mouth daily. 01/22/18   Richardo Priest, MD  budesonide-formoterol (SYMBICORT) 160-4.5 MCG/ACT inhaler Inhale 2 puffs into the lungs 2 (two) times daily. 10/02/14   Robbie Lis, MD  ipratropium-albuterol (DUONEB) 0.5-2.5 (3) MG/3ML SOLN Take 3 mLs by nebulization. As needed    [provider]  metFORMIN (GLUCOPHAGE) 500 MG tablet Take 2 (two) times daily with a meal by mouth.    [provider]  nitroGLYCERIN (NITROSTAT) 0.4 MG SL tablet Place 0.4 mg under the tongue every 5 (five) minutes as needed for chest pain.    [provider]  Family History Family History  Problem Relation Age of Onset  . Lung cancer Brother   . Stomach cancer Brother   . Cancer Father     Social History Social History   Tobacco Use  . Smoking status: Current Every Day Smoker    Packs/day: 1.00    Years: 48.00    Pack years: 48.00    Types: Cigarettes  . Smokeless tobacco: Never Used  Substance Use Topics  . Alcohol use: Yes    Alcohol/week: 0.0 standard drinks    Comment: occasional glass of wine or beer   . Drug use: No     Allergies   Patient has no known allergies.   Review of Systems Review of Systems  All other systems reviewed and are negative.    Physical Exam Updated Vital Signs BP (!)  123/107 (BP Location: Right Arm)   Pulse 100   Temp 98.5 F (36.9 C) (Oral)   Resp 18   Wt (!) 142.9 kg   SpO2 94%   BMI 43.93 kg/m   Physical Exam Vitals signs and nursing note reviewed.    68 year old male, resting comfortably and in no acute distress. Vital signs are significant for elevated diastolic blood pressure. Oxygen saturation is 94%, which is normal. Head is normocephalic and atraumatic. PERRLA, EOMI. Oropharynx is clear. Neck is nontender and supple without adenopathy or JVD. Back is nontender and there is no CVA tenderness. Lungs have scattered expiratory wheezes without rales or rhonchi. Chest is nontender. Heart has regular rate and rhythm without murmur. Abdomen is soft, flat, nontender without masses or hepatosplenomegaly and peristalsis is normoactive. Extremities: Venous stasis changes are present bilaterally, worse on the left.  There is 1+ edema of the right lower leg and 2-3+ edema of the left lower leg.  Left calf circumference is 3 cm greater than right calf circumference.  There is mild tenderness palpation over the left calf with a negative Homans sign.  No cords are palpable and there is no erythema or warmth. Skin is warm and dry without rash. Neurologic: Mental status is normal, cranial nerves are intact, there are no motor or sensory deficits.  ED Treatments / Results  Labs (all labs ordered are listed, but only abnormal results are displayed) Labs Reviewed  COMPREHENSIVE METABOLIC PANEL - Abnormal; Notable for the following components:      Result Value   Potassium 5.2 (*)    Glucose, Bld 451 (*)    All other components within normal limits  CBC WITH DIFFERENTIAL/PLATELET - Abnormal; Notable for the following components:   Hemoglobin 17.9 (*)    HCT 53.5 (*)    Platelets 131 (*)    All other components within normal limits    Procedures Procedures  Medications Ordered in ED Medications  oxyCODONE-acetaminophen (PERCOCET/ROXICET) 5-325 MG  per tablet 1 tablet (1 tablet Oral Given 05/19/19 2019)     Initial Impression / Assessment and Plan / ED Course  I have reviewed the triage vital signs and the nursing notes.  Pertinent lab results that were available during my care of the patient were reviewed by me and considered in my medical decision making (see chart for details).  Pain and swelling of the left calf concerning for DVT.  However, this may also just be stasis dermatitis.  Screening labs are obtained and showed polycythemia which is unchanged from baseline and presumably secondary to his COPD.  Vascular ultrasound had been ordered, but was unable to be obtained before  the technician left.  He is given a dose of rivaroxaban and will be brought back in the morning for vascular ultrasound.  Old records are reviewed, and he has no relevant past visits.  Final Clinical Impressions(s) / ED Diagnoses   Final diagnoses:  Pain and swelling of left lower leg  Venous stasis dermatitis of both lower extremities  Polycythemia    ED Discharge Orders         Ordered    VAS [place] LOWER EXTREMITY VENOUS (DVT)     05/20/19 0203    oxyCODONE-acetaminophen (PERCOCET) 5-325 MG tablet  Every 4 hours PRN     05/20/19 [id number]           Delora Fuel, MD [id number] [phone]

## 2019-05-20 NOTE — ED Provider Notes (Signed)
[facility] EMERGENCY DEPARTMENT Provider Note   CSN: [id number] Arrival date & time: 05/20/19  0907     History   Chief Complaint Chief Complaint  Patient presents with  . Leg Pain    HPI Manuel Leonard is a 68 y.o. male.     Patient here after outpatient ultrasound showed age-indeterminate blood clots in the left leg.  The history is provided by the patient.  Leg Pain Location:  Leg Time since incident:  6 days Leg location:  L leg Pain details:    Quality:  Aching   Radiates to:  Does not radiate   Severity:  Mild   Onset quality:  Gradual   Timing:  Intermittent   Progression:  Waxing and waning Relieved by:  Nothing Worsened by:  Nothing Associated symptoms: no back pain, no decreased ROM, no fatigue, no fever, no itching, no muscle weakness, no neck pain, no numbness, no stiffness, no swelling and no tingling     Past Medical History:  Diagnosis Date  . Abnormal EKG 12/03/2017  . Acute bronchitis 12/29/2016  . Adenomatous polyps   . Chest discomfort 12/03/2017  . COPD (chronic obstructive pulmonary disease) ([place])   . Diabetes mellitus ([place]) 12/03/2017  . Emphysema/COPD ([place])   . GERD (gastroesophageal reflux disease)   . Hyperlipidemia 12/21/2015  . Hypertension    not on meds   . Leukocytosis 09/27/2014  . Multiple pulmonary nodules   . Pneumonia    hx of 2017   . Sepsis ([place]) 09/27/2014  . Tobacco abuse   . Umbilical hernia 4/62/8638    Patient Active Problem List   Diagnosis Date Noted  . Morbid obesity ([place]) 12/07/2017  . Chest pain in adult 12/06/2017  . Chest discomfort 12/03/2017  . Abnormal EKG 12/03/2017  . Hypertension 12/03/2017  . Diabetes mellitus ([place]) 12/03/2017  . GERD (gastroesophageal reflux disease)   . Adenomatous polyps   . Acute bronchitis 12/29/2016  . Umbilical hernia 17/71/1657  . Tobacco abuse 12/28/2015  . Hyperlipidemia 12/21/2015  . Multiple pulmonary nodules   . COPD (chronic obstructive  pulmonary disease) ([place]) 09/27/2014  . Leukocytosis 09/27/2014  . Sepsis ([place]) 09/27/2014  . Dyslipidemia     Past Surgical History:  Procedure Laterality Date  . APPENDECTOMY    . HEMORRHOIDECTOMY WITH HEMORRHOID BANDING    . HERNIA REPAIR     bilateral   . INSERTION OF MESH N/A 10/21/2016   Procedure: INSERTION OF MESH PATCH;  Surgeon: Armandina Gemma, MD;  Location: [facility];  Service: General;  Laterality: N/A;  . right index finger surgery - partial amputation     . TONSILLECTOMY    . UMBILICAL HERNIA REPAIR N/A 10/21/2016   Procedure: UMBILICAL HERNIA REPAIR;  Surgeon: Armandina Gemma, MD;  Location: [facility];  Service: General;  Laterality: N/A;        Home Medications    Prior to Admission medications   Medication Sig Start Date End Date Taking? Authorizing Provider  aspirin 81 MG EC tablet Take 1 tablet (81 mg total) by mouth daily. 01/22/18   Richardo Priest, MD  budesonide-formoterol (SYMBICORT) 160-4.5 MCG/ACT inhaler Inhale 2 puffs into the lungs 2 (two) times daily. 10/02/14   Robbie Lis, MD  ipratropium-albuterol (DUONEB) 0.5-2.5 (3) MG/3ML SOLN Take 3 mLs by nebulization. As needed    [provider]  metFORMIN (GLUCOPHAGE) 500 MG tablet Take 2 (two) times daily with a meal by mouth.    [provider]  nitroGLYCERIN (NITROSTAT) 0.4 MG SL tablet Place 0.4 mg under the tongue every 5 (five) minutes as needed for chest pain.    [provider]  oxyCODONE-acetaminophen (PERCOCET) 5-325 MG tablet Take 1 tablet by mouth every 4 (four) hours as needed for moderate pain. 1/61/09   Delora Fuel, MD  rivaroxaban (XARELTO) 20 MG TABS tablet Take 1 tablet (20 mg total) by mouth daily with supper. To start after finishing starter pack 06/19/19 07/19/19  Lennice Sites, DO  Rivaroxaban 15 & 20 MG TBPK Follow package directions: Take one 73m tablet by mouth twice a day. On day 22, switch to one 250mtablet once a day. Take with food. 05/20/19   CuLennice SitesDO     Family History Family History  Problem Relation Age of Onset  . Lung cancer Brother   . Stomach cancer Brother   . Cancer Father     Social History Social History   Tobacco Use  . Smoking status: Current Every Day Smoker    Packs/day: 1.00    Years: 48.00    Pack years: 48.00    Types: Cigarettes  . Smokeless tobacco: Never Used  Substance Use Topics  . Alcohol use: Yes    Alcohol/week: 0.0 standard drinks    Comment: occasional glass of wine or beer   . Drug use: No     Allergies   Patient has no known allergies.   Review of Systems Review of Systems  Constitutional: Negative for chills, fatigue and fever.  HENT: Negative for ear pain and sore throat.   Eyes: Negative for pain and visual disturbance.  Respiratory: Negative for cough and shortness of breath.   Cardiovascular: Positive for leg swelling. Negative for chest pain and palpitations.  Gastrointestinal: Negative for abdominal pain and vomiting.  Genitourinary: Negative for dysuria and hematuria.  Musculoskeletal: Negative for arthralgias, back pain, neck pain and stiffness.  Skin: Negative for color change, itching and rash.  Neurological: Negative for seizures and syncope.  All other systems reviewed and are negative.    Physical Exam Updated Vital Signs  ED Triage Vitals  Enc Vitals Group     BP 05/20/19 0917 (!) 150/98     Pulse Rate 05/20/19 0917 100     Resp 05/20/19 0917 19     Temp 05/20/19 0917 98.2 F (36.8 C)     Temp Source 05/20/19 0917 Oral     SpO2 05/20/19 0915 96 %     Weight --      Height --      Head Circumference --      Peak Flow --      Pain Score 05/20/19 0918 2     Pain Loc --      Pain Edu? --      [place]--     Physical Exam Vitals signs and nursing note reviewed.  Constitutional:      Appearance: He is well-developed.  HENT:     Head: Normocephalic and atraumatic.     Nose: Nose normal.     Mouth/Throat:     Mouth: Mucous membranes are moist.   Eyes:     Extraocular Movements: Extraocular movements intact.     Conjunctiva/sclera: Conjunctivae normal.     Pupils: Pupils are equal, round, and reactive to light.  Neck:     Musculoskeletal: Neck supple.  Cardiovascular:     Rate and Rhythm: Normal rate and regular rhythm.     Pulses: Normal pulses.  Heart sounds: Normal heart sounds. No murmur.  Pulmonary:     Effort: Pulmonary effort is normal. No respiratory distress.     Breath sounds: Normal breath sounds.  Abdominal:     General: Abdomen is flat.     Palpations: Abdomen is soft.     Tenderness: There is no abdominal tenderness.  Musculoskeletal: Normal range of motion.        General: Tenderness present.     Right lower leg: No edema.     Left lower leg: Edema (1+) present.  Skin:    General: Skin is warm and dry.     Capillary Refill: Capillary refill takes less than 2 seconds.  Neurological:     General: No focal deficit present.     Mental Status: He is alert.      ED Treatments / Results  Labs (all labs ordered are listed, but only abnormal results are displayed) Labs Reviewed - No data to display  EKG None  Radiology Vas [place] Lower Extremity Venous (dvt)  Result Date: 05/20/2019  Lower Venous Study Indications: Swelling, and Pain.  Limitations: Body habitus and poor ultrasound/tissue interface. Comparison Study: no prior Performing Technologist: Abram Sander RVS  Examination Guidelines: A complete evaluation includes B-mode imaging, spectral Doppler, color Doppler, and power Doppler as needed of all accessible portions of each vessel. Bilateral testing is considered an integral part of a complete examination. Limited examinations for reoccurring indications may be performed as noted. The reflux portion of the exam is performed with the patient in reverse Trendelenburg.  +-----+---------------+---------+-----------+----------+--------------+ RIGHTCompressibilityPhasicitySpontaneityPropertiesThrombus  Aging +-----+---------------+---------+-----------+----------+--------------+ CFV                 Yes      Yes                                 +-----+---------------+---------+-----------+----------+--------------+   +---------+---------------+---------+-----------+----------+-----------------+ LEFT     CompressibilityPhasicitySpontaneityPropertiesThrombus Aging    +---------+---------------+---------+-----------+----------+-----------------+ CFV      Full           Yes      Yes                                    +---------+---------------+---------+-----------+----------+-----------------+ SFJ      Full                                                           +---------+---------------+---------+-----------+----------+-----------------+ FV Prox  Full                                                           +---------+---------------+---------+-----------+----------+-----------------+ FV Mid   Full                                                           +---------+---------------+---------+-----------+----------+-----------------+ FV DistalNone  Age Indeterminate +---------+---------------+---------+-----------+----------+-----------------+ PFV      Full                                                           +---------+---------------+---------+-----------+----------+-----------------+ POP      None           No       No                   Age Indeterminate +---------+---------------+---------+-----------+----------+-----------------+ PTV      Full                                                           +---------+---------------+---------+-----------+----------+-----------------+ PERO                                                  Not visualized    +---------+---------------+---------+-----------+----------+-----------------+     Summary: Right: No evidence of common femoral vein  obstruction. Left: Findings consistent with age indeterminate deep vein thrombosis involving the left femoral vein, and left popliteal vein. No cystic structure found in the popliteal fossa.  *See table(s) above for measurements and observations.    Preliminary     Procedures Procedures (including critical care time)  Medications Ordered in ED Medications  rivaroxaban (XARELTO) Education Kit for DVT/PE patients (has no administration in time range)     Initial Impression / Assessment and Plan / ED Course  I have reviewed the triage vital signs and the nursing notes.  Pertinent labs & imaging results that were available during my care of the patient were reviewed by me and considered in my medical decision making (see chart for details).     Manuel Leonard is a 68 year old male with history of hypertension, high cholesterol, diabetes, COPD who presents to the ED after ultrasound showed age-indeterminate blood clots to his left femoral vein and left popliteal vein.  Patient was seen here last night and given a dose of Xarelto and return for outpatient ultrasound which showed these clots.  No history of clots.  No history of recent surgery.  Does not have any shortness of breath.  Normal vitals.  No history of GI bleeds or other bleeding.  Has good pulses in his legs bilaterally.  Does not have much pain.  Will talk with pharmacy about anticoagulation regimen, may talk with social work about which anticoagulant is most cost effective for the patient.  Does not have any renal issues.  Creatinine yesterday was normal.  Pharmacy recommends Xarelto.  They have ordered Xarelto starter pack.  Patient has been given education about anticoagulation and DVT by myself and pharmacy.  Additional prescription for Xarelto was also sent in to start after he completed starter pack.  Given information to follow-up with hematology.  Has follow-up with primary care doctor next week already.  Given return  precautions.  Discharged in good condition.  This chart was dictated using voice recognition software.  Despite best efforts to proofread,  errors can  occur which can change the documentation meaning.    Final Clinical Impressions(s) / ED Diagnoses   Final diagnoses:  None    ED Discharge Orders         Ordered    rivaroxaban (XARELTO) 20 MG TABS tablet  Daily with supper     05/20/19 1019    Rivaroxaban 15 & 20 MG TBPK     05/20/19 1009           Belissa Kooy, DO 05/20/19 1020

## 2019-05-20 NOTE — Discharge Instructions (Addendum)
Please take Xarelto starter pack (15 mg twice a day, for 21 days.  Then take 20 mg daily until you follow-up with your primary care doctor/hematology.

## 2019-05-20 NOTE — ED Triage Notes (Signed)
Pt here following ultrasound that confirmed clot in left leg.

## 2019-05-20 NOTE — Discharge Instructions (Addendum)
Return in the morning for venous doppler test to see if you have a blood clot in your leg. If you do,  you will need to be on a blood thinner.

## 2019-05-20 NOTE — ED Notes (Signed)
Discharge instructions discussed with pt. Pt verbalized understanding. Pt stable and ambulatory. No signature pad available. 

## 2019-05-23 DIAGNOSIS — E119 Type 2 diabetes mellitus without complications: Secondary | ICD-10-CM | POA: Diagnosis not present

## 2019-05-23 DIAGNOSIS — I824Y2 Acute embolism and thrombosis of unspecified deep veins of left proximal lower extremity: Secondary | ICD-10-CM | POA: Diagnosis not present

## 2019-05-25 DIAGNOSIS — Z7189 Other specified counseling: Secondary | ICD-10-CM | POA: Diagnosis not present

## 2019-05-30 ENCOUNTER — Telehealth: Payer: Self-pay | Admitting: Oncology

## 2019-05-30 NOTE — Telephone Encounter (Signed)
New referral received from Carolee Rota, [facility]. Pt has been scheduled to see Dr. Alen Blew for PE on 10/27 at 11am. He's aware to arrive 15 minutes early.

## 2019-06-20 DIAGNOSIS — Z23 Encounter for immunization: Secondary | ICD-10-CM | POA: Diagnosis not present

## 2019-06-20 DIAGNOSIS — Z0000 Encounter for general adult medical examination without abnormal findings: Secondary | ICD-10-CM | POA: Diagnosis not present

## 2019-06-20 DIAGNOSIS — E1165 Type 2 diabetes mellitus with hyperglycemia: Secondary | ICD-10-CM | POA: Diagnosis not present

## 2019-06-21 ENCOUNTER — Inpatient Hospital Stay: Payer: Medicare Other | Attending: Oncology | Admitting: Oncology

## 2019-06-21 ENCOUNTER — Other Ambulatory Visit: Payer: Self-pay

## 2019-06-21 VITALS — BP 122/75 | HR 105 | Temp 98.9°F | Resp 18 | Ht 71.0 in | Wt 302.1 lb

## 2019-06-21 DIAGNOSIS — I82412 Acute embolism and thrombosis of left femoral vein: Secondary | ICD-10-CM | POA: Diagnosis not present

## 2019-06-21 DIAGNOSIS — Z7901 Long term (current) use of anticoagulants: Secondary | ICD-10-CM | POA: Diagnosis not present

## 2019-06-21 DIAGNOSIS — D369 Benign neoplasm, unspecified site: Secondary | ICD-10-CM

## 2019-06-21 NOTE — Progress Notes (Signed)
Reason for the request:    Deep vein thrombosis  HPI: I was asked by Carolee Rota, NP to evaluate Manuel Leonard for left deep vein thrombosis.  He is a 68 year old gentleman with multiple comorbid conditions including obesity and diabetes presented to the emergency department on 05/20/2019 with left leg swelling.  Ultrasound Doppler at that time showed findings consistent of age indeterminate deep vein thrombosis involving the left femoral vein and the left popliteal vein.  He was started on Xarelto which she has tolerated very well.  He reported decrease in the swelling of his leg as well as the pain or discoloration.  He has reported immobility and decrease in his level of exercise in the last few months.  He denies any recent surgery or travel.  He denies any previous history of deep vein thrombosis.   He does not report any headaches, blurry vision, syncope or seizures. Does not report any fevers, chills or sweats.  Does not report any cough, wheezing or hemoptysis.  Does not report any chest pain, palpitation, orthopnea or leg edema.  Does not report any nausea, vomiting or abdominal pain.  Does not report any constipation or diarrhea.  Does not report any skeletal complaints.    Does not report frequency, urgency or hematuria.  Does not report any skin rashes or lesions. Does not report any heat or cold intolerance.  Does not report any lymphadenopathy or petechiae.  Does not report any anxiety or depression.  Remaining review of systems is negative.    Past Medical History:  Diagnosis Date  . Abnormal EKG 12/03/2017  . Acute bronchitis 12/29/2016  . Adenomatous polyps   . Chest discomfort 12/03/2017  . COPD (chronic obstructive pulmonary disease) ([place])   . Diabetes mellitus ([place]) 12/03/2017  . Emphysema/COPD ([place])   . GERD (gastroesophageal reflux disease)   . Hyperlipidemia 12/21/2015  . Hypertension    not on meds   . Leukocytosis 09/27/2014  . Multiple pulmonary nodules   . Pneumonia    hx of 2017   . Sepsis ([place]) 09/27/2014  . Tobacco abuse   . Umbilical hernia [id number]  :  Past Surgical History:  Procedure Laterality Date  . APPENDECTOMY    . HEMORRHOIDECTOMY WITH HEMORRHOID BANDING    . HERNIA REPAIR     bilateral   . INSERTION OF MESH N/A 10/21/2016   Procedure: INSERTION OF MESH PATCH;  Surgeon: Armandina Gemma, MD;  Location: [facility];  Service: General;  Laterality: N/A;  . right index finger surgery - partial amputation     . TONSILLECTOMY    . UMBILICAL HERNIA REPAIR N/A 10/21/2016   Procedure: UMBILICAL HERNIA REPAIR;  Surgeon: Armandina Gemma, MD;  Location: [facility];  Service: General;  Laterality: N/A;  :   Current Outpatient Medications:  .  aspirin 81 MG EC tablet, Take 1 tablet (81 mg total) by mouth daily., Disp: 30 tablet, Rfl: 3 .  budesonide-formoterol (SYMBICORT) 160-4.5 MCG/ACT inhaler, Inhale 2 puffs into the lungs 2 (two) times daily., Disp: 1 Inhaler, Rfl: 3 .  ipratropium-albuterol (DUONEB) 0.5-2.5 (3) MG/3ML SOLN, Take 3 mLs by nebulization. As needed, Disp: , Rfl:  .  metFORMIN (GLUCOPHAGE) 500 MG tablet, Take 2 (two) times daily with a meal by mouth., Disp: , Rfl:  .  nitroGLYCERIN (NITROSTAT) 0.4 MG SL tablet, Place 0.4 mg under the tongue every 5 (five) minutes as needed for chest pain., Disp: , Rfl:  .  oxyCODONE-acetaminophen (PERCOCET) 5-325 MG tablet, Take 1 tablet by  mouth every 4 (four) hours as needed for moderate pain., Disp: 10 tablet, Rfl: 0 .  rivaroxaban (XARELTO) 20 MG TABS tablet, Take 1 tablet (20 mg total) by mouth daily with supper. To start after finishing starter pack, Disp: 30 tablet, Rfl: 0 .  Rivaroxaban 15 & 20 MG TBPK, Follow package directions: Take one 15mg  tablet by mouth twice a day. On day 22, switch to one 20mg  tablet once a day. Take with food., Disp: 51 each, Rfl: 0:  No Known Allergies:  Family History  Problem Relation Age of Onset  . Lung cancer Brother   . Stomach cancer Brother   . Cancer Father   :  Social  History   Socioeconomic History  . Marital status: Married    Spouse name: Not on file  . Number of children: Not on file  . Years of education: Not on file  . Highest education level: Not on file  Occupational History  . Occupation: Regulatory affairs officer  Social Needs  . Financial resource strain: Not on file  . Food insecurity    Worry: Not on file    Inability: Not on file  . Transportation needs    Medical: Not on file    Non-medical: Not on file  Tobacco Use  . Smoking status: Current Every Day Smoker    Packs/day: 1.00    Years: 48.00    Pack years: 48.00    Types: Cigarettes  . Smokeless tobacco: Never Used  Substance and Sexual Activity  . Alcohol use: Yes    Alcohol/week: 0.0 standard drinks    Comment: occasional glass of wine or beer   . Drug use: No  . Sexual activity: Not on file  Lifestyle  . Physical activity    Days per week: Not on file    Minutes per session: Not on file  . Stress: Not on file  Relationships  . Social Herbalist on phone: Not on file    Gets together: Not on file    Attends religious service: Not on file    Active member of club or organization: Not on file    Attends meetings of clubs or organizations: Not on file    Relationship status: Not on file  . Intimate partner violence    Fear of current or ex partner: Not on file    Emotionally abused: Not on file    Physically abused: Not on file    Forced sexual activity: Not on file  Other Topics Concern  . Not on file  Social History Narrative  . Not on file  :  Pertinent items are noted in HPI.  Exam: Blood pressure 122/75, pulse (!) 105, temperature 98.9 F (37.2 C), temperature source Oral, resp. rate 18, height 5\' 11"  (1.803 m), weight (!) 302 lb 1.6 oz (137 kg), SpO2 97 %.  ECOG 1  General appearance: alert and cooperative appeared without distress. Head: atraumatic without any abnormalities. Eyes: conjunctivae/corneas clear. PERRL.  Sclera anicteric. Throat:  lips, mucosa, and tongue normal; without oral thrush or ulcers. Resp: clear to auscultation bilaterally without rhonchi, wheezes or dullness to percussion. Cardio: regular rate and rhythm, S1, S2 normal, no murmur, click, rub or gallop GI: soft, non-tender; bowel sounds normal; no masses,  no organomegaly Skin: Skin color, texture, turgor normal. No rashes or lesions Lymph nodes: Cervical, supraclavicular, and axillary nodes normal. Neurologic: Grossly normal without any motor, sensory or deep tendon reflexes. Musculoskeletal: Left lower extremity showed some  mild mild swelling and erythema.  CBC    Component Value Date/Time   WBC 10.3 05/19/2019 1826   RBC 5.80 05/19/2019 1826   HGB 17.9 (H) 05/19/2019 1826   HCT 53.5 (H) 05/19/2019 1826   PLT 131 (L) 05/19/2019 1826   MCV 92.2 05/19/2019 1826   MCH 30.9 05/19/2019 1826   MCHC 33.5 05/19/2019 1826   RDW 13.7 05/19/2019 1826   LYMPHSABS 2.7 05/19/2019 1826   MONOABS 0.9 05/19/2019 1826   EOSABS 0.1 05/19/2019 1826   BASOSABS 0.1 05/19/2019 1826     Chemistry      Component Value Date/Time   NA 137 05/19/2019 1826   NA 145 (H) 12/07/2017 0914   K 5.2 (H) 05/19/2019 1826   CL 98 05/19/2019 1826   CO2 30 05/19/2019 1826   BUN 12 05/19/2019 1826   BUN 19 12/07/2017 0914   CREATININE 1.08 05/19/2019 1826      Component Value Date/Time   CALCIUM 9.4 05/19/2019 1826   ALKPHOS 89 05/19/2019 1826   AST 18 05/19/2019 1826   ALT 22 05/19/2019 1826   BILITOT 0.8 05/19/2019 1826       Assessment and Plan:   68 year old with:  1.  Left lower extremity deep vein thrombosis associated with femoral and popliteal vein thrombosis.  The age of the clot is undetermined but noted and September 2020.  He is currently on Xarelto and finishing the first month of therapy.   The natural course of inherited and acquired thrombophilia as well as provoking factor associated with this current deep vein thrombosis was reviewed.  His risk  factors including obesity and immobility.  I doubt there is an inherited thrombophilia at this time.  From a management standpoint, I have recommended full dose anticoagulation of a period of 3 to 6 months.  Longer anticoagulation will be required if his mobility lifestyle does not change.  He also understands longer anticoagulation may be needed if he developed recurrent thrombosis.  Occult malignancy is considered less likely as the source at this time.  He does not exhibit any signs symptoms to suggest malignancy.  He had a CT scan in May 2019 of the chest for different purposes.  Is also up-to-date on age-appropriate cancer screening.  For the time being I recommended completing 3 months of anticoagulation and consideration to extend anticoagulation if his lifestyle does not change.  2.  Follow-up: I am happy to see him in the future as needed.   40  minutes was spent with the patient face-to-face today.  More than 50% of time was dedicated to patient counseling, education and coordination of the patient's multifaceted care.    Thank you for the referral. A copy of this consult has been forwarded to the requesting physician.

## 2019-06-22 ENCOUNTER — Telehealth: Payer: Self-pay | Admitting: Oncology

## 2019-06-22 NOTE — Telephone Encounter (Signed)
No los per 10/27. °

## 2019-08-22 ENCOUNTER — Ambulatory Visit: Payer: Medicare Other | Attending: Internal Medicine

## 2019-08-22 DIAGNOSIS — U071 COVID-19: Secondary | ICD-10-CM

## 2019-08-24 LAB — NOVEL CORONAVIRUS, NAA: SARS-CoV-2, NAA: NOT DETECTED

## 2019-08-31 ENCOUNTER — Other Ambulatory Visit: Payer: Medicare Other

## 2019-09-02 ENCOUNTER — Ambulatory Visit: Payer: Medicare Other | Attending: Internal Medicine

## 2019-09-02 DIAGNOSIS — Z20822 Contact with and (suspected) exposure to covid-19: Secondary | ICD-10-CM

## 2019-09-03 LAB — NOVEL CORONAVIRUS, NAA: SARS-CoV-2, NAA: NOT DETECTED

## 2020-02-13 DIAGNOSIS — J441 Chronic obstructive pulmonary disease with (acute) exacerbation: Secondary | ICD-10-CM | POA: Diagnosis not present

## 2020-02-13 DIAGNOSIS — J189 Pneumonia, unspecified organism: Secondary | ICD-10-CM | POA: Diagnosis not present

## 2020-02-14 DIAGNOSIS — I444 Left anterior fascicular block: Secondary | ICD-10-CM | POA: Diagnosis not present

## 2020-02-14 DIAGNOSIS — R000 Tachycardia, unspecified: Secondary | ICD-10-CM | POA: Diagnosis not present

## 2020-02-16 DIAGNOSIS — J441 Chronic obstructive pulmonary disease with (acute) exacerbation: Secondary | ICD-10-CM | POA: Diagnosis not present

## 2020-02-16 DIAGNOSIS — J189 Pneumonia, unspecified organism: Secondary | ICD-10-CM | POA: Diagnosis not present

## 2020-06-27 DIAGNOSIS — H4089 Other specified glaucoma: Secondary | ICD-10-CM | POA: Insufficient documentation

## 2020-06-27 DIAGNOSIS — R079 Chest pain, unspecified: Secondary | ICD-10-CM | POA: Diagnosis not present

## 2020-06-27 DIAGNOSIS — K649 Unspecified hemorrhoids: Secondary | ICD-10-CM | POA: Diagnosis not present

## 2020-06-27 DIAGNOSIS — I739 Peripheral vascular disease, unspecified: Secondary | ICD-10-CM | POA: Diagnosis not present

## 2020-06-27 DIAGNOSIS — H109 Unspecified conjunctivitis: Secondary | ICD-10-CM | POA: Diagnosis not present

## 2020-06-27 DIAGNOSIS — Z794 Long term (current) use of insulin: Secondary | ICD-10-CM | POA: Diagnosis not present

## 2020-06-27 DIAGNOSIS — I159 Secondary hypertension, unspecified: Secondary | ICD-10-CM | POA: Diagnosis not present

## 2020-06-27 DIAGNOSIS — K5900 Constipation, unspecified: Secondary | ICD-10-CM | POA: Insufficient documentation

## 2020-06-27 DIAGNOSIS — Z7689 Persons encountering health services in other specified circumstances: Secondary | ICD-10-CM | POA: Diagnosis not present

## 2020-06-27 DIAGNOSIS — E119 Type 2 diabetes mellitus without complications: Secondary | ICD-10-CM | POA: Diagnosis not present

## 2020-06-27 DIAGNOSIS — Z86718 Personal history of other venous thrombosis and embolism: Secondary | ICD-10-CM | POA: Insufficient documentation

## 2020-06-27 DIAGNOSIS — J441 Chronic obstructive pulmonary disease with (acute) exacerbation: Secondary | ICD-10-CM | POA: Diagnosis not present

## 2020-06-27 DIAGNOSIS — E1139 Type 2 diabetes mellitus with other diabetic ophthalmic complication: Secondary | ICD-10-CM | POA: Diagnosis not present

## 2020-07-16 ENCOUNTER — Other Ambulatory Visit: Payer: Self-pay | Admitting: Physician Assistant

## 2020-07-16 DIAGNOSIS — K602 Anal fissure, unspecified: Secondary | ICD-10-CM | POA: Diagnosis not present

## 2020-07-16 DIAGNOSIS — R1310 Dysphagia, unspecified: Secondary | ICD-10-CM | POA: Diagnosis not present

## 2020-07-16 DIAGNOSIS — R1011 Right upper quadrant pain: Secondary | ICD-10-CM

## 2020-07-16 DIAGNOSIS — F17200 Nicotine dependence, unspecified, uncomplicated: Secondary | ICD-10-CM | POA: Diagnosis not present

## 2020-07-16 DIAGNOSIS — Z86010 Personal history of colonic polyps: Secondary | ICD-10-CM | POA: Diagnosis not present

## 2020-07-18 DIAGNOSIS — I739 Peripheral vascular disease, unspecified: Secondary | ICD-10-CM | POA: Diagnosis not present

## 2020-07-18 DIAGNOSIS — Z0000 Encounter for general adult medical examination without abnormal findings: Secondary | ICD-10-CM | POA: Diagnosis not present

## 2020-07-18 DIAGNOSIS — E785 Hyperlipidemia, unspecified: Secondary | ICD-10-CM | POA: Diagnosis not present

## 2020-07-18 DIAGNOSIS — I159 Secondary hypertension, unspecified: Secondary | ICD-10-CM | POA: Diagnosis not present

## 2020-07-18 DIAGNOSIS — J449 Chronic obstructive pulmonary disease, unspecified: Secondary | ICD-10-CM | POA: Diagnosis not present

## 2020-07-18 DIAGNOSIS — E119 Type 2 diabetes mellitus without complications: Secondary | ICD-10-CM | POA: Diagnosis not present

## 2020-07-25 DIAGNOSIS — Z1159 Encounter for screening for other viral diseases: Secondary | ICD-10-CM | POA: Diagnosis not present

## 2020-07-30 DIAGNOSIS — K31A15 Gastric intestinal metaplasia without dysplasia, involving multiple sites: Secondary | ICD-10-CM | POA: Diagnosis not present

## 2020-07-30 DIAGNOSIS — R1314 Dysphagia, pharyngoesophageal phase: Secondary | ICD-10-CM | POA: Diagnosis not present

## 2020-07-30 DIAGNOSIS — K2940 Chronic atrophic gastritis without bleeding: Secondary | ICD-10-CM | POA: Diagnosis not present

## 2020-07-30 DIAGNOSIS — Z800 Family history of malignant neoplasm of digestive organs: Secondary | ICD-10-CM | POA: Diagnosis not present

## 2020-07-30 DIAGNOSIS — K31A19 Gastric intestinal metaplasia without dysplasia, unspecified site: Secondary | ICD-10-CM | POA: Diagnosis not present

## 2020-08-01 ENCOUNTER — Ambulatory Visit
Admission: RE | Admit: 2020-08-01 | Discharge: 2020-08-01 | Disposition: A | Discharge status: Home / Self Care | Payer: Medicare Other | Source: Ambulatory Visit | Attending: Physician Assistant | Admitting: Physician Assistant

## 2020-08-01 DIAGNOSIS — N281 Cyst of kidney, acquired: Secondary | ICD-10-CM | POA: Diagnosis not present

## 2020-08-01 DIAGNOSIS — K760 Fatty (change of) liver, not elsewhere classified: Secondary | ICD-10-CM | POA: Diagnosis not present

## 2020-08-01 DIAGNOSIS — K8020 Calculus of gallbladder without cholecystitis without obstruction: Secondary | ICD-10-CM | POA: Diagnosis not present

## 2020-08-01 DIAGNOSIS — R1011 Right upper quadrant pain: Secondary | ICD-10-CM

## 2020-08-01 DIAGNOSIS — K7689 Other specified diseases of liver: Secondary | ICD-10-CM | POA: Diagnosis not present

## 2020-08-02 DIAGNOSIS — K2940 Chronic atrophic gastritis without bleeding: Secondary | ICD-10-CM | POA: Diagnosis not present

## 2020-08-02 DIAGNOSIS — K31A19 Gastric intestinal metaplasia without dysplasia, unspecified site: Secondary | ICD-10-CM | POA: Diagnosis not present

## 2020-08-07 ENCOUNTER — Other Ambulatory Visit: Payer: Self-pay

## 2020-08-07 DIAGNOSIS — I739 Peripheral vascular disease, unspecified: Secondary | ICD-10-CM

## 2020-08-20 DIAGNOSIS — Z23 Encounter for immunization: Secondary | ICD-10-CM | POA: Diagnosis not present

## 2020-08-21 ENCOUNTER — Encounter (HOSPITAL_COMMUNITY): Payer: Medicare Other

## 2020-08-21 ENCOUNTER — Encounter: Payer: Medicare Other | Admitting: Vascular Surgery

## 2020-09-05 DIAGNOSIS — Z20822 Contact with and (suspected) exposure to covid-19: Secondary | ICD-10-CM | POA: Diagnosis not present

## 2020-10-31 ENCOUNTER — Other Ambulatory Visit: Payer: Self-pay | Admitting: Vascular Surgery

## 2020-10-31 DIAGNOSIS — I739 Peripheral vascular disease, unspecified: Secondary | ICD-10-CM

## 2020-11-12 DIAGNOSIS — K8020 Calculus of gallbladder without cholecystitis without obstruction: Secondary | ICD-10-CM | POA: Diagnosis not present

## 2020-11-12 DIAGNOSIS — E119 Type 2 diabetes mellitus without complications: Secondary | ICD-10-CM | POA: Diagnosis not present

## 2020-11-19 DIAGNOSIS — K602 Anal fissure, unspecified: Secondary | ICD-10-CM | POA: Diagnosis not present

## 2020-11-19 DIAGNOSIS — J449 Chronic obstructive pulmonary disease, unspecified: Secondary | ICD-10-CM | POA: Diagnosis not present

## 2021-01-07 DIAGNOSIS — R000 Tachycardia, unspecified: Secondary | ICD-10-CM | POA: Diagnosis not present

## 2021-01-07 DIAGNOSIS — J441 Chronic obstructive pulmonary disease with (acute) exacerbation: Secondary | ICD-10-CM | POA: Diagnosis not present

## 2021-01-07 DIAGNOSIS — R079 Chest pain, unspecified: Secondary | ICD-10-CM | POA: Diagnosis not present

## 2021-06-30 ENCOUNTER — Emergency Department (HOSPITAL_COMMUNITY): Payer: Medicare Other

## 2021-06-30 ENCOUNTER — Encounter (HOSPITAL_COMMUNITY): Payer: Self-pay

## 2021-06-30 ENCOUNTER — Inpatient Hospital Stay (HOSPITAL_COMMUNITY)
Admission: EM | Admit: 2021-06-30 | Discharge: 2021-07-08 | DRG: 871 | Disposition: A | Discharge status: Home Health | Payer: Medicare Other | Attending: Family Medicine | Admitting: Family Medicine

## 2021-06-30 DIAGNOSIS — N179 Acute kidney failure, unspecified: Secondary | ICD-10-CM | POA: Diagnosis present

## 2021-06-30 DIAGNOSIS — J40 Bronchitis, not specified as acute or chronic: Secondary | ICD-10-CM | POA: Diagnosis not present

## 2021-06-30 DIAGNOSIS — R29818 Other symptoms and signs involving the nervous system: Secondary | ICD-10-CM | POA: Diagnosis not present

## 2021-06-30 DIAGNOSIS — D6959 Other secondary thrombocytopenia: Secondary | ICD-10-CM | POA: Diagnosis not present

## 2021-06-30 DIAGNOSIS — R000 Tachycardia, unspecified: Secondary | ICD-10-CM | POA: Diagnosis not present

## 2021-06-30 DIAGNOSIS — E785 Hyperlipidemia, unspecified: Secondary | ICD-10-CM | POA: Diagnosis present

## 2021-06-30 DIAGNOSIS — J9690 Respiratory failure, unspecified, unspecified whether with hypoxia or hypercapnia: Secondary | ICD-10-CM

## 2021-06-30 DIAGNOSIS — D751 Secondary polycythemia: Secondary | ICD-10-CM | POA: Diagnosis not present

## 2021-06-30 DIAGNOSIS — J1001 Influenza due to other identified influenza virus with the same other identified influenza virus pneumonia: Secondary | ICD-10-CM | POA: Diagnosis not present

## 2021-06-30 DIAGNOSIS — J440 Chronic obstructive pulmonary disease with acute lower respiratory infection: Secondary | ICD-10-CM

## 2021-06-30 DIAGNOSIS — I4891 Unspecified atrial fibrillation: Secondary | ICD-10-CM | POA: Diagnosis present

## 2021-06-30 DIAGNOSIS — G9341 Metabolic encephalopathy: Secondary | ICD-10-CM | POA: Diagnosis not present

## 2021-06-30 DIAGNOSIS — I771 Stricture of artery: Secondary | ICD-10-CM | POA: Diagnosis not present

## 2021-06-30 DIAGNOSIS — R0689 Other abnormalities of breathing: Secondary | ICD-10-CM | POA: Diagnosis not present

## 2021-06-30 DIAGNOSIS — R739 Hyperglycemia, unspecified: Secondary | ICD-10-CM | POA: Diagnosis not present

## 2021-06-30 DIAGNOSIS — Z800 Family history of malignant neoplasm of digestive organs: Secondary | ICD-10-CM

## 2021-06-30 DIAGNOSIS — T380X5A Adverse effect of glucocorticoids and synthetic analogues, initial encounter: Secondary | ICD-10-CM | POA: Diagnosis not present

## 2021-06-30 DIAGNOSIS — E8729 Other acidosis: Secondary | ICD-10-CM | POA: Diagnosis present

## 2021-06-30 DIAGNOSIS — K760 Fatty (change of) liver, not elsewhere classified: Secondary | ICD-10-CM | POA: Diagnosis present

## 2021-06-30 DIAGNOSIS — R109 Unspecified abdominal pain: Secondary | ICD-10-CM

## 2021-06-30 DIAGNOSIS — R404 Transient alteration of awareness: Secondary | ICD-10-CM | POA: Diagnosis not present

## 2021-06-30 DIAGNOSIS — I6522 Occlusion and stenosis of left carotid artery: Secondary | ICD-10-CM | POA: Diagnosis present

## 2021-06-30 DIAGNOSIS — K8020 Calculus of gallbladder without cholecystitis without obstruction: Secondary | ICD-10-CM | POA: Diagnosis present

## 2021-06-30 DIAGNOSIS — Z452 Encounter for adjustment and management of vascular access device: Secondary | ICD-10-CM | POA: Diagnosis not present

## 2021-06-30 DIAGNOSIS — J9601 Acute respiratory failure with hypoxia: Secondary | ICD-10-CM | POA: Diagnosis not present

## 2021-06-30 DIAGNOSIS — R4182 Altered mental status, unspecified: Secondary | ICD-10-CM | POA: Diagnosis not present

## 2021-06-30 DIAGNOSIS — I878 Other specified disorders of veins: Secondary | ICD-10-CM | POA: Diagnosis present

## 2021-06-30 DIAGNOSIS — J9602 Acute respiratory failure with hypercapnia: Secondary | ICD-10-CM | POA: Diagnosis not present

## 2021-06-30 DIAGNOSIS — F17210 Nicotine dependence, cigarettes, uncomplicated: Secondary | ICD-10-CM | POA: Diagnosis present

## 2021-06-30 DIAGNOSIS — R509 Fever, unspecified: Secondary | ICD-10-CM

## 2021-06-30 DIAGNOSIS — Z720 Tobacco use: Secondary | ICD-10-CM

## 2021-06-30 DIAGNOSIS — Z7982 Long term (current) use of aspirin: Secondary | ICD-10-CM

## 2021-06-30 DIAGNOSIS — I248 Other forms of acute ischemic heart disease: Secondary | ICD-10-CM | POA: Diagnosis not present

## 2021-06-30 DIAGNOSIS — Z7984 Long term (current) use of oral hypoglycemic drugs: Secondary | ICD-10-CM

## 2021-06-30 DIAGNOSIS — R0902 Hypoxemia: Secondary | ICD-10-CM | POA: Diagnosis not present

## 2021-06-30 DIAGNOSIS — Z7901 Long term (current) use of anticoagulants: Secondary | ICD-10-CM

## 2021-06-30 DIAGNOSIS — N281 Cyst of kidney, acquired: Secondary | ICD-10-CM | POA: Diagnosis not present

## 2021-06-30 DIAGNOSIS — J439 Emphysema, unspecified: Secondary | ICD-10-CM | POA: Diagnosis present

## 2021-06-30 DIAGNOSIS — J9620 Acute and chronic respiratory failure, unspecified whether with hypoxia or hypercapnia: Secondary | ICD-10-CM | POA: Diagnosis not present

## 2021-06-30 DIAGNOSIS — E1165 Type 2 diabetes mellitus with hyperglycemia: Secondary | ICD-10-CM | POA: Diagnosis not present

## 2021-06-30 DIAGNOSIS — R6521 Severe sepsis with septic shock: Secondary | ICD-10-CM | POA: Diagnosis not present

## 2021-06-30 DIAGNOSIS — R778 Other specified abnormalities of plasma proteins: Secondary | ICD-10-CM | POA: Diagnosis not present

## 2021-06-30 DIAGNOSIS — G4733 Obstructive sleep apnea (adult) (pediatric): Secondary | ICD-10-CM | POA: Diagnosis present

## 2021-06-30 DIAGNOSIS — E875 Hyperkalemia: Secondary | ICD-10-CM | POA: Diagnosis present

## 2021-06-30 DIAGNOSIS — Z7951 Long term (current) use of inhaled steroids: Secondary | ICD-10-CM

## 2021-06-30 DIAGNOSIS — I10 Essential (primary) hypertension: Secondary | ICD-10-CM | POA: Diagnosis present

## 2021-06-30 DIAGNOSIS — I672 Cerebral atherosclerosis: Secondary | ICD-10-CM | POA: Diagnosis present

## 2021-06-30 DIAGNOSIS — Z20822 Contact with and (suspected) exposure to covid-19: Secondary | ICD-10-CM | POA: Diagnosis not present

## 2021-06-30 DIAGNOSIS — R2990 Unspecified symptoms and signs involving the nervous system: Secondary | ICD-10-CM | POA: Diagnosis not present

## 2021-06-30 DIAGNOSIS — Z6841 Body Mass Index (BMI) 40.0 and over, adult: Secondary | ICD-10-CM

## 2021-06-30 DIAGNOSIS — G250 Essential tremor: Secondary | ICD-10-CM | POA: Diagnosis present

## 2021-06-30 DIAGNOSIS — Z86718 Personal history of other venous thrombosis and embolism: Secondary | ICD-10-CM

## 2021-06-30 DIAGNOSIS — R569 Unspecified convulsions: Secondary | ICD-10-CM | POA: Diagnosis not present

## 2021-06-30 DIAGNOSIS — R0602 Shortness of breath: Secondary | ICD-10-CM | POA: Diagnosis not present

## 2021-06-30 DIAGNOSIS — Z4682 Encounter for fitting and adjustment of non-vascular catheter: Secondary | ICD-10-CM | POA: Diagnosis not present

## 2021-06-30 DIAGNOSIS — Z801 Family history of malignant neoplasm of trachea, bronchus and lung: Secondary | ICD-10-CM

## 2021-06-30 DIAGNOSIS — I6523 Occlusion and stenosis of bilateral carotid arteries: Secondary | ICD-10-CM | POA: Diagnosis not present

## 2021-06-30 DIAGNOSIS — A4189 Other specified sepsis: Principal | ICD-10-CM | POA: Diagnosis present

## 2021-06-30 DIAGNOSIS — J101 Influenza due to other identified influenza virus with other respiratory manifestations: Secondary | ICD-10-CM | POA: Diagnosis not present

## 2021-06-30 DIAGNOSIS — Z79899 Other long term (current) drug therapy: Secondary | ICD-10-CM

## 2021-06-30 LAB — DIFFERENTIAL
Abs Immature Granulocytes: 0.08 K/uL — ABNORMAL HIGH (ref 0.00–0.07)
Basophils Absolute: 0.1 K/uL (ref 0.0–0.1)
Basophils Relative: 1 %
Eosinophils Absolute: 0.2 K/uL (ref 0.0–0.5)
Eosinophils Relative: 2 %
Immature Granulocytes: 1 %
Lymphocytes Relative: 11 %
Lymphs Abs: 1 K/uL (ref 0.7–4.0)
Monocytes Absolute: 0.5 K/uL (ref 0.1–1.0)
Monocytes Relative: 5 %
Neutro Abs: 7.9 K/uL — ABNORMAL HIGH (ref 1.7–7.7)
Neutrophils Relative %: 80 %

## 2021-06-30 LAB — COMPREHENSIVE METABOLIC PANEL
ALT: 50 U/L — ABNORMAL HIGH (ref 0–44)
AST: 67 U/L — ABNORMAL HIGH (ref 15–41)
Albumin: 3.8 g/dL (ref 3.5–5.0)
Alkaline Phosphatase: 79 U/L (ref 38–126)
Anion gap: 15 (ref 5–15)
BUN: 23 mg/dL (ref 8–23)
CO2: 23 mmol/L (ref 22–32)
Calcium: 8.5 mg/dL — ABNORMAL LOW (ref 8.9–10.3)
Chloride: 98 mmol/L (ref 98–111)
Creatinine, Ser: 1.67 mg/dL — ABNORMAL HIGH (ref 0.61–1.24)
GFR, Estimated: 44 mL/min — ABNORMAL LOW (ref >60)
Glucose, Bld: 207 mg/dL — ABNORMAL HIGH (ref 70–99)
Potassium: 5.5 mmol/L — ABNORMAL HIGH (ref 3.5–5.1)
Sodium: 136 mmol/L (ref 135–145)
Total Bilirubin: 1.7 mg/dL — ABNORMAL HIGH (ref 0.3–1.2)
Total Protein: 7.1 g/dL (ref 6.5–8.1)

## 2021-06-30 LAB — I-STAT ARTERIAL BLOOD GAS, ED
Acid-Base Excess: 2 mmol/L (ref 0.0–2.0)
Acid-Base Excess: 1 mmol/L (ref 0.0–2.0)
Bicarbonate: 31.9 mmol/L — ABNORMAL HIGH (ref 20.0–28.0)
Bicarbonate: 30.6 mmol/L — ABNORMAL HIGH (ref 20.0–28.0)
Calcium, Ion: 1.13 mmol/L — ABNORMAL LOW (ref 1.15–1.40)
Calcium, Ion: 1.11 mmol/L — ABNORMAL LOW (ref 1.15–1.40)
HCT: 54 % — ABNORMAL HIGH (ref 39.0–52.0)
HCT: 48 % (ref 39.0–52.0)
Hemoglobin: 18.4 g/dL — ABNORMAL HIGH (ref 13.0–17.0)
Hemoglobin: 16.3 g/dL (ref 13.0–17.0)
O2 Saturation: 100 %
O2 Saturation: 100 %
Patient temperature: 98.6
Patient temperature: 98.6
Potassium: 5.3 mmol/L — ABNORMAL HIGH (ref 3.5–5.1)
Potassium: 4.8 mmol/L (ref 3.5–5.1)
Sodium: 135 mmol/L (ref 135–145)
Sodium: 135 mmol/L (ref 135–145)
TCO2: 34 mmol/L — ABNORMAL HIGH (ref 22–32)
TCO2: 33 mmol/L — ABNORMAL HIGH (ref 22–32)
pCO2 arterial: 70.2 mmHg (ref 32.0–48.0)
pCO2 arterial: 71.6 mmHg (ref 32.0–48.0)
pH, Arterial: 7.265 — ABNORMAL LOW (ref 7.350–7.450)
pH, Arterial: 7.239 — ABNORMAL LOW (ref 7.350–7.450)
pO2, Arterial: 450 mmHg — ABNORMAL HIGH (ref 83.0–108.0)
pO2, Arterial: 297 mmHg — ABNORMAL HIGH (ref 83.0–108.0)

## 2021-06-30 LAB — LIPID PANEL
Cholesterol: 196 mg/dL (ref 0–200)
HDL: 39 mg/dL — ABNORMAL LOW (ref >40)
LDL Cholesterol: 137 mg/dL — ABNORMAL HIGH (ref 0–99)
Total CHOL/HDL Ratio: 5 RATIO
Triglycerides: 100 mg/dL (ref <150)
VLDL: 20 mg/dL (ref 0–40)

## 2021-06-30 LAB — I-STAT CHEM 8, ED
BUN: 32 mg/dL — ABNORMAL HIGH (ref 8–23)
Calcium, Ion: 0.81 mmol/L — CL (ref 1.15–1.40)
Chloride: 104 mmol/L (ref 98–111)
Creatinine, Ser: 1.6 mg/dL — ABNORMAL HIGH (ref 0.61–1.24)
Glucose, Bld: 209 mg/dL — ABNORMAL HIGH (ref 70–99)
HCT: 55 % — ABNORMAL HIGH (ref 39.0–52.0)
Hemoglobin: 18.7 g/dL — ABNORMAL HIGH (ref 13.0–17.0)
Potassium: 5.6 mmol/L — ABNORMAL HIGH (ref 3.5–5.1)
Sodium: 133 mmol/L — ABNORMAL LOW (ref 135–145)
TCO2: 24 mmol/L (ref 22–32)

## 2021-06-30 LAB — CBC
HCT: 56 % — ABNORMAL HIGH (ref 39.0–52.0)
Hemoglobin: 17.4 g/dL — ABNORMAL HIGH (ref 13.0–17.0)
MCH: 29.7 pg (ref 26.0–34.0)
MCHC: 31.1 g/dL (ref 30.0–36.0)
MCV: 95.7 fL (ref 80.0–100.0)
Platelets: 97 K/uL — ABNORMAL LOW (ref 150–400)
RBC: 5.85 MIL/uL — ABNORMAL HIGH (ref 4.22–5.81)
RDW: 13.8 % (ref 11.5–15.5)
WBC: 9.8 K/uL (ref 4.0–10.5)
nRBC: 0 % (ref 0.0–0.2)

## 2021-06-30 LAB — RESP PANEL BY RT-PCR (FLU A&B, COVID) ARPGX2
Influenza A by PCR: POSITIVE — AB
Influenza B by PCR: NEGATIVE
SARS Coronavirus 2 by RT PCR: NEGATIVE

## 2021-06-30 LAB — BASIC METABOLIC PANEL
Anion gap: 13 (ref 5–15)
BUN: 33 mg/dL — ABNORMAL HIGH (ref 8–23)
CO2: 24 mmol/L (ref 22–32)
Calcium: 8.4 mg/dL — ABNORMAL LOW (ref 8.9–10.3)
Chloride: 100 mmol/L (ref 98–111)
Creatinine, Ser: 2.26 mg/dL — ABNORMAL HIGH (ref 0.61–1.24)
GFR, Estimated: 31 mL/min — ABNORMAL LOW (ref >60)
Glucose, Bld: 268 mg/dL — ABNORMAL HIGH (ref 70–99)
Potassium: 5.4 mmol/L — ABNORMAL HIGH (ref 3.5–5.1)
Sodium: 137 mmol/L (ref 135–145)

## 2021-06-30 LAB — HEMOGLOBIN A1C
Hgb A1c MFr Bld: 6.7 % — ABNORMAL HIGH (ref 4.8–5.6)
Mean Plasma Glucose: 145.59 mg/dL

## 2021-06-30 LAB — BRAIN NATRIURETIC PEPTIDE: B Natriuretic Peptide: 212.9 pg/mL — ABNORMAL HIGH (ref 0.0–100.0)

## 2021-06-30 LAB — LACTIC ACID, PLASMA
Lactic Acid, Venous: 2.2 mmol/L (ref 0.5–1.9)
Lactic Acid, Venous: 2.6 mmol/L (ref 0.5–1.9)

## 2021-06-30 LAB — CBG MONITORING, ED
Glucose-Capillary: 209 mg/dL — ABNORMAL HIGH (ref 70–99)
Glucose-Capillary: 273 mg/dL — ABNORMAL HIGH (ref 70–99)

## 2021-06-30 LAB — PROTIME-INR
INR: 1.1 (ref 0.8–1.2)
Prothrombin Time: 14 s (ref 11.4–15.2)

## 2021-06-30 LAB — MRSA NEXT GEN BY PCR, NASAL: MRSA by PCR Next Gen: NOT DETECTED

## 2021-06-30 LAB — TROPONIN I (HIGH SENSITIVITY)
Troponin I (High Sensitivity): 198 ng/L (ref <18)
Troponin I (High Sensitivity): 461 ng/L (ref <18)

## 2021-06-30 LAB — APTT: aPTT: 26 seconds (ref 24–36)

## 2021-06-30 LAB — D-DIMER, QUANTITATIVE: D-Dimer, Quant: 2.39 ug{FEU}/mL — ABNORMAL HIGH (ref 0.00–0.50)

## 2021-06-30 LAB — HIV ANTIBODY (ROUTINE TESTING W REFLEX): HIV Screen 4th Generation wRfx: NONREACTIVE

## 2021-06-30 LAB — TSH: TSH: 0.934 u[IU]/mL (ref 0.350–4.500)

## 2021-06-30 MED ORDER — SODIUM CHLORIDE 0.9 % IV SOLN
2.0000 g | Freq: Once | INTRAVENOUS | Status: AC
  Administered 2021-06-30: 2 g via INTRAVENOUS
  Filled 2021-06-30: qty 2

## 2021-06-30 MED ORDER — IPRATROPIUM-ALBUTEROL 0.5-2.5 (3) MG/3ML IN SOLN
3.0000 mL | Freq: Once | RESPIRATORY_TRACT | Status: AC
  Administered 2021-06-30: 3 mL via RESPIRATORY_TRACT
  Filled 2021-06-30: qty 3

## 2021-06-30 MED ORDER — METHYLPREDNISOLONE SODIUM SUCC 125 MG IJ SOLR
125.0000 mg | Freq: Once | INTRAMUSCULAR | Status: DC
  Filled 2021-06-30: qty 2

## 2021-06-30 MED ORDER — SODIUM CHLORIDE 0.9 % IV BOLUS
1000.0000 mL | Freq: Once | INTRAVENOUS | Status: AC
  Administered 2021-06-30: 1000 mL via INTRAVENOUS

## 2021-06-30 MED ORDER — ACETAMINOPHEN 325 MG PO TABS: 650.0000 mg | ORAL_TABLET | Freq: Four times a day (QID) | ORAL | Status: DC | PRN

## 2021-06-30 MED ORDER — SODIUM CHLORIDE 0.9% FLUSH
3.0000 mL | Freq: Once | INTRAVENOUS | Status: AC
  Administered 2021-06-30: 3 mL via INTRAVENOUS

## 2021-06-30 MED ORDER — MAGNESIUM SULFATE 2 GM/50ML IV SOLN
2.0000 g | Freq: Once | INTRAVENOUS | Status: AC
  Administered 2021-06-30: 2 g via INTRAVENOUS
  Filled 2021-06-30: qty 50

## 2021-06-30 MED ORDER — UMECLIDINIUM BROMIDE 62.5 MCG/ACT IN AEPB
1.0000 | INHALATION_SPRAY | Freq: Every day | RESPIRATORY_TRACT | Status: DC
  Administered 2021-07-04 – 2021-07-08 (×5): 1 via RESPIRATORY_TRACT
  Filled 2021-06-30: qty 7

## 2021-06-30 MED ORDER — SODIUM CHLORIDE 0.9 % IV SOLN
500.0000 mg | INTRAVENOUS | Status: AC
  Administered 2021-06-30 – 2021-07-02 (×3): 500 mg via INTRAVENOUS
  Filled 2021-06-30 (×3): qty 500

## 2021-06-30 MED ORDER — OSELTAMIVIR PHOSPHATE 75 MG PO CAPS
75.0000 mg | ORAL_CAPSULE | Freq: Two times a day (BID) | ORAL | Status: DC
  Administered 2021-06-30: 75 mg via ORAL
  Filled 2021-06-30 (×2): qty 1

## 2021-06-30 MED ORDER — INSULIN ASPART 100 UNIT/ML IJ SOLN
0.0000 [IU] | Freq: Three times a day (TID) | INTRAMUSCULAR | Status: DC
  Administered 2021-07-01: 8 [IU] via SUBCUTANEOUS

## 2021-06-30 MED ORDER — ENOXAPARIN SODIUM 40 MG/0.4ML IJ SOSY: 40.0000 mg | PREFILLED_SYRINGE | INTRAMUSCULAR | Status: DC

## 2021-06-30 MED ORDER — ACETAMINOPHEN 650 MG RE SUPP: 650.0000 mg | Freq: Four times a day (QID) | RECTAL | Status: DC | PRN

## 2021-06-30 MED ORDER — SODIUM CHLORIDE 0.9 % IV SOLN: INTRAVENOUS | Status: AC

## 2021-06-30 MED ORDER — SODIUM CHLORIDE 0.9 % BOLUS PEDS: 1000.0000 mL | Freq: Once | INTRAVENOUS | Status: DC

## 2021-06-30 MED ORDER — IOHEXOL 350 MG/ML SOLN
125.0000 mL | Freq: Once | INTRAVENOUS | Status: AC | PRN
  Administered 2021-06-30: 125 mL via INTRAVENOUS

## 2021-06-30 MED ORDER — CALCIUM GLUCONATE-NACL 1-0.675 GM/50ML-% IV SOLN
1.0000 g | Freq: Once | INTRAVENOUS | Status: AC
  Administered 2021-07-01: 1000 mg via INTRAVENOUS
  Filled 2021-06-30: qty 50

## 2021-06-30 MED ORDER — ACETAMINOPHEN 650 MG RE SUPP
650.0000 mg | Freq: Once | RECTAL | Status: AC
  Administered 2021-06-30: 650 mg via RECTAL
  Filled 2021-06-30: qty 1

## 2021-06-30 MED ORDER — ENOXAPARIN SODIUM 80 MG/0.8ML IJ SOSY: 70.0000 mg | PREFILLED_SYRINGE | INTRAMUSCULAR | Status: DC

## 2021-06-30 MED ORDER — SODIUM ZIRCONIUM CYCLOSILICATE 10 G PO PACK: 10.0000 g | PACK | Freq: Three times a day (TID) | ORAL | Status: DC

## 2021-06-30 MED ORDER — SODIUM CHLORIDE 0.9 % IV SOLN: 2.0000 g | Freq: Two times a day (BID) | INTRAVENOUS | Status: DC

## 2021-06-30 MED ORDER — IPRATROPIUM-ALBUTEROL 0.5-2.5 (3) MG/3ML IN SOLN
3.0000 mL | RESPIRATORY_TRACT | Status: DC | PRN
  Administered 2021-07-01: 3 mL via RESPIRATORY_TRACT
  Filled 2021-06-30: qty 3

## 2021-06-30 MED ORDER — SODIUM CHLORIDE 0.9 % IV BOLUS
1000.0000 mL | Freq: Once | INTRAVENOUS | Status: AC
Start: 2021-06-30 — End: 2021-06-30
  Administered 2021-06-30: 1000 mL via INTRAVENOUS

## 2021-06-30 MED ORDER — ASPIRIN EC 81 MG PO TBEC
81.0000 mg | DELAYED_RELEASE_TABLET | Freq: Every day | ORAL | Status: DC
  Administered 2021-06-30: 81 mg via ORAL
  Filled 2021-06-30: qty 1

## 2021-06-30 NOTE — Progress Notes (Signed)
[place] cardiologist to discuss elevated troponin in patient x 2, (198 and 461). Cardilogist determined that we would need an Echocardiogram to determine the etiology of the troponin. He noted that if we see wall motion abnormalities we would need to call back to cardiology. If we do not see abnormalities on echo, the elevation in troponin is do to another cause, likely whatever is causing the rise in lactic acid (2.6, and 2.2).   -Holley Bouche, MD [facility] PGY-1

## 2021-06-30 NOTE — ED Notes (Signed)
Lab contacted regarding to be drawn labs

## 2021-06-30 NOTE — Hospital Course (Addendum)
Patient is a 70 year old male who presented after being found unresponsive due to acute respiratory failure with right-sided gaze.  PMH of COPD, DVT anticoagulated on Xarelto in 2020, HTN, HLD, T2DM, and tobacco use disorder-severe/dependent.  Acute hypoxic respiratory failure w/ Hypercapnia  COPD  Influenza A EMS was called, and patient was found to be unresponsive and hypoxic to 70s on RA. DuoNebs were given. Pt found to be hypoxic and hypercapnic in ED. Patient was initiated on BiPAP, and improvement in mentation noted. pH 7.265 and 7.239 on recheck pCO2 70.2 and 71.6 on recheck. WBC 9.8. Incidentally found to be Influenza A positive and tamiflu course was started and completed during hospitalization (11/6-11/10). CAP and COPD exacerbation coverage was provided with IV cefepime (11/6) and azithromycin (11/6-11/8) and ceftriaxone (11/7-11/11).  Blood and urine cultures showed no growth to date. Had intermittent chest pains with elevated troponins to 400s. CTPE negative. 11/06    Admitted by [facility] early evening. Patient's lab work continued to decline including worsening renal function, elevated lactic acid, and elevated troponin which prompted PCCM consultation. 11/07    0630 Admitted to ICU early AM, intubated after failed BiPAP trial            0425-0830 with precedex infusion Started levophed, propofol, insulin, fentanyl infusion 11/08  0030 Seizure-like episode with desaturation and bigeminy on EKG 0230 Intermittent arrhythmias  0300 Lethargic but responding to commands  0830-1200 EEG: diffuse encephalopathy w/o seizures or epileptiform changes Neuro believed tremulousness to be essential tremor  Stopped insulin infusion 11/09    Stop levophed, proprofol, fentanyl infusion 1030 Extubated  11/10    Lines removed, home meds restarted, start CPAP 11/11    0700 Out to floor with [facility] 11/12 Continued on duonebs prn and incruse daily. Procalcitonin 0.24, from 5.6. Ambulatory pulse oximetry  showed need for 2-4 L home O2.  Altered mental status  metabolic encephalopathy Patient went to bed with normal mentation and was unable to arouse morning of admission. Initial concern for stroke when patient arrived to ED via EMS due to flaccid right arm and right sided gaze. Code stroke was called and non the way to the hospital. Imaging for stroke (CT Head, CTA Head and neck) were negative. Code stroke was cancelled and tPA was not given. US abdomen for distended taut abdomen negative for acute process. HIV negative. TSH wnl. A1c 6.7. Blood smear demonstrated neutrophilia due to inflammatory infection, no blasts seen, large platelets present. Fibrinogen 558.   NSTEMI On admission, troponins 198>461. EKG without ST elevation. Felt to be demand ischemia due to septic shock. ECHO demonstrated EF 50-55% with low-normal LV function, mildly enlarged RV, and mild-to-moderate aortic valve sclerosis.   H/o DVT  No Atrial Fibrillation during admission on EKG or telemetry. History of DVT in September 2020 - L femoral and L popliteal veins; patient had not filled Xarelto since 2020 per pharmacy records and this was discontinued during this admission  Thrombocytopenia 132 on admission, monitored on CBCs. Platelets 124 on discharge.  AKI, resolved Baseline Cr around 1-1.4. On admission was 1.67 which increased up to 2.49 during ICU stay, and slowly returned to normal 0.97 at discharge.   Smoking Cessation Wellbutrin 150 mg daily x 3 days (started 11/10) was then increased to 150 mg BID on 11/13.  Chronic conditions of T2DM, HTN, HLD remained stable.   Discharge recommendations:  Going home on statin - has T2DM, high risk, previous hx DVT T2DM - A1c 6.7 on admission. PCP to follow up  and manage.  Smoking cessation very important! INCIDENTAL: [place] abd on 11/07 found cholelithiasis and hepatic steatosis. PCP to follow.  INCIDENTAL: [place] abd on 11/07 found simple right renal cysts the largest of which  measures 8.4 cm. PCP to follow.  F/u with pulm outpatient. Will need sleep study after discharge.  COPD started Spirva  Completed tamiflu course 11/11 and CTX IV/Cefdinir PO 11/13 NSTEMI: ASA and statin   Wellbutrin - smoking cessessation

## 2021-06-30 NOTE — ED Provider Notes (Signed)
[facility]ERGENCY DEPARTMENT Provider Note   CSN: [id number] Arrival date & time: 06/30/21  1503     History Chief Complaint  Patient presents with   Altered Mental Status   hypoxia    Manuel Leonard is a 70 y.o. male.  70 year old male with prior medical history as detailed below presents by EMS from home.  Patient's last known normal was last night at 10 PM.  Patient did not wake up this morning --family reports that he was very difficult to arouse.  EMS reports that he was hypoxic on room air with a room air sat in the 70's.  They initiated albuterol, Solu-Medrol, and O2.  Code stroke was called in the field.  Patient with apparent right-sided weakness and gaze deficit in addition to obtunded state.  Patient is [place] speaking at baseline.  Upon arrival ED neuro team at bedside.  Patient unable to provide history.  Level 5 caveat secondary to same.    Additional history obtained from family.  Patient with chronic COPD.  Patient with chronic cough.  Patient with low-grade temp recorded last night prior to going to bed.  Patient without prior history of respiratory arrest and or intubation per report.  The history is provided by the patient, the EMS personnel and a relative.  Altered Mental Status Presenting symptoms: unresponsiveness   Severity:  Severe Most recent episode:  Today Episode history:  Single Duration:  12 hours Timing:  Constant Progression:  Unchanged     Past Medical History:  Diagnosis Date   Abnormal EKG 12/03/2017   Acute bronchitis 12/29/2016   Adenomatous polyps    Chest discomfort 12/03/2017   COPD (chronic obstructive pulmonary disease) ([place])    Diabetes mellitus ([place]) 12/03/2017   Emphysema/COPD ([place])    GERD (gastroesophageal reflux disease)    Hyperlipidemia 12/21/2015   Hypertension    not on meds    Leukocytosis 09/27/2014   Multiple pulmonary nodules    Pneumonia    hx of 2017    Sepsis ([place]) 09/27/2014   Tobacco  abuse    Umbilical hernia 12/01/8117    Patient Active Problem List   Diagnosis Date Noted   Constipation 06/27/2020   History of DVT of lower extremity 06/27/2020   Other specified glaucoma 06/27/2020   Morbid obesity ([place]) 12/07/2017   Chest pain in adult 12/06/2017   Chest discomfort 12/03/2017   Abnormal EKG 12/03/2017   Hypertension 12/03/2017   Diabetes mellitus ([place]) 12/03/2017   GERD (gastroesophageal reflux disease)    Adenomatous polyps    Acute bronchitis 14/78/2956   Umbilical hernia 21/30/8657   Tobacco abuse 12/28/2015   Hyperlipidemia 12/21/2015   Multiple pulmonary nodules    COPD (chronic obstructive pulmonary disease) ([place]) 09/27/2014   Leukocytosis 09/27/2014   Sepsis ([place]) 09/27/2014   Dyslipidemia     Past Surgical History:  Procedure Laterality Date   APPENDECTOMY     HEMORRHOIDECTOMY WITH HEMORRHOID BANDING     HERNIA REPAIR     bilateral    INSERTION OF MESH N/A 10/21/2016   Procedure: INSERTION OF MESH PATCH;  Surgeon: Armandina Gemma, MD;  Location: [facility];  Service: General;  Laterality: N/A;   right index finger surgery - partial amputation      TONSILLECTOMY     UMBILICAL HERNIA REPAIR N/A 10/21/2016   Procedure: UMBILICAL HERNIA REPAIR;  Surgeon: Armandina Gemma, MD;  Location: [facility];  Service: General;  Laterality: N/A;       Family History  Problem Relation Age of Onset   Lung cancer Brother    Stomach cancer Brother    Cancer Father     Social History   Tobacco Use   Smoking status: Every Day    Packs/day: 1.00    Years: 48.00    Pack years: 48.00    Types: Cigarettes   Smokeless tobacco: Never  Vaping Use   Vaping Use: Never used  Substance Use Topics   Alcohol use: Yes    Alcohol/week: 0.0 standard drinks    Comment: occasional glass of wine or beer    Drug use: No    Home Medications Prior to Admission medications   Medication Sig Start Date End Date Taking? Authorizing Provider  aspirin 81 MG EC tablet Take 1 tablet  (81 mg total) by mouth daily. 01/22/18   Richardo Priest, MD  budesonide-formoterol (SYMBICORT) 160-4.5 MCG/ACT inhaler Inhale 2 puffs into the lungs 2 (two) times daily. 10/02/14   Robbie Lis, MD  ipratropium-albuterol (DUONEB) 0.5-2.5 (3) MG/3ML SOLN Take 3 mLs by nebulization. As needed    [provider]  metFORMIN (GLUCOPHAGE) 500 MG tablet Take 2 (two) times daily with a meal by mouth.    [provider]  nitroGLYCERIN (NITROSTAT) 0.4 MG SL tablet Place 0.4 mg under the tongue every 5 (five) minutes as needed for chest pain.    [provider]  oxyCODONE-acetaminophen (PERCOCET) 5-325 MG tablet Take 1 tablet by mouth every 4 (four) hours as needed for moderate pain. 2/95/28   Delora Fuel, MD  rivaroxaban (XARELTO) 20 MG TABS tablet Take 1 tablet (20 mg total) by mouth daily with supper. To start after finishing starter pack 06/19/19 07/19/19  Lennice Sites, DO  Rivaroxaban 15 & 20 MG TBPK Follow package directions: Take one 15mg  tablet by mouth twice a day. On day 22, switch to one 20mg  tablet once a day. Take with food. 05/20/19   Lennice Sites, DO    Allergies    Patient has no known allergies.  Review of Systems   Review of Systems  Unable to perform ROS: Mental status change   Physical Exam Updated Vital Signs BP 133/60   Pulse (!) 124   Resp (!) 36   Wt (!) 146 kg   SpO2 92%   BMI 44.89 kg/m   Physical Exam Vitals and nursing note reviewed.  Constitutional:      General: He is not in acute distress.    Appearance: He is well-developed.     Comments: Unresponsive, flaccid right arm  Nebulized treatment in progress    HENT:     Head: Normocephalic and atraumatic.  Eyes:     Conjunctiva/sclera: Conjunctivae normal.     Pupils: Pupils are equal, round, and reactive to light.  Cardiovascular:     Rate and Rhythm: Regular rhythm. Tachycardia present.     Heart sounds: Normal heart sounds.  Pulmonary:     Effort: Respiratory distress  present.     Comments: Decreased breath sounds at bases, poor air movement Abdominal:     General: There is no distension.     Palpations: Abdomen is soft.     Tenderness: There is no abdominal tenderness.  Musculoskeletal:        General: No deformity. Normal range of motion.     Cervical back: Neck supple.  Skin:    General: Skin is warm and dry.  Neurological:     Comments: Unresponsive  Withdraws to painful stimuli in all  4 extremities    ED Results / Procedures / Treatments   Labs (all labs ordered are listed, but only abnormal results are displayed) Labs Reviewed  CBC - Abnormal; Notable for the following components:      Result Value   RBC 5.85 (*)    Hemoglobin 17.4 (*)    HCT 56.0 (*)    Platelets 97 (*)    All other components within normal limits  DIFFERENTIAL - Abnormal; Notable for the following components:   Neutro Abs 7.9 (*)    Abs Immature Granulocytes 0.08 (*)    All other components within normal limits  I-STAT CHEM 8, ED - Abnormal; Notable for the following components:   Sodium 133 (*)    Potassium 5.6 (*)    BUN 32 (*)    Creatinine, Ser 1.60 (*)    Glucose, Bld 209 (*)    Calcium, Ion 0.81 (*)    Hemoglobin 18.7 (*)    HCT 55.0 (*)    All other components within normal limits  CBG MONITORING, ED - Abnormal; Notable for the following components:   Glucose-Capillary 209 (*)    All other components within normal limits  I-STAT ARTERIAL BLOOD GAS, ED - Abnormal; Notable for the following components:   pH, Arterial 7.265 (*)    pCO2 arterial 70.2 (*)    pO2, Arterial 450 (*)    Bicarbonate 31.9 (*)    TCO2 34 (*)    Potassium 5.3 (*)    Calcium, Ion 1.13 (*)    HCT 54.0 (*)    Hemoglobin 18.4 (*)    All other components within normal limits  RESP PANEL BY RT-PCR (FLU A&B, COVID) ARPGX2  MRSA NEXT GEN BY PCR, NASAL  COMPREHENSIVE METABOLIC PANEL  BRAIN NATRIURETIC PEPTIDE  D-DIMER, QUANTITATIVE (NOT AT [facility])  PROTIME-INR  APTT  I-STAT  ARTERIAL BLOOD GAS, ED  TROPONIN I (HIGH SENSITIVITY)    EKG EKG Interpretation  Date/Time:  Sunday June 30 2021 15:18:28 EST Ventricular Rate:  141 PR Interval:    QRS Duration: 95 QT Interval:  306 QTC Calculation: 469 R Axis:   -79 Text Interpretation: Atrial flutter with predominant 2:1 AV block Inferior infarct, old Extensive anterior infarct, old Confirmed by Dene Gentry [phone]) on 06/30/2021 3:51:56 PM  Radiology CT HEAD CODE STROKE WO CONTRAST  Result Date: 06/30/2021 CLINICAL DATA:  Code stroke.  Acute neuro deficit.  Unresponsive EXAM: CT HEAD WITHOUT CONTRAST TECHNIQUE: Contiguous axial images were obtained from the base of the skull through the vertex without intravenous contrast. COMPARISON:  None. FINDINGS: Brain: Moderate white matter hypodensity bilaterally. This is diffuse and likely chronic however no prior studies available for comparison. Ventricle size normal. Negative for acute cortical infarct, hemorrhage, mass. Vascular: Negative for hyperdense vessel Skull: Negative Sinuses/Orbits: Mucosal edema paranasal sinuses.  Negative orbit Other: None ASPECTS ([place] Stroke Program Early CT Score) - Ganglionic level infarction (caudate, lentiform nuclei, internal capsule, insula, M1-M3 cortex): 7 - Supraganglionic infarction (M4-M6 cortex): 3 Total score (0-10 with 10 being normal): 10 IMPRESSION: 1. No acute cortical infarct or hemorrhage. Moderate white matter changes most likely chronic microvascular ischemia. 2. ASPECTS is 10 3. Code stroke imaging results were communicated on 06/30/2021 at 3:18 pm to provider Rory Percy via text page Electronically Signed   By: Franchot Gallo M.D.   On: 06/30/2021 15:18    Procedures Procedures   Medications Ordered in ED Medications  magnesium sulfate IVPB 2 g 50 mL (2 g Intravenous New Bag/Given  06/30/21 1536)  ceFEPIme (MAXIPIME) 2 g in sodium chloride 0.9 % 100 mL IVPB (has no administration in time range)  sodium chloride flush  (NS) 0.9 % injection 3 mL (3 mLs Intravenous Given 06/30/21 1541)  ipratropium-albuterol (DUONEB) 0.5-2.5 (3) MG/3ML nebulizer solution 3 mL (3 mLs Nebulization Given by Other 06/30/21 1538)    ED Course  I have reviewed the triage vital signs and the nursing notes.  Pertinent labs & imaging results that were available during my care of the patient were reviewed by me and considered in my medical decision making (see chart for details).    MDM Rules/Calculators/A&P                           CRITICAL CARE Performed by: Valarie Merino   Total critical care time: 45 minutes  Critical care time was exclusive of separately billable procedures and treating other patients.  Critical care was necessary to treat or prevent imminent or life-threatening deterioration.  Critical care was time spent personally by me on the following activities: development of treatment plan with patient and/or surrogate as well as nursing, discussions with consultants, evaluation of patient's response to treatment, examination of patient, obtaining history from patient or surrogate, ordering and performing treatments and interventions, ordering and review of laboratory studies, ordering and review of radiographic studies, pulse oximetry and re-evaluation of patient's condition.    MDM  MSE complete  Lori Liew was evaluated in Emergency Department on 06/30/2021 for the symptoms described in the history of present illness. He was evaluated in the context of the global COVID-19 pandemic, which necessitated consideration that the patient might be at risk for infection with the SARS-CoV-2 virus that causes COVID-19. Institutional protocols and algorithms that pertain to the evaluation of patients at risk for COVID-19 are in a state of rapid change based on information released by regulatory bodies including the CDC and federal and state organizations. These policies and algorithms were followed during the  patient's care in the ED.  Patient presented from home via EMS.  Patient was noted to be unresponsive at home after going to bed normal around 10 PM last night.  Family reported low-grade temp observed last night prior to the patient going to bed. No other reported infectious symptoms.  Patient was noted to be hypoxic with a room air sat in the 70's at home. He is not on home O2.  Patient with longstanding history of COPD/Obesity/tobacco use.  Code stroke initiated in the field.  Neuro team does not feel the patient has evidence of CVA or needs acute neuro intervention.  Neuro team recommending MRI brain at some point with this admission.  BiPAP initiated shortly after arrival to ED.  Patient noted to be hypercarbic and mildly acidotic.  Mental status improved quickly with administration of BiPAP.  Broad-spectrum antibiotics given for possible pulmonary infection.  CT imaging of chest without clear evidence of PE.  Patient would benefit from admission.  Family medicine teaching team will evaluate.    Final Clinical Impression(s) / ED Diagnoses Final diagnoses:  Altered mental status, unspecified altered mental status type  Hypercarbia  Hypoxia  Fever, unspecified fever cause    Rx / DC Orders ED Discharge Orders     None        Valarie Merino, MD 06/30/21 1729

## 2021-06-30 NOTE — Progress Notes (Addendum)
RT called to patient room to place patient on bipap.  On arrival, patient was noted to be diaphoretic and had increased work of breathing.  STAT ABG obtained and results given to MD (Results as follows).  Gave patient one time DuoNeb due to expiratory wheezes noted.  Will obtain a repeat ABG in about 45 minutes.  Will continue to monitor.    Ref. Range 06/30/2021 15:28  Sample type Unknown ARTERIAL  pH, Arterial Latest Ref Range: 7.350 - 7.450  7.265 (L)  pCO2 arterial Latest Ref Range: 32.0 - 48.0 mmHg 70.2 (HH)  pO2, Arterial Latest Ref Range: 83.0 - 108.0 mmHg 450 (H)  TCO2 Latest Ref Range: 22 - 32 mmol/L 34 (H)  Acid-Base Excess Latest Ref Range: 0.0 - 2.0 mmol/L 2.0  Bicarbonate Latest Ref Range: 20.0 - 28.0 mmol/L 31.9 (H)  O2 Saturation Latest Units: % 100.0  Patient temperature Unknown 98.6 F  Collection site Unknown RADIAL, ALLEN'S TEST ACCEPTABLE

## 2021-06-30 NOTE — Code Documentation (Signed)
Stroke Response Nurse Documentation Code Documentation  Jarel Cuadra is a 70 y.o. male arriving to [facility] ED via [facility] on 06/30/21 with past medical hx of COPD, DM2, tobacco use, HLD, HTN, obesity, previous DVT. On Xarelto previously for DVT. Code stroke was activated by EMS.  Patient from home where he was LKW at 2200 on 06/29/21 and now complaining of unresponsive . The patient went to bed at 2200 and was in his normal state. His family tried to wake him up this afternoon and noticed that he was breathing harder than normal. They stated that they could not get a response from him. EMS called and he was found to have an oxygen saturation of 70%.   Stroke team at the bedside on patient arrival. Labs drawn and patient cleared for CT by Dr. Francia Greaves. Patient to CT with team. NIHSS 23, see documentation for details and code stroke times. Patient with decreased LOC and not following commands on exam. The following imaging was completed:  CT, CTA, CTP, and PE study (CT, CTA head and neck, CTP). Patient is not a candidate for IV Thrombolytic due to no acute stroke seen on imaging. Code stroke cancelled [place] per Dr. Malen Gauze   Bedside handoff with ED RN Judson Roch.    [place]  Rapid Response RN

## 2021-06-30 NOTE — ED Triage Notes (Signed)
CODE STROKE  From home BIB EMS. Pt went to bed at 2200 last night and didn't wake up. Right side gaze and unresponsive with EMS. 78% on RA; wheezing and diminished lung sounds. Improved with NEB and NRB. EMS gave: 15 albuterol, 1 atrovent, 125 solumedrol.   157/61 HR 134 CBG 270 20 L hand

## 2021-06-30 NOTE — Progress Notes (Signed)
Trending trops. First troponin elevated at 198.   FYI Late trop collection - 1st trop collected @1523 , 2nd trop not yet drawn as of 2134. Phlebotomy called x2 by Mds regarding this. Working on it now.   Ezequiel Essex, MD

## 2021-06-30 NOTE — Consult Note (Signed)
Neurology Consultation  Reason for Consult: Unresponsive with right gaze preference Referring Physician: Dr. Francia Greaves  CC: Unable to obtain chief complaint due to decreased responsiveness  History is obtained from: Chart review, EMS, Family at bedside, unable to obtain from patient due to decreased responsiveness  HPI: Manuel Leonard is a 70 y.o. male with a medical history significant for COPD, type 2 diabetes mellitus, hyperlipidemia, essential hypertension, obesity, tobacco use, and lower extremity DVT (2020) previously on Xarelto who presented to the ED 11/6 for evaluation of unresponsiveness with right-sided gaze. Per patient's family, patient went to bed last night in his usual state of health at 22:00. This morning patient's wife noted the patient was sleeping and did not disturb him. This afternoon, she noticed that his breathing seemed labored and she attempted to arouse him but he would not wake and subsequently activated EMS. On EMS arrival patient's SPO2 was 70% on room air with decreased lung sounds and patient was started on a nonrebreather and transported to [facility] as a code stroke for further evaluation of unresponsiveness and right-sided gaze.  LKW: 11/5 22:00 TNK given?: no, presentation is felt to be 2/2 hypoxia/hypercarbia with improvement in mental status with CPAP application.  CT head reviewed without evidence of acute ischemia. IR Thrombectomy? No, CT angiography imaging obtained without evidence of an LVO. Modified Rankin Scale: 0-Completely asymptomatic and back to baseline post- stroke  ROS: Unable to obtain due to altered mental status.   Past Medical History:  Diagnosis Date   Abnormal EKG 12/03/2017   Acute bronchitis 12/29/2016   Adenomatous polyps    Chest discomfort 12/03/2017   COPD (chronic obstructive pulmonary disease) (HCC)    Diabetes mellitus ([place]) 12/03/2017   Emphysema/COPD ([place])    GERD (gastroesophageal reflux disease)    Hyperlipidemia  12/21/2015   Hypertension    not on meds    Leukocytosis 09/27/2014   Multiple pulmonary nodules    Pneumonia    hx of 2017    Sepsis ([place]) 09/27/2014   Tobacco abuse    Umbilical hernia 3/41/9379   Past Surgical History:  Procedure Laterality Date   APPENDECTOMY     HEMORRHOIDECTOMY WITH HEMORRHOID BANDING     HERNIA REPAIR     bilateral    INSERTION OF MESH N/A 10/21/2016   Procedure: INSERTION OF MESH PATCH;  Surgeon: Armandina Gemma, MD;  Location: [facility]S;  Service: General;  Laterality: N/A;   right index finger surgery - partial amputation      TONSILLECTOMY     UMBILICAL HERNIA REPAIR N/A 10/21/2016   Procedure: UMBILICAL HERNIA REPAIR;  Surgeon: Armandina Gemma, MD;  Location: [facility];  Service: General;  Laterality: N/A;   Family History  Problem Relation Age of Onset   Lung cancer Brother    Stomach cancer Brother    Cancer Father    Social History:   reports that he has been smoking cigarettes. He has a 48.00 pack-year smoking history. He has never used smokeless tobacco. He reports current alcohol use. He reports that he does not use drugs.  Medications  Current Facility-Administered Medications:    ceFEPIme (MAXIPIME) 2 g in sodium chloride 0.9 % 100 mL IVPB, 2 g, Intravenous, Once, Messick, Peter C, MD   magnesium sulfate IVPB 2 g 50 mL, 2 g, Intravenous, Once, Valarie Merino, MD, Last Rate: 50 mL/hr at 06/30/21 1536, 2 g at 06/30/21 1536  Current Outpatient Medications:    aspirin 81 MG EC tablet, Take 1 tablet (  81 mg total) by mouth daily., Disp: 30 tablet, Rfl: 3   budesonide-formoterol (SYMBICORT) 160-4.5 MCG/ACT inhaler, Inhale 2 puffs into the lungs 2 (two) times daily., Disp: 1 Inhaler, Rfl: 3   ipratropium-albuterol (DUONEB) 0.5-2.5 (3) MG/3ML SOLN, Take 3 mLs by nebulization. As needed, Disp: , Rfl:    metFORMIN (GLUCOPHAGE) 500 MG tablet, Take 2 (two) times daily with a meal by mouth., Disp: , Rfl:    nitroGLYCERIN (NITROSTAT) 0.4 MG SL tablet, Place 0.4 mg under  the tongue every 5 (five) minutes as needed for chest pain., Disp: , Rfl:    oxyCODONE-acetaminophen (PERCOCET) 5-325 MG tablet, Take 1 tablet by mouth every 4 (four) hours as needed for moderate pain., Disp: 10 tablet, Rfl: 0   rivaroxaban (XARELTO) 20 MG TABS tablet, Take 1 tablet (20 mg total) by mouth daily with supper. To start after finishing starter pack, Disp: 30 tablet, Rfl: 0   Rivaroxaban 15 & 20 MG TBPK, Follow package directions: Take one 15mg  tablet by mouth twice a day. On day 22, switch to one 20mg  tablet once a day. Take with food., Disp: 51 each, Rfl: 0  Exam: Current vital signs: BP 133/60   Pulse (!) 124   Resp (!) 36   Wt (!) 146 kg   SpO2 92%   BMI 44.89 kg/m  Vital signs in last 24 hours: Pulse Rate:  [124-138] 124 (11/06 1615) Resp:  [23-36] 36 (11/06 1615) BP: (82-141)/(41-119) 133/60 (11/06 1615) SpO2:  [90 %-100 %] 92 % (11/06 1615) FiO2 (%):  [50 %] 50 % (11/06 1542) Weight:  [509 kg] 146 kg (11/06 1509)  GENERAL: Somnolent with decreased responsiveness with increased work of breathing on arrival. Psych: Unable to assess due to patient's mental status. Head: Normocephalic and atraumatic, without obvious abnormality EENT: Normal conjunctivae, dry mucous membranes, no OP obstruction LUNGS: Increased respiratory rate with use of accessory muscles for respirations.  Nonrebreather mask in place on arrival.  CPAP machine applied following CT head. CV: Tachycardia on cardiac monitor, no pedal edema noted ABDOMEN: Obese, rounded, nonrigid Extremities: warm, well perfused, without obvious deformity  NEURO:  Mental Status: Somnolent initially with improvement to lethargy following CPAP initiation. He is unable to find a clear coherent history of present illness. He initially does not open eyes but after initiation of CPAP, patient opens eyes to voice. Patient does not verbalize during assessment.  There may be some limitation due to language barrier as patient is  [place] speaking. Patient does not follow commands initially.  Patient follows very basic commands for family after completion of angiography imaging about 1 hour after arrival. Patient does not attempt to name objects or repeat phrases.  Cranial Nerves:  II: PERRL.  III, IV, VI: Initially patient with a right gaze preference without ability to cross midline towards the left.  Following neuroimaging, patient is able to attend to left and right visual fields without difficulty. V: Patient does not blink to threat throughout. VII: Face appears symmetric resting and with movement. VIII: Hearing is intact to voice IX, X: Unable to assess patient's palate elevation due to CPAP. XI: Head is grossly midline XII: Patient does not protrude tongue to plan. Motor: Left upper extremity has spontaneous and brief antigravity movement. Right upper extremity has less spontaneous movement on the left and initially has no antigravity movement.  Following all neuroimaging, patient is able to minimally elevate right upper extremity antigravity to command for patient's family at bedside. There is more spontaneous movement of  the left lower extremity than the right.  Patient wiggles bilateral toes to command.  There is no antigravity movement noted of bilateral lower extremities to command.  Patient withdrawals bilateral lower extremities to light application of noxious stimuli. Tone is normal. Bulk is normal.  Sensation: Patient withdraws briskly with slight application of noxious stimuli to all extremities without noted asymmetry. Coordination: Unable to assess due to patient's condition. DTRs: 2+ and symmetric throughout. Gait: Deferred for patient safety.  NIHSS: 1a Level of Conscious.: 2 1b LOC Questions:2  1c LOC Commands: 2 2 Best Gaze: 0 3 Visual: 1 4 Facial Palsy: 0 5a Motor Arm - left: 3 5b Motor Arm - Right: 2 6a Motor Leg - Left: 3 6b Motor Leg - Right: 3 7 Limb Ataxia: 0 8 Sensory: 0 9 Best  Language: 3 10 Dysarthria: 2 11 Extinct. and Inatten.: 0 TOTAL: 23  Labs I have reviewed labs in epic and the results pertinent to this consultation are: CBC    Component Value Date/Time   WBC 9.8 06/30/2021 1506   RBC 5.85 (H) 06/30/2021 1506   HGB 16.3 06/30/2021 1623   HCT 48.0 06/30/2021 1623   PLT 97 (L) 06/30/2021 1506   MCV 95.7 06/30/2021 1506   MCH 29.7 06/30/2021 1506   MCHC 31.1 06/30/2021 1506   RDW 13.8 06/30/2021 1506   LYMPHSABS 1.0 06/30/2021 1506   MONOABS 0.5 06/30/2021 1506   EOSABS 0.2 06/30/2021 1506   BASOSABS 0.1 06/30/2021 1506   CMP     Component Value Date/Time   NA 135 06/30/2021 1623   NA 145 (H) 12/07/2017 0914   K 4.8 06/30/2021 1623   CL 104 06/30/2021 1531   CO2 30 05/19/2019 1826   GLUCOSE 209 (H) 06/30/2021 1531   BUN 32 (H) 06/30/2021 1531   BUN 19 12/07/2017 0914   CREATININE 1.60 (H) 06/30/2021 1531   CALCIUM 9.4 05/19/2019 1826   PROT 7.1 05/19/2019 1826   ALBUMIN 3.9 05/19/2019 1826   AST 18 05/19/2019 1826   ALT 22 05/19/2019 1826   ALKPHOS 89 05/19/2019 1826   BILITOT 0.8 05/19/2019 1826   GFRNONAA >60 05/19/2019 1826   GFRAA >60 05/19/2019 1826   Lipid Panel  No results found for: CHOL, TRIG, HDL, CHOLHDL, VLDL, LDLCALC, LDLDIRECT No results found for: HGBA1C  Imaging I have reviewed the images obtained:  CT-scan of the brain 11/6: 1. No acute cortical infarct or hemorrhage. Moderate white matter changes most likely chronic microvascular ischemia. 2. ASPECTS is 10  CT angio head and neck with CT cerebral perfusion 11/6: 1. No large vessel occlusion. 2. 60% proximal left ICA stenosis. 3. Mild intracranial atherosclerosis without a flow limiting proximal stenosis. 4. Patent vertebral arteries with limited assessment proximally due to motion. 5. Negative CT perfusion. 6. Aortic Atherosclerosis (ICD10-I70.0).  DG chest portable 1 view 11/6: No active disease.  CT angio chest PE: Suboptimal opacification of  pulmonary arteries, however, no central pulmonary emboli identified. No active lung disease or other acute findings.  Assessment: 70 year old male who presented to the ED 11/6 for evaluation of unresponsiveness after wife noticed that he had increased work of breathing and was unarousable this afternoon.  On EMS arrival patient's oxygen saturation was 70% on room air with improvement on nonrebreather mask. -Examination reveals somnolent patient who does not follow commands, appears to spontaneously move left greater than right, with negative myoclonic movement intermittently of right upper extremity followed by left upper extremity.  His initial NIHSS is  23. - CT imaging reveals patient without acute cortical infarct or hemorrhage with an ASPECTS of 10.  Patient was then taken from radiology back to ED room for stabilization of respiratory status.  After initiation of CPAP, patient's examination improved and code stroke was canceled.  However on further evaluation, patient continued to exhibit some right greater than left weakness and was taken back to CT imaging for angiography and perfusion imaging. - CT angiography and perfusion imaging reviewed by attending neurologist without evidence of LVO. There is 60% proximal left ICA stenosis noted with mild intracranial atherosclerosis without flow-limiting proximal stenosis.  - Presentation is most concerning for hypoxia/hypercarbia due to improvement in patient's mental status with correction of hypoxia.  Initial ABG results are significant for pH of 7.265, PCO2 70.2, PO2 450, bicarb 31.9.    Impression: - Strokelike symptoms-likely secondary to hypoxic and hypercarbic respiratory failure  Recommendations: -Management of respiratory status and comorbid conditions per primary team. -Recommend evaluation for sleep apnea and need for CPAP at home to avoid further hypoxic events. -MRI brain when patient is stabilized from a respiratory standpoint and can  tolerate MRI imaging.  Anibal Henderson, AGAC-NP [facility] Pager: [phone]   Attending addendum Patient seen and examined as an acute code stroke for unresponsiveness with right gaze preference   Agree with the history above: Went to bed last night and did not wake up, EMS was called who noted him to be obtunded with right-sided gaze preference. Was on Xarelto for DVT in 2020 which was discontinued according to the family-family are unreliable historians  On initial evaluation, he did not have a gaze preference but was not following any commands, appeared severely obtunded, pupils are equal round reactive to light, no facial asymmetry, was on a nonrebreather, diminished lung sounds all over, to noxious stimulation, moved left upper and lower extremities stronger than right.  Had what appeared to be asterixis in both right and left upper extremities.  Was taken for noncontrast head CT which I personally reviewed-no acute changes Brought back to the room and hooked up to BiPAP.  Arterial blood gas showed a pH of 7.265, PCO2 of 70.2, bicarb 31.9, PO2 450. He started to show some improvement as his oxygenation improved. When his family arrived, and spoke to him and his mother tongue-Italian, he was able to follow simple commands.  I do not suspect that he has had a stroke but given the focality I did a CT angiogram head and neck and CT perfusion study-negative for large vessel occlusion or perfusion deficit.  His symptoms are most likely secondary to hypercarbia/respiratory failure. Given the risk factors, I would recommend getting an MRI of the brain without contrast when he is stable to rule out a stroke. If that MRI is positive for stroke, will need full stroke work-up.  Otherwise no further neurological work-up needed at this time. Neurology will follow imaging.  If any further assistance is required from neurology, please call back the on-call neurologist.  He will  need outpatient follow-up with a sleep study and possible CPAP for sleep apnea   Plan was discussed in detail with Dr. Francia Greaves in the ER and the patient's family-daughter, wife and brother who are present at bedside.   -- Amie Portland, MD Neurologist [facility] Pager: [phone]   CRITICAL CARE ATTESTATION Performed by: Amie Portland, MD Total critical care time: 60 minutes Critical care time was exclusive of separately billable procedures and treating other patients and/or supervising APPs/Residents/Students Critical care  was necessary to treat or prevent imminent or life-threatening deterioration due to strokelike symptoms, hypoxic and hypercarbic respiratory failure This patient is critically ill and at significant risk for neurological worsening and/or death and care requires constant monitoring. Critical care was time spent personally by me on the following activities: development of treatment plan with patient and/or surrogate as well as nursing, discussions with consultants, evaluation of patient's response to treatment, examination of patient, obtaining history from patient or surrogate, ordering and performing treatments and interventions, ordering and review of laboratory studies, ordering and review of radiographic studies, pulse oximetry, re-evaluation of patient's condition, participation in multidisciplinary rounds and medical decision making of high complexity in the care of this patient.

## 2021-06-30 NOTE — Progress Notes (Signed)
Follow up ABG obtained and results given to MD.  No further changes at this time per MD.  Will continue to monitor patient.    Ref. Range 06/30/2021 16:23  Sample type Unknown ARTERIAL  pH, Arterial Latest Ref Range: 7.350 - 7.450  7.239 (L)  pCO2 arterial Latest Ref Range: 32.0 - 48.0 mmHg 71.6 (HH)  pO2, Arterial Latest Ref Range: 83.0 - 108.0 mmHg 297 (H)  TCO2 Latest Ref Range: 22 - 32 mmol/L 33 (H)  Acid-Base Excess Latest Ref Range: 0.0 - 2.0 mmol/L 1.0  Bicarbonate Latest Ref Range: 20.0 - 28.0 mmol/L 30.6 (H)  O2 Saturation Latest Units: % 100.0  Patient temperature Unknown 98.6 F  Collection site Unknown RADIAL, ALLEN'S TEST ACCEPTABLE

## 2021-06-30 NOTE — Progress Notes (Signed)
Patient transported to CT and back to ED room 26 without complications, while on bipap.

## 2021-06-30 NOTE — Progress Notes (Signed)
FPTS Interim Progress Note  BP 124/86   Pulse (!) 106   Temp 98.1 F (36.7 C) (Axillary)   Resp (!) 30   Ht 5\' 11"  (1.803 m)   Wt (!) 146 kg   SpO2 99%   BMI 44.89 kg/m    Patient's lactic acid rising despite 1L NS bolus. K+ rising to 5.4. Cr drastically increased from 1.60 to 2.26 over the course of approx 4 hours. Troponins moderately elevated and rising from 198 to 461.   - additional 1L bolus NS - continue NS maintenance fluids - lokelma 10 mg TID x 24 hours - calcium gluconate IVPB x1 - consult cardiology - trend troponins until flat or down-trending - repeat EKG - ECHO ordered on admission, will likely get done in AM - continue BiPAP overnight - consider pulm CCM consult   Ezequiel Essex, MD 06/30/2021, 11:07 PM PGY-2, [place] [facility] pager [phone]

## 2021-06-30 NOTE — H&P (Addendum)
Manuel Leonard: [phone]  Patient name: Manuel Leonard Medical record number: [id number] Date of birth: 08/27/1950 Age: 70 y.o. Gender: male  Primary Care Provider: Ward, Manuel Shadow, FNP Consultants: Neurology (called off code stroke), pulmonology Code Status: FULL, which was confirmed with family Preferred Emergency Contact: Manuel Leonard (English-speaking), daughter: [phone]; Manuel Leonard, daughter: [phone]; or Manuel Leonard, wife [phone]  Chief Complaint: non-responsive this AM  Assessment and Plan: Manuel Leonard is a 70 y.o. male presenting with obtundation and hypoxia. Patient found to meet SIRS criteria. PMH is significant for COPD/emphysema, T2DM, prior DVT, and tobacco use disorder, morbid obesity, hyperlipidemia, history of hypertension  Altered Mental Status 2/2 Acute hypoxic respiratory failure w/ Hypercapnia   Patient went to bed with normal mentation and was unable to arouse this morning. EMS was called, and patient was found to be unresponsive and hypoxic to 70s on RA. DuoNebs were given, and code stroke was called on the way to the hospital. Imaging for stroke (CT Head, CTA Head and neck) were negative. Code stroke was cancelled and tPA was not given. Sx likely not neurological in nature. Pt found to be hypoxic and hypercapnic. Patient was initiated on BiPAP, and improvement in mentation noted.Prior admission for COPD exacerbation in 2016. pH 7.265 and 7.239 on recheck pCO2 70.2 and 71.6 on recheck. WBC 9.8. Incidentally found to be Influenza A positive. DDx: COPD exacerbation vs. Sepsis due to influenza vs. Sleep apnea vs stroke. CVA less likely as not shown on prior imaging, but will continue to work up. Mentation is improved. Pt is alert and able to respond to family with short phrases in [place].  -Initiate Tamiflu 75 mg BID for Influenza A positive result -Initiate Incruse Ellipta 1 puff  daily - Azithromycin 500 mg IV q24 - s/p Cefepime 2g q12 - DuoNeb 3 mL q2h PRN d -Continue BiPAP through the night. -Ordered Legionella pneumophilia Serogp 1 Ur Ag. - Follow up MRSA PCR - MR Brain pending - MRSA pending -Blood cx pending -Will obtain repeat CBC,  -Obtain TSH, HIV  Influenza A  Meets Sepsis Criteria   Incidentally on admission.  Treat with Tamiflu. Lactic acid 2.2.  -Tamiflu 75 mg twice daily -Follow up Blood and urine cultures  -Give 1L NS bolus  -Repeat lactic acid   Hyperkalemia K+ 5.6.  Evidence of peaked T waves on a EKG. repeat 4.8 -Repeat BMP this evening -Will monitor CMP on AM labs.  -Consider Lokelma if not improved.   AKI Cr 1.67, Baseline range 1.0-1.14 -Initiate maintenance fluids at 150 ml/hr x 15 hours.  -Will monitor CMP on AM labs.  -1 L bolus -Avoid nephrotoxic agents  Elevated Troponin hsTroponin 198 in the setting of tachycardia; likely demand ischemia.  No echo available for review.  BNP 213. -Will repeat Troponin and EKG. -Aspirin EC 81 mg given -Follow-up echocardiogram  H/o DVT  D-dimer 2.39; with negative CTA, not likely to be PE. Likely 2/2 tachycardia. However, venous stasis present in BLE with tenderness in LLE > RLE.  Has left calf pain.  On chart review, patient previously prescribed Xarelto in September 2020 for left femoral vein and left popliteal vein DVT.  Family deny anticoagulation use.  Per oncology note in October 2020, patient was to take Xarelto for 3 to 6 months.  Oncology did not believe his thrombophilia to be inherited.  -Continue to monitor for worsening of sx. -Holding AC for now, but Lovenox for prophylaxis.   COPD with ?  acute exacerbation Emphysema Family report patient has been using Symbicort frequently.  Had increased cough.  Unsure of increased sputum production.  Patient using Symbicort.  He was hospitalized in 2016 for COPD exacerbation and CAP.  -Initiate Incruse tomorrow  -DuoNebs Q2H PRN  -  continue BiPAP  - Azithromycin  - consider steroids    Elevated Transaminases ALT 50, AST 67, T bili 1.7.  Per family, patient drinks occasionally.  -Monitor with CMP -Consider hepatitis panel and right upper quadrant ultrasound   T2DM CBG 209, A1c 6.7% on admission.  Takes metformin 500 mg twice daily. -Will continue to monitor with mealtime CBGs. -Hold home metformin, given recent contrast use -Moderate sliding scale insulin   Polycythemia Hgb 17.4. Likely 2/2 to tobacco use.  -monitor with daily CBC   Thrombocytopenia  Plt 97 on admission.  Platelets 140s-130s for the past 2 years. -Will continue to monitor with daily CBC -Blood smear    Tobacco use disorder Patient current 1 ppd smoker. Has smoked for about ~50 years.  -Consider nicotine transdermal patch 21 mg q 24h   Dyslipidemia Lipid panel this admission; TGs 100, total cholesterol 196, LDL 137, HDL 39. Previously on Zetia, Simvastatin and ASA daily.  -Will continue to monitor.  -Start statin when able    FEN/GI: NPO Prophylaxis: Lovenox (wt based)  Disposition: Admit to progressive   History of Present Illness:  Manuel Leonard is a 70 y.o. male presenting with obtundation, hypoxia, and concern for stroke. Patient has difficulty breathing and understanding English, so interview is with wife and daughters, with entirety translated by daughter, Manuel Leonard. She says that this morning, patient was breathing heavily, unresponsive to name being called, and ruddy in color, so EMS was called. This is the first time this has happened. Patient snores, and more recently has had a worsening cough leading to choking and apneic episodes while sleeping. Family only endorses patient having history of T2DM and COPD but denies cardiac history. Denies history of DVT or use of anticoagulation.   HPI limited by acuity of situation while pt wearing BiPAP.    Review Of Systems: Per HPI with the following additions:   Review of  Systems  Unable to perform ROS: Acuity of condition  Constitutional:  Positive for chills, diaphoresis and fatigue.  Respiratory:  Positive for cough and shortness of breath.   Gastrointestinal:  Positive for abdominal pain. Negative for diarrhea and vomiting.       Episode of fecal incontinence during EMS transport  Skin:  Positive for color change.  Neurological:        Somnolence   ROS provided by family   Patient Active Problem List   Diagnosis Date Noted   Constipation 06/27/2020   History of DVT of lower extremity 06/27/2020   Other specified glaucoma 06/27/2020   Morbid obesity ([place]) 12/07/2017   Chest pain in adult 12/06/2017   Chest discomfort 12/03/2017   Abnormal EKG 12/03/2017   Hypertension 12/03/2017   Diabetes mellitus ([place]) 12/03/2017   GERD (gastroesophageal reflux disease)    Adenomatous polyps    Acute bronchitis 72/62/0355   Umbilical hernia 97/41/6384   Tobacco abuse 12/28/2015   Hyperlipidemia 12/21/2015   Multiple pulmonary nodules    COPD (chronic obstructive pulmonary disease) ([place]) 09/27/2014   Leukocytosis 09/27/2014   Sepsis ([place]) 09/27/2014   Dyslipidemia     Past Medical History: Past Medical History:  Diagnosis Date   Abnormal EKG 12/03/2017   Acute bronchitis 12/29/2016   Adenomatous polyps  Chest discomfort 12/03/2017   COPD (chronic obstructive pulmonary disease) ([facility])    Diabetes mellitus ([place]) 12/03/2017   Emphysema/COPD ([place])    GERD (gastroesophageal reflux disease)    Hyperlipidemia 12/21/2015   Hypertension    not on meds    Leukocytosis 09/27/2014   Multiple pulmonary nodules    Pneumonia    hx of 2017    Sepsis ([place]) 09/27/2014   Tobacco abuse    Umbilical hernia 9/51/8841    Past Surgical History: Past Surgical History:  Procedure Laterality Date   APPENDECTOMY     HEMORRHOIDECTOMY WITH HEMORRHOID BANDING     HERNIA REPAIR     bilateral    INSERTION OF MESH N/A 10/21/2016   Procedure: INSERTION OF MESH PATCH;   Surgeon: Armandina Gemma, MD;  Location: [facility];  Service: General;  Laterality: N/A;   right index finger surgery - partial amputation      TONSILLECTOMY     UMBILICAL HERNIA REPAIR N/A 10/21/2016   Procedure: UMBILICAL HERNIA REPAIR;  Surgeon: Armandina Gemma, MD;  Location: [facility];  Service: General;  Laterality: N/A;    Social History: Social History   Tobacco Use   Smoking status: Every Day    Packs/day: 1.00    Years: 48.00    Pack years: 48.00    Types: Cigarettes   Smokeless tobacco: Never  Vaping Use   Vaping Use: Never used  Substance Use Topics   Alcohol use: Yes    Alcohol/week: 0.0 standard drinks    Comment: occasional glass of wine or beer    Drug use: No   Additional social history:   Please also refer to relevant sections of EMR.  Family History: Family History  Problem Relation Age of Onset   Lung cancer Brother    Stomach cancer Brother    Cancer Father   Colon cancer- brother  Allergies and Medications: No Known Allergies No current facility-administered medications on file prior to encounter.   Current Outpatient Medications on File Prior to Encounter  Medication Sig Dispense Refill   aspirin 81 MG EC tablet Take 1 tablet (81 mg total) by mouth daily. 30 tablet 3   budesonide-formoterol (SYMBICORT) 160-4.5 MCG/ACT inhaler Inhale 2 puffs into the lungs 2 (two) times daily. 1 Inhaler 3   ipratropium-albuterol (DUONEB) 0.5-2.5 (3) MG/3ML SOLN Take 3 mLs by nebulization. As needed     metFORMIN (GLUCOPHAGE) 500 MG tablet Take 2 (two) times daily with a meal by mouth.     nitroGLYCERIN (NITROSTAT) 0.4 MG SL tablet Place 0.4 mg under the tongue every 5 (five) minutes as needed for chest pain.     oxyCODONE-acetaminophen (PERCOCET) 5-325 MG tablet Take 1 tablet by mouth every 4 (four) hours as needed for moderate pain. 10 tablet 0   rivaroxaban (XARELTO) 20 MG TABS tablet Take 1 tablet (20 mg total) by mouth daily with supper. To start after finishing starter pack  30 tablet 0   Rivaroxaban 15 & 20 MG TBPK Follow package directions: Take one 15mg  tablet by mouth twice a day. On day 22, switch to one 20mg  tablet once a day. Take with food. 51 each 0    Objective: BP 123/78   Pulse (!) 124   Temp (!) 101.7 F (38.7 C) (Axillary)   Resp (!) 33   Wt (!) 146 kg   SpO2 95%   BMI 44.89 kg/m  Exam: General: Obese, age-congruent appearing male, in acute distress. BiPAP in place; patient disphoretic. Eyes: eyes closed,  only briefly opens in conversation. ENTM: BiPAP in place Neck: Diaphoretic  Cardiovascular: Heart sounds difficult to auscultate 2/2 sound of BiPAP. Proximal R radial pulse 3+ (L wrist wrapped in bandage); distal pulses palpable, 3+ bilaterally. Respiratory: Increased work of breathing, on BiPAP  Gastrointestinal: distention/rounded abdomen, non-tender to palpation, no appreciable fluid wave MSK: Venous stasis present in BLE, painful L>R. Extremities cool to touch. Previous amputation of R index finger. Derm: bruising of BLE, c/w venous stasis  Neuro: UTA, as patient only nods and points to answer Psych: UTA, as patient only nods and points to answer  Labs and Imaging: CBC BMET  Recent Labs  Lab 06/30/21 1506 06/30/21 1528 06/30/21 1623  WBC 9.8  --   --   HGB 17.4*   < > 16.3  HCT 56.0*   < > 48.0  PLT 97*  --   --    < > = values in this interval not displayed.   Recent Labs  Lab 06/30/21 1506 06/30/21 1528 06/30/21 1531 06/30/21 1623  NA 136   < > 133* 135  K 5.5*   < > 5.6* 4.8  CL 98  --  104  --   CO2 23  --   --   --   BUN 23  --  32*  --   CREATININE 1.67*  --  1.60*  --   GLUCOSE 207*  --  209*  --   CALCIUM 8.5*  --   --   --    < > = values in this interval not displayed.     EKG: My own interpretation: Sinus tachycardia with PVCs, peaked T-waves, no ST elevations  CT Head: No acute infarct nor hemorrhage, chronic microvascular ischemia in white matter.  CXR: WNL; no active cardiopulmonary dz.  CTA  Head and Neck: No occlusion of large vessels, but 60% stenosis of proximal L ICA. Mild intracranial atherosclerosis. Patent vertebral arteries.   CTA Chest PE: Some opacification of pulmonary arteries, but no pulmonary emboli. No acute lung dz.   Rosezetta Schlatter, MD 06/30/2021, 5:27 PM PGY-1, [place] Upper-Level Resident Addendum   I have independently interviewed and examined the patient. I have discussed the above with the original author and agree with their documentation. My edits for correction/addition/clarification are in within the document. Please see also any attending notes.   Lyndee Hensen, DO PGY-3, [facility] 06/30/2021 8:05 PM  FPTS Service Leonard: [phone] (text pages welcome through [facility])

## 2021-06-30 NOTE — Progress Notes (Signed)
Pharmacy Antibiotic Note  Manuel Leonard is a 70 y.o. male admitted on 06/30/2021 with sepsis.  Pharmacy has been consulted for cefepime dosing.  SCr 1.6  WBC 9.8  Plan: Cefepime 2g q12h Trend WBC, Fever, Renal function, & Clinical course F/u cultures Levels at steady state De-escalate when able  Weight: (!) 146 kg (321 lb 14 oz)  No data recorded.  Recent Labs  Lab 06/30/21 1531  CREATININE 1.60*    CrCl cannot be calculated (Unknown ideal weight.).    No Known Allergies  Antimicrobials this admission: cefepime 11/6 >>   Microbiology results: Pending  Thank you for allowing pharmacy to be a part of this patient's care.  Lorelei Pont, PharmD, BCPS 06/30/2021 3:35 PM ED Clinical Pharmacist -  [phone]

## 2021-07-01 ENCOUNTER — Inpatient Hospital Stay (HOSPITAL_COMMUNITY): Payer: Medicare Other | Admitting: Anesthesiology

## 2021-07-01 ENCOUNTER — Inpatient Hospital Stay (HOSPITAL_COMMUNITY): Payer: Medicare Other

## 2021-07-01 ENCOUNTER — Other Ambulatory Visit (HOSPITAL_COMMUNITY): Payer: Medicare Other

## 2021-07-01 DIAGNOSIS — R778 Other specified abnormalities of plasma proteins: Secondary | ICD-10-CM

## 2021-07-01 DIAGNOSIS — R4182 Altered mental status, unspecified: Secondary | ICD-10-CM | POA: Diagnosis not present

## 2021-07-01 DIAGNOSIS — Z452 Encounter for adjustment and management of vascular access device: Secondary | ICD-10-CM | POA: Diagnosis not present

## 2021-07-01 DIAGNOSIS — I248 Other forms of acute ischemic heart disease: Secondary | ICD-10-CM

## 2021-07-01 DIAGNOSIS — J101 Influenza due to other identified influenza virus with other respiratory manifestations: Secondary | ICD-10-CM | POA: Diagnosis not present

## 2021-07-01 DIAGNOSIS — J9620 Acute and chronic respiratory failure, unspecified whether with hypoxia or hypercapnia: Secondary | ICD-10-CM | POA: Diagnosis not present

## 2021-07-01 LAB — FIBRINOGEN: Fibrinogen: 558 mg/dL — ABNORMAL HIGH (ref 210–475)

## 2021-07-01 LAB — BASIC METABOLIC PANEL WITH GFR
Anion gap: 9 (ref 5–15)
BUN: 42 mg/dL — ABNORMAL HIGH (ref 8–23)
CO2: 26 mmol/L (ref 22–32)
Calcium: 8.1 mg/dL — ABNORMAL LOW (ref 8.9–10.3)
Chloride: 100 mmol/L (ref 98–111)
Creatinine, Ser: 2.17 mg/dL — ABNORMAL HIGH (ref 0.61–1.24)
GFR, Estimated: 32 mL/min — ABNORMAL LOW
Glucose, Bld: 349 mg/dL — ABNORMAL HIGH (ref 70–99)
Potassium: 6.4 mmol/L (ref 3.5–5.1)
Sodium: 135 mmol/L (ref 135–145)

## 2021-07-01 LAB — POCT I-STAT 7, (LYTES, BLD GAS, ICA,H+H)
Acid-base deficit: 3 mmol/L — ABNORMAL HIGH (ref 0.0–2.0)
Acid-base deficit: 1 mmol/L (ref 0.0–2.0)
Acid-base deficit: 1 mmol/L (ref 0.0–2.0)
Acid-base deficit: 5 mmol/L — ABNORMAL HIGH (ref 0.0–2.0)
Acid-base deficit: 5 mmol/L — ABNORMAL HIGH (ref 0.0–2.0)
Acid-base deficit: 4 mmol/L — ABNORMAL HIGH (ref 0.0–2.0)
Acid-base deficit: 3 mmol/L — ABNORMAL HIGH (ref 0.0–2.0)
Bicarbonate: 29.8 mmol/L — ABNORMAL HIGH (ref 20.0–28.0)
Bicarbonate: 30 mmol/L — ABNORMAL HIGH (ref 20.0–28.0)
Bicarbonate: 30.7 mmol/L — ABNORMAL HIGH (ref 20.0–28.0)
Bicarbonate: 27.1 mmol/L (ref 20.0–28.0)
Bicarbonate: 23.7 mmol/L (ref 20.0–28.0)
Bicarbonate: 26.4 mmol/L (ref 20.0–28.0)
Bicarbonate: 26.2 mmol/L (ref 20.0–28.0)
Calcium, Ion: 1.15 mmol/L (ref 1.15–1.40)
Calcium, Ion: 1.14 mmol/L — ABNORMAL LOW (ref 1.15–1.40)
Calcium, Ion: 1.15 mmol/L (ref 1.15–1.40)
Calcium, Ion: 1.11 mmol/L — ABNORMAL LOW (ref 1.15–1.40)
Calcium, Ion: 1.06 mmol/L — ABNORMAL LOW (ref 1.15–1.40)
Calcium, Ion: 1.17 mmol/L (ref 1.15–1.40)
Calcium, Ion: 1.13 mmol/L — ABNORMAL LOW (ref 1.15–1.40)
HCT: 53 % — ABNORMAL HIGH (ref 39.0–52.0)
HCT: 54 % — ABNORMAL HIGH (ref 39.0–52.0)
HCT: 50 % (ref 39.0–52.0)
HCT: 51 % (ref 39.0–52.0)
HCT: 46 % (ref 39.0–52.0)
HCT: 52 % (ref 39.0–52.0)
HCT: 48 % (ref 39.0–52.0)
Hemoglobin: 18 g/dL — ABNORMAL HIGH (ref 13.0–17.0)
Hemoglobin: 18.4 g/dL — ABNORMAL HIGH (ref 13.0–17.0)
Hemoglobin: 17 g/dL (ref 13.0–17.0)
Hemoglobin: 17.3 g/dL — ABNORMAL HIGH (ref 13.0–17.0)
Hemoglobin: 15.6 g/dL (ref 13.0–17.0)
Hemoglobin: 17.7 g/dL — ABNORMAL HIGH (ref 13.0–17.0)
Hemoglobin: 16.3 g/dL (ref 13.0–17.0)
O2 Saturation: 97 %
O2 Saturation: 98 %
O2 Saturation: 98 %
O2 Saturation: 91 %
O2 Saturation: 92 %
O2 Saturation: 91 %
O2 Saturation: 94 %
Patient temperature: 98.6
Patient temperature: 96.7
Patient temperature: 96.4
Patient temperature: 98.3
Patient temperature: 98.1
Patient temperature: 98.6
Patient temperature: 37
Potassium: 5 mmol/L (ref 3.5–5.1)
Potassium: 5.3 mmol/L — ABNORMAL HIGH (ref 3.5–5.1)
Potassium: 5.7 mmol/L — ABNORMAL HIGH (ref 3.5–5.1)
Potassium: 5.8 mmol/L — ABNORMAL HIGH (ref 3.5–5.1)
Potassium: 3.7 mmol/L (ref 3.5–5.1)
Potassium: 4.3 mmol/L (ref 3.5–5.1)
Potassium: 4.5 mmol/L (ref 3.5–5.1)
Sodium: 138 mmol/L (ref 135–145)
Sodium: 136 mmol/L (ref 135–145)
Sodium: 135 mmol/L (ref 135–145)
Sodium: 136 mmol/L (ref 135–145)
Sodium: 138 mmol/L (ref 135–145)
Sodium: 139 mmol/L (ref 135–145)
Sodium: 137 mmol/L (ref 135–145)
TCO2: 33 mmol/L — ABNORMAL HIGH (ref 22–32)
TCO2: 32 mmol/L (ref 22–32)
TCO2: 33 mmol/L — ABNORMAL HIGH (ref 22–32)
TCO2: 30 mmol/L (ref 22–32)
TCO2: 26 mmol/L (ref 22–32)
TCO2: 28 mmol/L (ref 22–32)
TCO2: 28 mmol/L (ref 22–32)
pCO2 arterial: 93.4 mmHg (ref 32.0–48.0)
pCO2 arterial: 74.4 mmHg (ref 32.0–48.0)
pCO2 arterial: 77 mmHg (ref 32.0–48.0)
pCO2 arterial: 82.7 mmHg (ref 32.0–48.0)
pCO2 arterial: 59.3 mmHg — ABNORMAL HIGH (ref 32.0–48.0)
pCO2 arterial: 68.5 mmHg (ref 32.0–48.0)
pCO2 arterial: 63.2 mmHg — ABNORMAL HIGH (ref 32.0–48.0)
pH, Arterial: 7.112 — CL (ref 7.350–7.450)
pH, Arterial: 7.207 — ABNORMAL LOW (ref 7.350–7.450)
pH, Arterial: 7.202 — ABNORMAL LOW (ref 7.350–7.450)
pH, Arterial: 7.122 — CL (ref 7.350–7.450)
pH, Arterial: 7.208 — ABNORMAL LOW (ref 7.350–7.450)
pH, Arterial: 7.194 — CL (ref 7.350–7.450)
pH, Arterial: 7.227 — ABNORMAL LOW (ref 7.350–7.450)
pO2, Arterial: 128 mmHg — ABNORMAL HIGH (ref 83.0–108.0)
pO2, Arterial: 131 mmHg — ABNORMAL HIGH (ref 83.0–108.0)
pO2, Arterial: 121 mmHg — ABNORMAL HIGH (ref 83.0–108.0)
pO2, Arterial: 83 mmHg (ref 83.0–108.0)
pO2, Arterial: 79 mmHg — ABNORMAL LOW (ref 83.0–108.0)
pO2, Arterial: 77 mmHg — ABNORMAL LOW (ref 83.0–108.0)
pO2, Arterial: 85 mmHg (ref 83.0–108.0)

## 2021-07-01 LAB — CBC WITH DIFFERENTIAL/PLATELET
Abs Immature Granulocytes: 0.07 K/uL (ref 0.00–0.07)
Basophils Absolute: 0 K/uL (ref 0.0–0.1)
Basophils Relative: 0 %
Eosinophils Absolute: 0 K/uL (ref 0.0–0.5)
Eosinophils Relative: 0 %
HCT: 53.6 % — ABNORMAL HIGH (ref 39.0–52.0)
Hemoglobin: 16.7 g/dL (ref 13.0–17.0)
Immature Granulocytes: 1 %
Lymphocytes Relative: 8 %
Lymphs Abs: 1 K/uL (ref 0.7–4.0)
MCH: 30.3 pg (ref 26.0–34.0)
MCHC: 31.2 g/dL (ref 30.0–36.0)
MCV: 97.1 fL (ref 80.0–100.0)
Monocytes Absolute: 0.5 K/uL (ref 0.1–1.0)
Monocytes Relative: 4 %
Neutro Abs: 10.5 K/uL — ABNORMAL HIGH (ref 1.7–7.7)
Neutrophils Relative %: 87 %
Platelets: 135 K/uL — ABNORMAL LOW (ref 150–400)
RBC: 5.52 MIL/uL (ref 4.22–5.81)
RDW: 14.3 % (ref 11.5–15.5)
WBC: 12 K/uL — ABNORMAL HIGH (ref 4.0–10.5)
nRBC: 0 % (ref 0.0–0.2)

## 2021-07-01 LAB — GLUCOSE, CAPILLARY
Glucose-Capillary: 274 mg/dL — ABNORMAL HIGH (ref 70–99)
Glucose-Capillary: 262 mg/dL — ABNORMAL HIGH (ref 70–99)
Glucose-Capillary: 311 mg/dL — ABNORMAL HIGH (ref 70–99)
Glucose-Capillary: 313 mg/dL — ABNORMAL HIGH (ref 70–99)
Glucose-Capillary: 333 mg/dL — ABNORMAL HIGH (ref 70–99)
Glucose-Capillary: 279 mg/dL — ABNORMAL HIGH (ref 70–99)
Glucose-Capillary: 223 mg/dL — ABNORMAL HIGH (ref 70–99)
Glucose-Capillary: 166 mg/dL — ABNORMAL HIGH (ref 70–99)
Glucose-Capillary: 198 mg/dL — ABNORMAL HIGH (ref 70–99)
Glucose-Capillary: 191 mg/dL — ABNORMAL HIGH (ref 70–99)
Glucose-Capillary: 149 mg/dL — ABNORMAL HIGH (ref 70–99)
Glucose-Capillary: 188 mg/dL — ABNORMAL HIGH (ref 70–99)
Glucose-Capillary: 158 mg/dL — ABNORMAL HIGH (ref 70–99)
Glucose-Capillary: 136 mg/dL — ABNORMAL HIGH (ref 70–99)

## 2021-07-01 LAB — BASIC METABOLIC PANEL
Anion gap: 12 (ref 5–15)
BUN: 34 mg/dL — ABNORMAL HIGH (ref 8–23)
CO2: 21 mmol/L — ABNORMAL LOW (ref 22–32)
Calcium: 7.9 mg/dL — ABNORMAL LOW (ref 8.9–10.3)
Chloride: 103 mmol/L (ref 98–111)
Creatinine, Ser: 1.9 mg/dL — ABNORMAL HIGH (ref 0.61–1.24)
GFR, Estimated: 38 mL/min — ABNORMAL LOW (ref >60)
Glucose, Bld: 262 mg/dL — ABNORMAL HIGH (ref 70–99)
Potassium: 5.4 mmol/L — ABNORMAL HIGH (ref 3.5–5.1)
Sodium: 136 mmol/L (ref 135–145)

## 2021-07-01 LAB — HEPARIN LEVEL (UNFRACTIONATED)
Heparin Unfractionated: 0.75 IU/mL — ABNORMAL HIGH (ref 0.30–0.70)
Heparin Unfractionated: 0.96 IU/mL — ABNORMAL HIGH (ref 0.30–0.70)

## 2021-07-01 LAB — ECHOCARDIOGRAM COMPLETE
Area-P 1/2: 7.29 cm2
Height: 71 in
Weight: 5019.43 oz

## 2021-07-01 LAB — CBC
HCT: 55.6 % — ABNORMAL HIGH (ref 39.0–52.0)
Hemoglobin: 16.9 g/dL (ref 13.0–17.0)
MCH: 29.6 pg (ref 26.0–34.0)
MCHC: 30.4 g/dL (ref 30.0–36.0)
MCV: 97.5 fL (ref 80.0–100.0)
Platelets: 80 10*3/uL — ABNORMAL LOW (ref 150–400)
RBC: 5.7 MIL/uL (ref 4.22–5.81)
RDW: 14.5 % (ref 11.5–15.5)
WBC: 8 10*3/uL (ref 4.0–10.5)
nRBC: 0 % (ref 0.0–0.2)

## 2021-07-01 LAB — MAGNESIUM
Magnesium: 2.6 mg/dL — ABNORMAL HIGH (ref 1.7–2.4)
Magnesium: 2.5 mg/dL — ABNORMAL HIGH (ref 1.7–2.4)
Magnesium: 2.6 mg/dL — ABNORMAL HIGH (ref 1.7–2.4)

## 2021-07-01 LAB — PHOSPHORUS
Phosphorus: 4.1 mg/dL (ref 2.5–4.6)
Phosphorus: 4.8 mg/dL — ABNORMAL HIGH (ref 2.5–4.6)

## 2021-07-01 LAB — SODIUM, URINE, RANDOM: Sodium, Ur: 79 mmol/L

## 2021-07-01 LAB — TROPONIN I (HIGH SENSITIVITY)
Troponin I (High Sensitivity): 298 ng/L (ref <18)
Troponin I (High Sensitivity): 345 ng/L (ref <18)

## 2021-07-01 LAB — SAVE SMEAR(SSMR), FOR PROVIDER SLIDE REVIEW

## 2021-07-01 LAB — PROCALCITONIN: Procalcitonin: 4.08 ng/mL

## 2021-07-01 LAB — LACTIC ACID, PLASMA: Lactic Acid, Venous: 1.6 mmol/L (ref 0.5–1.9)

## 2021-07-01 LAB — APTT: aPTT: 116 seconds — ABNORMAL HIGH (ref 24–36)

## 2021-07-01 LAB — PATHOLOGIST SMEAR REVIEW

## 2021-07-01 MED ORDER — SODIUM CHLORIDE 0.9% FLUSH: 10.0000 mL | INTRAVENOUS | Status: DC | PRN

## 2021-07-01 MED ORDER — SODIUM ZIRCONIUM CYCLOSILICATE 10 G PO PACK
10.0000 g | PACK | Freq: Once | ORAL | Status: AC
  Administered 2021-07-01: 10 g
  Filled 2021-07-01: qty 1

## 2021-07-01 MED ORDER — INSULIN DETEMIR 100 UNIT/ML ~~LOC~~ SOLN
10.0000 [IU] | Freq: Two times a day (BID) | SUBCUTANEOUS | Status: DC
  Administered 2021-07-01: 10 [IU] via SUBCUTANEOUS
  Filled 2021-07-01 (×2): qty 0.1

## 2021-07-01 MED ORDER — DEXMEDETOMIDINE HCL IN NACL 400 MCG/100ML IV SOLN
0.4000 ug/kg/h | INTRAVENOUS | Status: DC
  Administered 2021-07-01: 0.4 ug/kg/h via INTRAVENOUS
  Filled 2021-07-01: qty 100

## 2021-07-01 MED ORDER — INSULIN ASPART 100 UNIT/ML IJ SOLN
2.0000 [IU] | INTRAMUSCULAR | Status: DC
  Administered 2021-07-01: 2 [IU] via SUBCUTANEOUS

## 2021-07-01 MED ORDER — CHLORHEXIDINE GLUCONATE 0.12% ORAL RINSE (MEDLINE KIT)
15.0000 mL | Freq: Two times a day (BID) | OROMUCOSAL | Status: DC
  Administered 2021-07-01 – 2021-07-07 (×13): 15 mL via OROMUCOSAL

## 2021-07-01 MED ORDER — SODIUM CHLORIDE 0.9% FLUSH
10.0000 mL | Freq: Two times a day (BID) | INTRAVENOUS | Status: DC
  Administered 2021-07-01: 10 mL
  Administered 2021-07-02: 30 mL
  Administered 2021-07-03 – 2021-07-04 (×3): 10 mL

## 2021-07-01 MED ORDER — VITAL HIGH PROTEIN PO LIQD
1000.0000 mL | ORAL | Status: DC
  Administered 2021-07-01: 1000 mL

## 2021-07-01 MED ORDER — OSELTAMIVIR PHOSPHATE 30 MG PO CAPS
30.0000 mg | ORAL_CAPSULE | Freq: Two times a day (BID) | ORAL | Status: DC
  Administered 2021-07-01 – 2021-07-03 (×5): 30 mg
  Filled 2021-07-01 (×6): qty 1

## 2021-07-01 MED ORDER — DEXTROSE 10 % IV SOLN: INTRAVENOUS | Status: DC

## 2021-07-01 MED ORDER — ORAL CARE MOUTH RINSE: 15.0000 mL | Freq: Two times a day (BID) | OROMUCOSAL | Status: DC

## 2021-07-01 MED ORDER — FUROSEMIDE 10 MG/ML IJ SOLN
20.0000 mg | Freq: Once | INTRAMUSCULAR | Status: AC
  Administered 2021-07-01: 20 mg via INTRAVENOUS
  Filled 2021-07-01: qty 2

## 2021-07-01 MED ORDER — FUROSEMIDE 10 MG/ML IJ SOLN
40.0000 mg | Freq: Once | INTRAMUSCULAR | Status: AC
  Administered 2021-07-01: 40 mg via INTRAVENOUS
  Filled 2021-07-01: qty 4

## 2021-07-01 MED ORDER — CHLORHEXIDINE GLUCONATE 0.12 % MT SOLN
15.0000 mL | Freq: Two times a day (BID) | OROMUCOSAL | Status: DC
  Administered 2021-07-01: 15 mL via OROMUCOSAL

## 2021-07-01 MED ORDER — DEXTROSE 50 % IV SOLN: 0.0000 mL | INTRAVENOUS | Status: DC | PRN

## 2021-07-01 MED ORDER — PROPOFOL 10 MG/ML IV BOLUS
INTRAVENOUS | Status: DC | PRN
  Administered 2021-07-01: 100 mg via INTRAVENOUS

## 2021-07-01 MED ORDER — PROSOURCE TF PO LIQD
45.0000 mL | Freq: Two times a day (BID) | ORAL | Status: DC
  Administered 2021-07-01: 45 mL
  Filled 2021-07-01: qty 45

## 2021-07-01 MED ORDER — METHYLPREDNISOLONE SODIUM SUCC 125 MG IJ SOLR
80.0000 mg | Freq: Two times a day (BID) | INTRAMUSCULAR | Status: AC
  Administered 2021-07-01 – 2021-07-02 (×4): 80 mg via INTRAVENOUS
  Filled 2021-07-01 (×5): qty 2

## 2021-07-01 MED ORDER — PERFLUTREN LIPID MICROSPHERE
1.0000 mL | INTRAVENOUS | Status: AC | PRN
Start: 2021-07-01 — End: 2021-07-01
  Administered 2021-07-01: 2.5 mL via INTRAVENOUS
  Filled 2021-07-01: qty 10

## 2021-07-01 MED ORDER — PANTOPRAZOLE 2 MG/ML SUSPENSION
40.0000 mg | Freq: Every day | ORAL | Status: DC
  Administered 2021-07-01 – 2021-07-03 (×3): 40 mg
  Filled 2021-07-01 (×3): qty 20

## 2021-07-01 MED ORDER — NOREPINEPHRINE 4 MG/250ML-% IV SOLN
0.0000 ug/min | INTRAVENOUS | Status: DC
  Administered 2021-07-01: 11 ug/min via INTRAVENOUS
  Administered 2021-07-01: 14 ug/min via INTRAVENOUS
  Administered 2021-07-01: 10 ug/min via INTRAVENOUS
  Administered 2021-07-02: 13 ug/min via INTRAVENOUS
  Administered 2021-07-02: 11 ug/min via INTRAVENOUS
  Filled 2021-07-01 (×5): qty 250

## 2021-07-01 MED ORDER — SUCCINYLCHOLINE CHLORIDE 200 MG/10ML IV SOSY
PREFILLED_SYRINGE | INTRAVENOUS | Status: DC | PRN
  Administered 2021-07-01: 120 mg via INTRAVENOUS

## 2021-07-01 MED ORDER — INSULIN REGULAR(HUMAN) IN NACL 100-0.9 UT/100ML-% IV SOLN
INTRAVENOUS | Status: DC
  Administered 2021-07-01: 14 [IU]/h via INTRAVENOUS
  Administered 2021-07-02: 6 [IU]/h via INTRAVENOUS
  Filled 2021-07-01 (×2): qty 100

## 2021-07-01 MED ORDER — METHYLPREDNISOLONE SODIUM SUCC 40 MG IJ SOLR
40.0000 mg | Freq: Two times a day (BID) | INTRAMUSCULAR | Status: DC
  Filled 2021-07-01: qty 1

## 2021-07-01 MED ORDER — INSULIN ASPART 100 UNIT/ML IJ SOLN
3.0000 [IU] | INTRAMUSCULAR | Status: DC
Start: 2021-07-01 — End: 2021-07-01
  Administered 2021-07-01: 9 [IU] via SUBCUTANEOUS

## 2021-07-01 MED ORDER — HEPARIN BOLUS VIA INFUSION
4000.0000 [IU] | Freq: Once | INTRAVENOUS | Status: AC
  Administered 2021-07-01: 4000 [IU] via INTRAVENOUS
  Filled 2021-07-01: qty 4000

## 2021-07-01 MED ORDER — ADULT MULTIVITAMIN W/MINERALS CH
1.0000 | ORAL_TABLET | Freq: Every day | ORAL | Status: DC
  Administered 2021-07-01 – 2021-07-03 (×3): 1
  Filled 2021-07-01 (×3): qty 1

## 2021-07-01 MED ORDER — ORAL CARE MOUTH RINSE
15.0000 mL | OROMUCOSAL | Status: DC
  Administered 2021-07-01 – 2021-07-03 (×19): 15 mL via OROMUCOSAL

## 2021-07-01 MED ORDER — METHYLPREDNISOLONE SODIUM SUCC 125 MG IJ SOLR
125.0000 mg | Freq: Every day | INTRAMUSCULAR | Status: DC
Start: 2021-07-01 — End: 2021-07-01

## 2021-07-01 MED ORDER — SODIUM POLYSTYRENE SULFONATE 15 GM/60ML PO SUSP
30.0000 g | Freq: Once | ORAL | Status: DC
  Filled 2021-07-01: qty 120

## 2021-07-01 MED ORDER — NOREPINEPHRINE 4 MG/250ML-% IV SOLN
INTRAVENOUS | Status: AC
  Administered 2021-07-01: 2 ug/min via INTRAVENOUS
  Filled 2021-07-01: qty 250

## 2021-07-01 MED ORDER — FENTANYL 2500MCG IN NS 250ML (10MCG/ML) PREMIX INFUSION
0.0000 ug/h | INTRAVENOUS | Status: DC
  Administered 2021-07-01: 50 ug/h via INTRAVENOUS
  Administered 2021-07-02 – 2021-07-03 (×2): 150 ug/h via INTRAVENOUS
  Filled 2021-07-01 (×3): qty 250

## 2021-07-01 MED ORDER — PROSOURCE TF PO LIQD
90.0000 mL | Freq: Four times a day (QID) | ORAL | Status: DC
  Administered 2021-07-01 – 2021-07-03 (×7): 90 mL
  Filled 2021-07-01 (×7): qty 90

## 2021-07-01 MED ORDER — PROPOFOL 1000 MG/100ML IV EMUL
5.0000 ug/kg/min | INTRAVENOUS | Status: DC
  Administered 2021-07-01: 30 ug/kg/min via INTRAVENOUS
  Administered 2021-07-01 (×2): 25 ug/kg/min via INTRAVENOUS
  Administered 2021-07-02 (×2): 10 ug/kg/min via INTRAVENOUS
  Filled 2021-07-01 (×5): qty 100

## 2021-07-01 MED ORDER — SODIUM CHLORIDE 0.9 % IV SOLN
2.0000 g | INTRAVENOUS | Status: DC
  Administered 2021-07-01 – 2021-07-05 (×5): 2 g via INTRAVENOUS
  Filled 2021-07-01 (×5): qty 20

## 2021-07-01 MED ORDER — MIDAZOLAM HCL 2 MG/2ML IJ SOLN: 4.0000 mg | Freq: Once | INTRAMUSCULAR | Status: AC

## 2021-07-01 MED ORDER — CHLORHEXIDINE GLUCONATE CLOTH 2 % EX PADS
6.0000 | MEDICATED_PAD | Freq: Every day | CUTANEOUS | Status: DC
  Administered 2021-07-01 – 2021-07-08 (×8): 6 via TOPICAL

## 2021-07-01 MED ORDER — HEPARIN (PORCINE) 25000 UT/250ML-% IV SOLN
1250.0000 [IU]/h | INTRAVENOUS | Status: AC
  Administered 2021-07-01: 1700 [IU]/h via INTRAVENOUS
  Administered 2021-07-01: 1550 [IU]/h via INTRAVENOUS
  Administered 2021-07-02 – 2021-07-04 (×3): 1250 [IU]/h via INTRAVENOUS
  Filled 2021-07-01 (×5): qty 250

## 2021-07-01 MED ORDER — METHYLPREDNISOLONE SODIUM SUCC 125 MG IJ SOLR
125.0000 mg | Freq: Once | INTRAMUSCULAR | Status: AC
  Administered 2021-07-01: 125 mg via INTRAVENOUS
  Filled 2021-07-01: qty 2

## 2021-07-01 MED ORDER — PHENYLEPHRINE HCL (PRESSORS) 10 MG/ML IV SOLN
INTRAVENOUS | Status: DC | PRN
  Administered 2021-07-01: 80 ug via INTRAVENOUS

## 2021-07-01 MED ORDER — VITAL AF 1.2 CAL PO LIQD
1000.0000 mL | ORAL | Status: DC
  Administered 2021-07-01 – 2021-07-02 (×2): 1000 mL

## 2021-07-01 MED ORDER — MIDAZOLAM HCL 2 MG/2ML IJ SOLN
INTRAMUSCULAR | Status: AC
  Administered 2021-07-01: 2 mg via INTRAVENOUS
  Filled 2021-07-01: qty 4

## 2021-07-01 MED ORDER — ASPIRIN 81 MG PO CHEW
81.0000 mg | CHEWABLE_TABLET | Freq: Every day | ORAL | Status: DC
  Administered 2021-07-01 – 2021-07-03 (×3): 81 mg
  Filled 2021-07-01 (×3): qty 1

## 2021-07-01 NOTE — Procedures (Signed)
Arterial Catheter Insertion Procedure Note  Ademide Schaberg  [id number]  05-27-1951  Date:07/01/21  Time:9:31 AM    Provider Performing: Clementeen Graham    Procedure: Insertion of Arterial Line [phone]) with US guidance ([id number])   Indication(s) Blood pressure monitoring and/or need for frequent ABGs  Consent Risks of the procedure as well as the alternatives and risks of each were explained to the patient and/or caregiver.  Consent for the procedure was obtained and is signed in the bedside chart  Anesthesia None    Time Out Verified patient identification, verified procedure, site/side was marked, verified correct patient position, special equipment/implants available, medications/allergies/relevant history reviewed, required imaging and test results available.   Sterile Technique Maximal sterile technique including full sterile barrier drape, hand hygiene, sterile gown, sterile gloves, mask, hair covering, sterile ultrasound probe cover (if used).   Procedure Description Area of catheter insertion was cleaned with chlorhexidine and draped in sterile fashion. With real-time ultrasound guidance an arterial catheter was placed into the right radial artery.  Appropriate arterial tracings confirmed on monitor.     Complications/Tolerance None; patient tolerated the procedure well.   EBL Minimal   Specimen(s) None  Erick Colace ACNP-BC [place] Pager # [phone] OR # [phone] if no answer

## 2021-07-01 NOTE — Progress Notes (Signed)
Foley placed at 10:00 am per MD order using sterile protocol/procedure. 2 RNs at bedside Maurice March and Berniece Salines.  Urine specimens sent s/p insertion. Foley placed d/t 423cc in bladder via bladder scan.  Foley huddle done.

## 2021-07-01 NOTE — Progress Notes (Addendum)
ANTICOAGULATION CONSULT NOTE  Pharmacy Consult for Heparin Indication: atrial fibrillation  No Known Allergies  Patient Measurements: Height: '5\' 11"'  (180.3 cm) Weight: (!) 142.3 kg (313 lb 11.4 oz) IBW/kg (Calculated) : 75.3 Heparin Dosing Weight: 110 kg  Vital Signs: Temp: 98.3 F (36.8 C) (11/07 0755) Temp Source: Oral (11/07 0755) BP: 119/76 (11/07 0800) Pulse Rate: 80 (11/07 0800)  Labs: Recent Labs    06/30/21 1506 06/30/21 1523 06/30/21 1531 06/30/21 1623 06/30/21 1723 06/30/21 1844 07/01/21 0037 07/01/21 0202 07/01/21 0206 07/01/21 0240 07/01/21 0536 07/01/21 0902 07/01/21 0939  HGB 17.4*   < > 18.7*   < >  --   --   --  16.9  --    < > 17.0 16.7 17.3*  HCT 56.0*   < > 55.0*   < >  --   --   --  55.6*  --    < > 50.0 53.6* 51.0  PLT 97*  --   --   --   --   --   --  80*  --   --   --  135*  --   APTT  --   --   --   --  26  --   --   --   --   --   --   --   --   LABPROT  --   --   --   --  14.0  --   --   --   --   --   --   --   --   INR  --   --   --   --  1.1  --   --   --   --   --   --   --   --   HEPARINUNFRC  --   --   --   --   --   --   --   --   --   --   --  0.75*  --   CREATININE 1.67*  --  1.60*  --   --  2.26* 1.90*  --   --   --   --   --   --   TROPONINIHS  --    < >  --   --   --  461* 298*  --  345*  --   --   --   --    < > = values in this interval not displayed.     Estimated Creatinine Clearance: 53 mL/min (A) (by C-G formula based on SCr of 1.9 mg/dL (H)).   Medical History: Past Medical History:  Diagnosis Date   Abnormal EKG 12/03/2017   Acute bronchitis 12/29/2016   Adenomatous polyps    Chest discomfort 12/03/2017   COPD (chronic obstructive pulmonary disease) ([place])    Diabetes mellitus ([place]) 12/03/2017   Emphysema/COPD ([facility])    GERD (gastroesophageal reflux disease)    Hyperlipidemia 12/21/2015   Hypertension    not on meds    Leukocytosis 09/27/2014   Multiple pulmonary nodules    Pneumonia    hx of 2017    Sepsis  ([place]) 09/27/2014   Tobacco abuse    Umbilical hernia 03/19/2034    Medications:  Scheduled:   aspirin  81 mg Per Tube Daily   chlorhexidine  15 mL Mouth Rinse BID   chlorhexidine gluconate (MEDLINE KIT)  15 mL Mouth Rinse BID   Chlorhexidine Gluconate Cloth  6 each Topical Daily   feeding supplement (PROSource TF)  45 mL Per Tube BID   feeding supplement (VITAL HIGH PROTEIN)  1,000 mL Per Tube Q24H   insulin aspart  2 Units Subcutaneous Q4H   insulin aspart  3-9 Units Subcutaneous Q4H   insulin detemir  10 Units Subcutaneous BID   mouth rinse  15 mL Mouth Rinse q12n4p   mouth rinse  15 mL Mouth Rinse 10 times per day   methylPREDNISolone (SOLU-MEDROL) injection  80 mg Intravenous Q12H   Followed by   Derrill Memo ON 07/03/2021] methylPREDNISolone (SOLU-MEDROL) injection  40 mg Intravenous Q12H   oseltamivir  30 mg Per Tube BID   pantoprazole sodium  40 mg Per Tube Daily   sodium polystyrene  30 g Rectal Once   sodium zirconium cyclosilicate  10 g Per Tube Once   umeclidinium bromide  1 puff Inhalation Daily    Assessment: 70 y.o. male with elevated troponin/Afib for heparin. Had been on Xarelto in the past - per family has been off since 2020? for previous DVT.  Initial heparin level came back slightly supratherapeutic at 0.75, on 1700 units/hr. Given unclear history on DOAC use, collected aPTT which was also supratherapeutic - correlating with heparin level - will only monitor heparin level. Hgb 17, plt 80 (will monitor). No s/sx of bleeding or infusion issues.   Goal of Therapy:  Heparin level 0.3-0.7 units/ml Monitor platelets by anticoagulation protocol: Yes   Plan:  Reduce heparin infusion to to 1550 units/hr  Order heparin level in 6 hours Monitor daily HL, CBC, and for s/sx of bleeding   Antonietta Jewel, PharmD, [place] Pharmacist  Phone: [phone] 07/01/2021 10:33 AM  Please check AMION for all [place] phone numbers After 10:00 PM, call [place]  [phone]

## 2021-07-01 NOTE — Consult Note (Signed)
NAME:  Manuel Leonard, MRN:  [id number], DOB:  Jan 27, 1951, LOS: 1 ADMISSION DATE:  06/30/2021, CONSULTATION DATE: 07/01/2016 REFERRING MD:  Dr. Gwendlyn Deutscher, CHIEF COMPLAINT: Shock  History of Present Illness:  Manuel Leonard is a 70 y.o. male with a past medical history significant for COPD, atrial fibrillation anticoagulated on Xarelto hypertension, hyperlipidemia, diabetes, and tobacco abuse who presented initially to the emergency department after family found patient unresponsive with right-sided gaze.  Last known normal 10 PM patient was also seen significantly hypoxic with oxygen saturation 70% on room air.  Code stroke called on admission.  On arrival patient was seen minimally responsive with temp 101.7, tachypnea, tachycardia, and minimal responsiveness.  Patient was placed on BiPAP with initial ABG; 7.26 /70.2 /450 /31.  Neurology was consulted and patient underwent stat Head CT which was negative for any acute findings.  Decreased mentation felt secondary to hypercapnia.  Patient was initially admitted under family medicine teaching service who felt patient's presentation was likely related to sepsis secondary to CAP, patient was also found Flu A positive while in the emergency department patient's lab work continued to decline including worsening renal function, elevated lactic acid, and elevated troponin.  This prompted PCCM consultation.  Pertinent  Medical History  COPD Hypertension Hyperlipidemia Diabetes Tobacco abuse  Significant Hospital Events: Including procedures, antibiotic start and stop dates in addition to other pertinent events   11/7 admitted after being found unresponsive 11/7 intubated by anesthesiology - easy intubation.  11/7 mild troponin rise with pseudo-infarct pattern on EKG with no regional wall motion abnormalities.    Interim History / Subjective:  Intubated for persistent hypercarbia despite BiPAP.  Reported intermittent chest pain prior to  intubation.   Objective   Blood pressure 119/76, pulse 80, temperature 98.3 F (36.8 C), temperature source Oral, resp. rate 16, height 5\' 11"  (1.803 m), weight (!) 142.3 kg, SpO2 99 %.    Vent Mode: PRVC FiO2 (%):  [40 %-50 %] 40 % Set Rate:  [10 bmp-15 bmp] 15 bmp Vt Set:  [600 mL] 600 mL PEEP:  [5 cmH20-8 cmH20] 8 cmH20 Plateau Pressure:  [28 cmH20] 28 cmH20   Intake/Output Summary (Last 24 hours) at 07/01/2021 0848 Last data filed at 07/01/2021 0800 Gross per 24 hour  Intake 584.41 ml  Output 700 ml  Net -115.59 ml   Filed Weights   06/30/21 1509 07/01/21 0545  Weight: (!) 146 kg (!) 142.3 kg    Examination: General: Morbidly obese man  HEENT: orally intubated, OGT in place with no tracheal deviation.  Neuro: Sedated with no response to verbal stimulation.  CV: Irregularly irregular rate, s1s2 regular rate and rhythm, no murmur, rubs, or gallops,  extremities warm.  PULM: Diminished breath sounds bilaterally with no wheezing.  + double triggering with  peak high airway pressures.  GI: soft, bowel sounds hypoactive in all 4 quadrants, tender to palpation most noticeable right upper quadrant, very distended taunt abdomen Extremities: warm/dry, generalized nonpitting edema  Skin: no rashes or lesions  Ancillary tests  CXR personally reviewed and only mild interstitial pattern ABG show respiratory acidosis.  Cultures are negative.  Hyperkalemia.  Modest BNP and Troponin elevation both less than 500 EKG show new 59mm ST elevation in inferior leads but no inferior WMA on echo.   Assessment & Plan:  Critically ill due to distributive shock due to severe influenza A  Critically ill due to Acute Hypoxic and hypercapnic Respiratory Failure Due to Influenza A pneumonia History of COPD Tobacco abuse  Acute metabolic encephalopathy Hyperkalemia due to Acute kidney injury Demand-related ischemia History of DVT anticoagulated on Xarelto Transaminitis, possible shock liver vs.  NAFLD Thrombocytopenia due to shock  Type 2 diabetes  with hyperglycemia from steroids.   Principal problem is flu pneumonia on background of COPD and obesity.   Plan:  - Titrate NE to keep MAP>65 - Trend CVP and challenge with fluids  - Follow renal function.  - Continue bronchodilators and tapering six day course of steroids.  - Full ventilator support, begin weaning once hypercarbia corrects.  - Lung protective ventilation. Ppeak may be spuriously elevated due to body habitus.  - Continue CAP coverage pending culture results. - Tamiflu for 5 days.  - Basal/tube feed coverage insulin - EKG/echo reviewed - compatible with demand-ischemia and not NSTEACS (Dr Burt Knack concurs). ASA + heparin IV. Needs statin, smoking cessation in future.   Best Practice (right click and "Reselect all SmartList Selections" daily)   Diet/type: NPO will start tube feeds./  DVT prophylaxis: systemic heparin GI prophylaxis: PPI Lines: N/A needs CVC Foley:  N/A Code Status:  full code Last date of multidisciplinary goals of care discussion: brother and daughter updated at the bedside.   Labs   CBC: Recent Labs  Lab 06/30/21 1506 06/30/21 1528 06/30/21 1623 07/01/21 0202 07/01/21 0240 07/01/21 0436 07/01/21 0536  WBC 9.8  --   --  8.0  --   --   --   NEUTROABS 7.9*  --   --   --   --   --   --   HGB 17.4*   < > 16.3 16.9 18.0* 18.4* 17.0  HCT 56.0*   < > 48.0 55.6* 53.0* 54.0* 50.0  MCV 95.7  --   --  97.5  --   --   --   PLT 97*  --   --  80*  --   --   --    < > = values in this interval not displayed.     Basic Metabolic Panel: Recent Labs  Lab 06/30/21 1506 06/30/21 1528 06/30/21 1531 06/30/21 1623 06/30/21 1844 07/01/21 0037 07/01/21 0240 07/01/21 0436 07/01/21 0536  NA 136   < > 133*   < > 137 136 138 136 135  K 5.5*   < > 5.6*   < > 5.4* 5.4* 5.0 5.3* 5.7*  CL 98  --  104  --  100 103  --   --   --   CO2 23  --   --   --  24 21*  --   --   --   GLUCOSE 207*  --  209*  --   268* 262*  --   --   --   BUN 23  --  32*  --  33* 34*  --   --   --   CREATININE 1.67*  --  1.60*  --  2.26* 1.90*  --   --   --   CALCIUM 8.5*  --   --   --  8.4* 7.9*  --   --   --   MG  --   --   --   --   --  2.6*  --   --   --    < > = values in this interval not displayed.    GFR: Estimated Creatinine Clearance: 53 mL/min (A) (by C-G formula based on SCr of 1.9 mg/dL (H)). Recent Labs  Lab 06/30/21 1506 06/30/21  1750 06/30/21 2149 07/01/21 0037 07/01/21 0202  PROCALCITON  --   --   --  4.08  --   WBC 9.8  --   --   --  8.0  LATICACIDVEN  --  2.2* 2.6* 1.6  --      Liver Function Tests: Recent Labs  Lab 06/30/21 1506  AST 67*  ALT 50*  ALKPHOS 79  BILITOT 1.7*  PROT 7.1  ALBUMIN 3.8    No results for input(s): LIPASE, AMYLASE in the last 168 hours. No results for input(s): AMMONIA in the last 168 hours.  ABG    Component Value Date/Time   PHART 7.202 (L) 07/01/2021 0536   PCO2ART 77.0 (HH) 07/01/2021 0536   PO2ART 121 (H) 07/01/2021 0536   HCO3 30.7 (H) 07/01/2021 0536   TCO2 33 (H) 07/01/2021 0536   ACIDBASEDEF 1.0 07/01/2021 0536   O2SAT 98.0 07/01/2021 0536      Coagulation Profile: Recent Labs  Lab 06/30/21 1723  INR 1.1     Cardiac Enzymes: Cardiac Panel (last 3 results) Recent Labs    06/30/21 1844 07/01/21 0037 07/01/21 0206  TROPONINIHS 461* 298* 345*     HbA1C: Hgb A1c MFr Bld  Date/Time Value Ref Range Status  06/30/2021 05:50 PM 6.7 (H) 4.8 - 5.6 % Final    Comment:    (NOTE) Pre diabetes:          5.7%-6.4%  Diabetes:              >6.4%  Glycemic control for   <7.0% adults with diabetes     CBG: Recent Labs  Lab 06/30/21 1521 06/30/21 2211 07/01/21 0156 07/01/21 0702  GLUCAP 209* 273* 274* 262*     Critical care time:   CRITICAL CARE Performed by: Kipp Brood  Total critical care time: 50 minutes  Critical care time was exclusive of separately billable procedures and treating other  patients.  Critical care was necessary to treat or prevent imminent or life-threatening deterioration.  Critical care was time spent personally by me on the following activities: development of treatment plan with patient and/or surrogate as well as nursing, discussions with consultants, evaluation of patient's response to treatment, examination of patient, obtaining history from patient or surrogate, ordering and performing treatments and interventions, ordering and review of laboratory studies, ordering and review of radiographic studies, pulse oximetry and re-evaluation of patient's condition.  Kipp Brood, MD [facility] ICU Physician [place]  Pager: [phone] Or Epic Secure Chat After hours: [phone].  07/01/2021, 9:07 AM

## 2021-07-01 NOTE — Consult Note (Signed)
NAME:  Manuel Leonard, MRN:  [id number], DOB:  19-Sep-1950, LOS: 1 ADMISSION DATE:  06/30/2021, CONSULTATION DATE: 07/01/2016 REFERRING MD:  Dr. Gwendlyn Deutscher, CHIEF COMPLAINT: Shock  History of Present Illness:  Manuel Leonard is a 70 y.o. male with a past medical history significant for COPD, atrial fibrillation anticoagulated on Xarelto hypertension, hyperlipidemia, diabetes, and tobacco abuse who presented initially to the emergency department after family found patient unresponsive with right-sided gaze.  Last known normal 10 PM patient was also seen significantly hypoxic with oxygen saturation 70% on room air.  Code stroke called on admission.  On arrival patient was seen minimally responsive with temp 101.7, tachypnea, tachycardia, and minimal responsiveness.  Patient was placed on BiPAP with initial ABG; 7.26 /70.2 /450 /31.  Neurology was consulted and patient underwent stat Head CT which was negative for any acute findings.  Decreased mentation felt secondary to hypercapnia.  Patient was initially admitted under family medicine teaching service who felt patient's presentation was likely related to sepsis secondary to CAP, patient was also found Flu A positive while in the emergency department patient's lab work continued to decline including worsening renal function, elevated lactic acid, and elevated troponin.  This prompted PCCM consultation.  Pertinent  Medical History  COPD Hypertension Hyperlipidemia Diabetes Tobacco abuse  Significant Hospital Events: Including procedures, antibiotic start and stop dates in addition to other pertinent events   11/7 admitted after being found unresponsive  Interim History / Subjective:  Will open eyes to verbal stimuli and answer simple commands  Objective   Blood pressure 113/64, pulse (!) 106, temperature 98.1 F (36.7 C), temperature source Axillary, resp. rate (!) 26, height 5\' 11"  (1.803 m), weight (!) 146 kg, SpO2 99 %.    Vent  Mode: BIPAP FiO2 (%):  [50 %] 50 % Set Rate:  [10 bmp] 10 bmp PEEP:  [5 cmH20] 5 cmH20  No intake or output data in the 24 hours ending 07/01/21 0013 Filed Weights   06/30/21 1509  Weight: (!) 146 kg    Examination: General: Acute on chronic ill-appearing obese gentleman lying in bed in no acute distress HEENT: [place]/AT, MM pink/moist, PERRL,  Neuro: Will open eyes to verbal stimuli and can answer simple commands, lethargic CV: Irregularly irregular rate, s1s2 regular rate and rhythm, no murmur, rubs, or gallops,  PULM: Diminished breath sounds bilaterally, tolerating BiPAP currently, no accessory muscle use GI: soft, bowel sounds hypoactive in all 4 quadrants, tender to palpation most noticeable right upper quadrant, very distended taunt abdomen Extremities: warm/dry, generalized nonpitting edema  Skin: no rashes or lesions  Resolved Hospital Problem list     Assessment & Plan:  Shock -Patient meets criteria for sepsis on admission given fever of 101.7, respiratory rate 30, heart rate 124, elevated lactic acid, altered level of consciousness, and acute concern for possible underlying infection.  However at this time unable to rule out cardiogenic component as well as patient's extremities are cool and he is clammy to the touch P: Transfer to ICU now for close monitoring Continue BiPAP  Follow Pan cultures Continue empiric antibiotics No further IV hydration at this time Small dose IV Lasix now Trend lactic acid Procalcitonin Monitor urine output Echocardiogram Strict intake and output Cardiology consult as below  Acute Hypoxic and hypercapnic Respiratory Failure  Influenza A -Possible underlying bacterial pneumonia as well but imaging and clinical picture does not appear consistent with bacterial pneumonia History of COPD Tobacco abuse -1 pack/day smoker, 48 pack/year P: Continue BiPAP therapy Repeat  ABG Elevate head of bed Pulmonary hygiene as able Minimize  sedation CAP coverage Continue Tamiflu Bronchodilators  Acute metabolic encephalopathy -Multifactorial including undifferentiated shock and hypercapnia -Head CT negative, code stroke ruled out Plan: Maintain neuro protective measures; goal for eurothermia, euglycemia, eunatermia, normoxia, and PCO2 goal of 35-40 Nutrition and bowel regiment  Seizure precautions  Aspirations precautions  Continue to identify and treat underlying cause Frequent neurochecks  Acute kidney injury -Creatinine 1.08 04/2021 creatinine originally 1.60 on admission with significant bump of 2.264 hours later Hyperkalemia P: Follow renal function  Monitor urine output Trend Bmet Avoid nephrotoxins Ensure adequate renal perfusion  Obtain urine lytes  Consider renal ultrasound   Elevated troponin with reported chest pain -Troponin 198 on admission with bump to 461 on repeat draw P: Consult cardiology  repeat twelve-lead EKG in a.m. Start heparin drip Echocardiogram pending Continuous telemetry  History of DVT anticoagulated on Xarelto -D-dimer 2.39 on admission -CTA negative for acute PE P: Heparin drip as above Lower extremity Dopplers  Transaminitis -LFTs slightly elevated, concern for shock liver given early cardiogenic shock P: Avoid hepatotoxins Identify and treat underlying cause Trend LFTs  Thrombocytopenia -Platelets 97 on admission P: Trend CBC Monitor for signs of bleeding  Type 2 diabetes -Home medication includes metformin P: Hold home medications SSI CBG every 4 CBG goal 1 40-1 80  Best Practice (right click and "Reselect all SmartList Selections" daily)   Diet/type: NPO DVT prophylaxis: systemic heparin GI prophylaxis: PPI Lines: N/A Foley:  N/A Code Status:  full code Last date of multidisciplinary goals of care discussion: Pending   Labs   CBC: Recent Labs  Lab 06/30/21 1506 06/30/21 1528 06/30/21 1531 06/30/21 1623  WBC 9.8  --   --   --    NEUTROABS 7.9*  --   --   --   HGB 17.4* 18.4* 18.7* 16.3  HCT 56.0* 54.0* 55.0* 48.0  MCV 95.7  --   --   --   PLT 97*  --   --   --     Basic Metabolic Panel: Recent Labs  Lab 06/30/21 1506 06/30/21 1528 06/30/21 1531 06/30/21 1623 06/30/21 1844  NA 136 135 133* 135 137  K 5.5* 5.3* 5.6* 4.8 5.4*  CL 98  --  104  --  100  CO2 23  --   --   --  24  GLUCOSE 207*  --  209*  --  268*  BUN 23  --  32*  --  33*  CREATININE 1.67*  --  1.60*  --  2.26*  CALCIUM 8.5*  --   --   --  8.4*   GFR: Estimated Creatinine Clearance: 45.2 mL/min (A) (by C-G formula based on SCr of 2.26 mg/dL (H)). Recent Labs  Lab 06/30/21 1506 06/30/21 1750 06/30/21 2149  WBC 9.8  --   --   LATICACIDVEN  --  2.2* 2.6*    Liver Function Tests: Recent Labs  Lab 06/30/21 1506  AST 67*  ALT 50*  ALKPHOS 79  BILITOT 1.7*  PROT 7.1  ALBUMIN 3.8   No results for input(s): LIPASE, AMYLASE in the last 168 hours. No results for input(s): AMMONIA in the last 168 hours.  ABG    Component Value Date/Time   PHART 7.239 (L) 06/30/2021 1623   PCO2ART 71.6 (HH) 06/30/2021 1623   PO2ART 297 (H) 06/30/2021 1623   HCO3 30.6 (H) 06/30/2021 1623   TCO2 33 (H) 06/30/2021 1623   O2SAT 100.0  06/30/2021 1623     Coagulation Profile: Recent Labs  Lab 06/30/21 1723  INR 1.1    Cardiac Enzymes: No results for input(s): CKTOTAL, CKMB, CKMBINDEX, TROPONINI in the last 168 hours.  HbA1C: Hgb A1c MFr Bld  Date/Time Value Ref Range Status  06/30/2021 05:50 PM 6.7 (H) 4.8 - 5.6 % Final    Comment:    (NOTE) Pre diabetes:          5.7%-6.4%  Diabetes:              >6.4%  Glycemic control for   <7.0% adults with diabetes     CBG: Recent Labs  Lab 06/30/21 1521 06/30/21 2211  GLUCAP 209* 273*    Review of Systems:   Unable to assess  Past Medical History:  He,  has a past medical history of Abnormal EKG (12/03/2017), Acute bronchitis (12/29/2016), Adenomatous polyps, Chest discomfort  (12/03/2017), COPD (chronic obstructive pulmonary disease) ([place]), Diabetes mellitus ([place]) (12/03/2017), Emphysema/COPD ([place]), GERD (gastroesophageal reflux disease), Hyperlipidemia (12/21/2015), Hypertension, Leukocytosis (09/27/2014), Multiple pulmonary nodules, Pneumonia, Sepsis ([place]) (09/27/2014), Tobacco abuse, and Umbilical hernia (0/32/1224).   Surgical History:   Past Surgical History:  Procedure Laterality Date   APPENDECTOMY     HEMORRHOIDECTOMY WITH HEMORRHOID BANDING     HERNIA REPAIR     bilateral    INSERTION OF MESH N/A 10/21/2016   Procedure: INSERTION OF MESH PATCH;  Surgeon: Armandina Gemma, MD;  Location: [facility];  Service: General;  Laterality: N/A;   right index finger surgery - partial amputation      TONSILLECTOMY     UMBILICAL HERNIA REPAIR N/A 10/21/2016   Procedure: UMBILICAL HERNIA REPAIR;  Surgeon: Armandina Gemma, MD;  Location: [facility];  Service: General;  Laterality: N/A;     Social History:   reports that he has been smoking cigarettes. He has a 48.00 pack-year smoking history. He has never used smokeless tobacco. He reports current alcohol use. He reports that he does not use drugs.   Family History:  His family history includes Cancer in his father; Lung cancer in his brother; Stomach cancer in his brother.   Allergies No Known Allergies   Home Medications  Prior to Admission medications   Medication Sig Start Date End Date Taking? Authorizing Provider  aspirin 81 MG EC tablet Take 1 tablet (81 mg total) by mouth daily. 01/22/18   Richardo Priest, MD  budesonide-formoterol (SYMBICORT) 160-4.5 MCG/ACT inhaler Inhale 2 puffs into the lungs 2 (two) times daily. 10/02/14   Robbie Lis, MD  ipratropium-albuterol (DUONEB) 0.5-2.5 (3) MG/3ML SOLN Take 3 mLs by nebulization. As needed    [provider]  metFORMIN (GLUCOPHAGE) 500 MG tablet Take 2 (two) times daily with a meal by mouth.    [provider]  nitroGLYCERIN (NITROSTAT) 0.4 MG SL tablet Place 0.4  mg under the tongue every 5 (five) minutes as needed for chest pain.    [provider]  oxyCODONE-acetaminophen (PERCOCET) 5-325 MG tablet Take 1 tablet by mouth every 4 (four) hours as needed for moderate pain. 04/19/99   Delora Fuel, MD  rivaroxaban (XARELTO) 20 MG TABS tablet Take 1 tablet (20 mg total) by mouth daily with supper. To start after finishing starter pack 06/19/19 07/19/19  Lennice Sites, DO  Rivaroxaban 15 & 20 MG TBPK Follow package directions: Take one 15mg  tablet by mouth twice a day. On day 22, switch to one 20mg  tablet once a day. Take with food. 05/20/19   Curatolo,  Adam, DO     Critical care time:   CRITICAL CARE Performed by: Clemence Stillings D. Harris  Total critical care time: 55 minutes  Critical care time was exclusive of separately billable procedures and treating other patients.  Critical care was necessary to treat or prevent imminent or life-threatening deterioration.  Critical care was time spent personally by me on the following activities: development of treatment plan with patient and/or surrogate as well as nursing, discussions with consultants, evaluation of patient's response to treatment, examination of patient, obtaining history from patient or surrogate, ordering and performing treatments and interventions, ordering and review of laboratory studies, ordering and review of radiographic studies, pulse oximetry and re-evaluation of patient's condition.   Haelie Clapp D. Kenton Kingfisher, NP-C [facility] Personal contact information can be found on Amion  07/01/2021, 1:16 AM

## 2021-07-01 NOTE — Progress Notes (Signed)
PCCM progress note  Called and spoke to on-call cardiology Dr. Rodman Key regarding concern for possible evolving cardiogenic shock.  Reviewed case and concerning clinical features.  He recommended continuing to trend troponin and obtain stat echocardiogram.   Brittley Regner D. Kenton Kingfisher, NP-C [facility] Personal contact information can be found on Amion  07/01/2021, 1:31 AM

## 2021-07-01 NOTE — Anesthesia Procedure Notes (Signed)
Procedure Name: Intubation Date/Time: 07/01/2021 6:19 AM Performed by: Suzy Bouchard, CRNA Pre-anesthesia Checklist: Patient identified, Emergency Drugs available, Suction available, Patient being monitored and Timeout performed Patient Re-evaluated:Patient Re-evaluated prior to induction Oxygen Delivery Method: Ambu bag Preoxygenation: Pre-oxygenation with 100% oxygen Induction Type: IV induction and Rapid sequence Laryngoscope Size: Glidescope and 3 Grade View: Grade I Tube type: Oral Tube size: 8.0 mm Number of attempts: 1 Airway Equipment and Method: Stylet and Video-laryngoscopy Placement Confirmation: ETT inserted through vocal cords under direct vision, CO2 detector and breath sounds checked- equal and bilateral Secured at: 25 cm Tube secured with: Tape Dental Injury: Teeth and Oropharynx as per pre-operative assessment

## 2021-07-01 NOTE — Progress Notes (Signed)
eLink Physician-Brief Progress Note Patient Name: Manuel Leonard DOB: 03-31-1951 MRN: [id number]   Date of Service  07/01/2021  HPI/Events of Note  ABG on BiPAP = 7.112/93.4/128/29.8. Intubation?  eICU Interventions  Plan: Will request that the PCCM ground team evaluate the patient at bedside.      Intervention Category Major Interventions: Acid-Base disturbance - evaluation and management;Respiratory failure - evaluation and management  [name] 07/01/2021, 3:00 AM

## 2021-07-01 NOTE — Progress Notes (Signed)
eLink Physician-Brief Progress Note Patient Name: Manuel Leonard DOB: 1951-04-25 MRN: [id number]   Date of Service  07/01/2021  HPI/Events of Note  Hyperkalemia - K+ = 5.4. Lokelma PO ordered. Nursing not comfortable giving PO medication d/t tenuous on BiPAP.  eICU Interventions  Plan: D/C Lokelma PO. Kayexalate 30 gm per rectum now. Repeat BMP at 9 AM.     Intervention Category Major Interventions: Electrolyte abnormality - evaluation and management  Derren Suydam Eugene 07/01/2021, 1:57 AM

## 2021-07-01 NOTE — Progress Notes (Signed)
Initial Nutrition Assessment  DOCUMENTATION CODES:   Not applicable  INTERVENTION:   Tube feeding via OG tube: Change to Vital AF 1.2 at 40 ml/h (960 ml per day) Prosource TF 90 ml QID  Provides 1472 kcal (2034 kcal total with propofol), 160 gm protein, 779 ml free water daily.  MVI with minerals via tube daily.  NUTRITION DIAGNOSIS:   Inadequate oral intake related to inability to eat as evidenced by NPO status.  GOAL:   Provide needs based on ASPEN/SCCM guidelines  MONITOR:   Vent status, TF tolerance, Labs  REASON FOR ASSESSMENT:   Ventilator, Consult Enteral/tube feeding initiation and management  ASSESSMENT:   70 yo male admitted with sepsis, CAP, Flu A positive. PMH includes COPD, A fib, HTN, HLD, DM, tobacco abuse.  Spoke with daughter at bedside; patient typically eats well and weight has been stable at home.  Patient was on BiPAP overnight, but required intubation this morning. Received MD Consult for TF initiation and management. OG tube in place.  Currently receiving Vital High Protein at 40 ml/h with Prosource TF 45 ml BID to provide 1040 kcal, 106 gm protein, 803 ml free water daily.   Patient is currently intubated on ventilator support MV: 13.5 L/min Temp (24hrs), Avg:98.4 F (36.9 C), Min:96.7 F (35.9 C), Max:101.7 F (38.7 C)  Propofol: 21.3 ml/hr providing 562 kcal from lipid  Labs reviewed. K 5.8 CBG: [phone]  Medications reviewed and include Precedex, propofol, Levophed, Fentanyl.   Weight history reviewed. Most recent weight PTA is from 2 years ago.   NUTRITION - FOCUSED PHYSICAL EXAM:  Flowsheet Row Most Recent Value  Orbital Region No depletion  Upper Arm Region No depletion  Thoracic and Lumbar Region No depletion  Buccal Region Unable to assess  Temple Region No depletion  Clavicle Bone Region No depletion  Clavicle and Acromion Bone Region No depletion  Scapular Bone Region No depletion  Dorsal Hand No depletion   Patellar Region No depletion  Anterior Thigh Region No depletion  Posterior Calf Region No depletion  Edema (RD Assessment) Moderate  Hair Reviewed  Eyes Unable to assess  Mouth Unable to assess  Skin Reviewed  Nails Reviewed       Diet Order:   Diet Order             Diet NPO time specified Except for: Sips with Meds  Diet effective now                   EDUCATION NEEDS:   Not appropriate for education at this time  Skin:  Skin Assessment: Reviewed RN Assessment  Last BM:  11/7  Height:   Ht Readings from Last 1 Encounters:  06/30/21 5\' 11"  (1.803 m)    Weight:   Wt Readings from Last 1 Encounters:  07/01/21 (!) 142.3 kg    BMI:  Body mass index is 43.75 kg/m.  Estimated Nutritional Needs:   Kcal:  1950  Protein:  150-165 gm  Fluid:  >/= 2 L    Lucas Mallow, RD, LDN, CNSC Please refer to Amion for contact information.

## 2021-07-01 NOTE — Anesthesia Preprocedure Evaluation (Addendum)
Anesthesia Evaluation  Patient identified by MRN, date of birth, ID band Patient unresponsive    Reviewed: Allergy & Precautions, NPO status , Patient's Chart, lab work & pertinent test results  Airway Mallampati: III  TM Distance: >3 FB Neck ROM: Full    Dental  (+) Edentulous Upper, Edentulous Lower   Pulmonary neg pulmonary ROS, Current Smoker,     + decreased breath sounds (-) stridor     Cardiovascular hypertension,  Rhythm:Regular Rate:Normal  Echo 2016 - Left ventricle: The cavity size was normal. Wall thickness was normal. Systolic function was normal. The estimated ejection fraction was in the range of 55% to 60%. Wall motion was normal; there were no regional wall motion abnormalities. Doppler parameters are consistent with abnormal left ventricular relaxation (grade 1 diastolic dysfunction).   Impressions:  - Normal LV function; grade 1 diastolic dysfunction; no significant valvular regurgitation.     Neuro/Psych negative neurological ROS     GI/Hepatic Neg liver ROS, GERD  ,  Endo/Other  negative endocrine ROSdiabetes  Renal/GU negative Renal ROS     Musculoskeletal negative musculoskeletal ROS (+)   Abdominal   Peds  Hematology negative hematology ROS (+)   Anesthesia Other Findings   Reproductive/Obstetrics                            Anesthesia Physical Anesthesia Plan  ASA: 4  Anesthesia Plan: General   Post-op Pain Management:    Induction: Intravenous  PONV Risk Score and Plan:   Airway Management Planned: Oral ETT  Additional Equipment:   Intra-op Plan:   Post-operative Plan: Extubation in OR  Informed Consent:   Plan Discussed with:   Anesthesia Plan Comments:         Anesthesia Quick Evaluation

## 2021-07-01 NOTE — Progress Notes (Signed)
ANTICOAGULATION CONSULT NOTE  Pharmacy Consult for Heparin Indication: atrial fibrillation  No Known Allergies  Patient Measurements: Height: _0  (180.3 cm) Weight: (!) 142.3 kg (313 lb 11.4 oz) IBW/kg (Calculated) : 75.3 Heparin Dosing Weight: 110 kg  Vital Signs: Temp: 98.6 F (37 C) (11/07 1614) Temp Source: Oral (11/07 1614) BP: 101/62 (11/07 2015) Pulse Rate: 91 (11/07 2015)  Labs: Recent Labs    06/30/21 1506 06/30/21 1523 06/30/21 1723 06/30/21 1844 07/01/21 0037 07/01/21 0202 07/01/21 0206 07/01/21 0240 07/01/21 0902 07/01/21 0914 07/01/21 0939 07/01/21 1514 07/01/21 1543 07/01/21 1850  HGB 17.4*   < >  --   --   --  16.9  --    < > 16.7  --  17.3* 15.6 17.7*  --   HCT 56.0*   < >  --   --   --  55.6*  --    < > 53.6*  --  51.0 46.0 52.0  --   PLT 97*  --   --   --   --  80*  --   --  135*  --   --   --   --   --   APTT  --   --  26  --   --   --   --   --   --  116*  --   --   --   --   LABPROT  --   --  14.0  --   --   --   --   --   --   --   --   --   --   --   INR  --   --  1.1  --   --   --   --   --   --   --   --   --   --   --   HEPARINUNFRC  --   --   --   --   --   --   --   --  0.75*  --   --   --   --  0.96*  CREATININE 1.67*   < >  --  2.26* 1.90*  --   --   --  2.17*  --   --   --   --   --   TROPONINIHS  --    < >  --  461* 298*  --  345*  --   --   --   --   --   --   --    < > = values in this interval not displayed.     Estimated Creatinine Clearance: 46.4 mL/min (A) (by C-G formula based on SCr of 2.17 mg/dL (H)).   Medical History: Past Medical History:  Diagnosis Date   Abnormal EKG 12/03/2017   Acute bronchitis 12/29/2016   Adenomatous polyps    Chest discomfort 12/03/2017   COPD (chronic obstructive pulmonary disease) (HCC)    Diabetes mellitus ([place]) 12/03/2017   Emphysema/COPD (HCC)    GERD (gastroesophageal reflux disease)    Hyperlipidemia 12/21/2015   Hypertension    not on meds    Leukocytosis 09/27/2014   Multiple  pulmonary nodules    Pneumonia    hx of 2017    Sepsis ([place]) 09/27/2014   Tobacco abuse    Umbilical hernia 4/48/1856    Medications:  Scheduled:   aspirin  81 mg Per Tube Daily  chlorhexidine gluconate (MEDLINE KIT)  15 mL Mouth Rinse BID   Chlorhexidine Gluconate Cloth  6 each Topical Daily   feeding supplement (PROSource TF)  90 mL Per Tube QID   mouth rinse  15 mL Mouth Rinse 10 times per day   methylPREDNISolone (SOLU-MEDROL) injection  80 mg Intravenous Q12H   Followed by   Derrill Memo ON 07/03/2021] methylPREDNISolone (SOLU-MEDROL) injection  40 mg Intravenous Q12H   multivitamin with minerals  1 tablet Per Tube Daily   oseltamivir  30 mg Per Tube BID   pantoprazole sodium  40 mg Per Tube Daily   sodium chloride flush  10-40 mL Intracatheter Q12H   sodium polystyrene  30 g Rectal Once   umeclidinium bromide  1 puff Inhalation Daily    Assessment: 70 y.o. male with elevated troponin/Afib for heparin. Had been on Xarelto in the past - per family has been off since 2020? for previous DVT.  Heparin level came back supratherapeutic at 0.96 on 1550 units/hr.   Given unclear history on DOAC use, collected aPTT which was also supratherapeutic - correlating with heparin level - will only monitor heparin level. Hgb 17, plt 80 (will monitor). No s/sx of bleeding or infusion issues.   Goal of Therapy:  Heparin level 0.3-0.7 units/ml Monitor platelets by anticoagulation protocol: Yes   Plan:  Reduce heparin infusion to to 1400 units/hr  Order heparin level in 6 hours Monitor daily HL, CBC, and for s/sx of bleeding   Alanda Slim, PharmD, [facility] Clinical Pharmacist Please see AMION for all Pharmacists' Contact Phone Numbers 07/01/2021, 8:51 PM

## 2021-07-01 NOTE — Progress Notes (Signed)
eLink Physician-Brief Progress Note Patient Name: Manuel Leonard DOB: June 17, 1951 MRN: [id number]   Date of Service  07/01/2021  HPI/Events of Note  Patient is scheduled for MRI brain but he is on multiple pressors and bedside RN is concerned about transporting patient to MRI tonight. CT / CTA / CT perfusion done earlier.  eICU Interventions  Will defer MR to the AM unless neurology requires it tonight.        Kerry Kass Ignacio Lowder 07/01/2021, 9:32 PM

## 2021-07-01 NOTE — Procedures (Signed)
Central Venous Catheter Insertion Procedure Note  Kathy Wares  [id number]  April 28, 1951  Date:07/01/21  Time:9:30 AM   Provider Performing:Pete Johnette Abraham Kary Kos   Procedure: Insertion of Non-tunneled Central Venous [id number]) with US guidance (24235)   Indication(s) Medication administration and Difficult access  Consent Risks of the procedure as well as the alternatives and risks of each were explained to the patient and/or caregiver.  Consent for the procedure was obtained and is signed in the bedside chart  Anesthesia Topical only with 1% lidocaine   Timeout Verified patient identification, verified procedure, site/side was marked, verified correct patient position, special equipment/implants available, medications/allergies/relevant history reviewed, required imaging and test results available.  Sterile Technique Maximal sterile technique including full sterile barrier drape, hand hygiene, sterile gown, sterile gloves, mask, hair covering, sterile ultrasound probe cover (if used).  Procedure Description Area of catheter insertion was cleaned with chlorhexidine and draped in sterile fashion.  With real-time ultrasound guidance a central venous catheter was placed into the right internal jugular vein. Nonpulsatile blood flow and easy flushing noted in all ports.  The catheter was sutured in place and sterile dressing applied.  Complications/Tolerance None; patient tolerated the procedure well. Chest X-ray is ordered to verify placement for internal jugular or subclavian cannulation.   Chest x-ray is not ordered for femoral cannulation.  EBL Minimal  Specimen(s) None  Erick Colace ACNP-BC [place] Pager # [phone] OR # [phone] if no answer

## 2021-07-01 NOTE — Progress Notes (Signed)
ANTICOAGULATION CONSULT NOTE - Initial Consult  Pharmacy Consult for Heparin Indication: atrial fibrillation  No Known Allergies  Patient Measurements: Height: 5\' 11"  (180.3 cm) Weight: (!) 146 kg (321 lb 14 oz) IBW/kg (Calculated) : 75.3 Heparin Dosing Weight: 110 kg  Vital Signs: Temp: 98.1 F (36.7 C) (11/06 2214) Temp Source: Axillary (11/06 2214) BP: 113/64 (11/06 2330) Pulse Rate: 106 (11/06 2330)  Labs: Recent Labs    06/30/21 1506 06/30/21 1523 06/30/21 1528 06/30/21 1531 06/30/21 1623 06/30/21 1723 06/30/21 1844  HGB 17.4*  --  18.4* 18.7* 16.3  --   --   HCT 56.0*  --  54.0* 55.0* 48.0  --   --   PLT 97*  --   --   --   --   --   --   APTT  --   --   --   --   --  26  --   LABPROT  --   --   --   --   --  14.0  --   INR  --   --   --   --   --  1.1  --   CREATININE 1.67*  --   --  1.60*  --   --  2.26*  TROPONINIHS  --  198*  --   --   --   --  461*    Estimated Creatinine Clearance: 45.2 mL/min (A) (by C-G formula based on SCr of 2.26 mg/dL (H)).   Medical History: Past Medical History:  Diagnosis Date   Abnormal EKG 12/03/2017   Acute bronchitis 12/29/2016   Adenomatous polyps    Chest discomfort 12/03/2017   COPD (chronic obstructive pulmonary disease) (HCC)    Diabetes mellitus ([place]) 12/03/2017   Emphysema/COPD (HCC)    GERD (gastroesophageal reflux disease)    Hyperlipidemia 12/21/2015   Hypertension    not on meds    Leukocytosis 09/27/2014   Multiple pulmonary nodules    Pneumonia    hx of 2017    Sepsis ([place]) 09/27/2014   Tobacco abuse    Umbilical hernia 8/32/9191    Medications:  Scheduled:   aspirin EC  81 mg Oral Daily   furosemide  20 mg Intravenous Once   insulin aspart  0-15 Units Subcutaneous TID WC   oseltamivir  75 mg Oral BID   sodium zirconium cyclosilicate  10 g Oral TID   umeclidinium bromide  1 puff Inhalation Daily    Assessment: 70 y.o. male with elevated troponin/Afib for heparin  Goal of Therapy:  Heparin level  0.3-0.7 units/ml Monitor platelets by anticoagulation protocol: Yes   Plan:  Heparin 4000 units IV bolus, then start heparin 1700 units/hr Check heparin level in 8 hours.   Caryl Pina 07/01/2021,12:30 AM

## 2021-07-01 NOTE — Progress Notes (Signed)
Patient arrived to unit alert. While getting patient in bed  Gerald Leitz NP arrived at the bedside.and then Dr. Duwayne Heck arrived at bedside. POC to to transfer patient to ICU. Patient immediately transferred to critical care service. Patient in bed resting quietly. 0114 hand off report given to Cyd Silence RN

## 2021-07-02 ENCOUNTER — Inpatient Hospital Stay (HOSPITAL_COMMUNITY): Payer: Medicare Other

## 2021-07-02 DIAGNOSIS — J9601 Acute respiratory failure with hypoxia: Secondary | ICD-10-CM | POA: Diagnosis not present

## 2021-07-02 DIAGNOSIS — R569 Unspecified convulsions: Secondary | ICD-10-CM

## 2021-07-02 DIAGNOSIS — G250 Essential tremor: Secondary | ICD-10-CM

## 2021-07-02 LAB — POCT I-STAT 7, (LYTES, BLD GAS, ICA,H+H)
Acid-base deficit: 1 mmol/L (ref 0.0–2.0)
Acid-base deficit: 1 mmol/L (ref 0.0–2.0)
Bicarbonate: 27.4 mmol/L (ref 20.0–28.0)
Bicarbonate: 27.3 mmol/L (ref 20.0–28.0)
Calcium, Ion: 1.14 mmol/L — ABNORMAL LOW (ref 1.15–1.40)
Calcium, Ion: 1.14 mmol/L — ABNORMAL LOW (ref 1.15–1.40)
HCT: 50 % (ref 39.0–52.0)
HCT: 50 % (ref 39.0–52.0)
Hemoglobin: 17 g/dL (ref 13.0–17.0)
Hemoglobin: 17 g/dL (ref 13.0–17.0)
O2 Saturation: 99 %
O2 Saturation: 88 %
Patient temperature: 97.8
Patient temperature: 98.7
Potassium: 4.8 mmol/L (ref 3.5–5.1)
Potassium: 4.6 mmol/L (ref 3.5–5.1)
Sodium: 139 mmol/L (ref 135–145)
Sodium: 139 mmol/L (ref 135–145)
TCO2: 29 mmol/L (ref 22–32)
TCO2: 29 mmol/L (ref 22–32)
pCO2 arterial: 59.9 mmHg — ABNORMAL HIGH (ref 32.0–48.0)
pCO2 arterial: 59.5 mmHg — ABNORMAL HIGH (ref 32.0–48.0)
pH, Arterial: 7.266 — ABNORMAL LOW (ref 7.350–7.450)
pH, Arterial: 7.27 — ABNORMAL LOW (ref 7.350–7.450)
pO2, Arterial: 141 mmHg — ABNORMAL HIGH (ref 83.0–108.0)
pO2, Arterial: 63 mmHg — ABNORMAL LOW (ref 83.0–108.0)

## 2021-07-02 LAB — GLUCOSE, CAPILLARY
Glucose-Capillary: 114 mg/dL — ABNORMAL HIGH (ref 70–99)
Glucose-Capillary: 147 mg/dL — ABNORMAL HIGH (ref 70–99)
Glucose-Capillary: 162 mg/dL — ABNORMAL HIGH (ref 70–99)
Glucose-Capillary: 166 mg/dL — ABNORMAL HIGH (ref 70–99)
Glucose-Capillary: 168 mg/dL — ABNORMAL HIGH (ref 70–99)
Glucose-Capillary: 171 mg/dL — ABNORMAL HIGH (ref 70–99)
Glucose-Capillary: 177 mg/dL — ABNORMAL HIGH (ref 70–99)
Glucose-Capillary: 177 mg/dL — ABNORMAL HIGH (ref 70–99)
Glucose-Capillary: 179 mg/dL — ABNORMAL HIGH (ref 70–99)
Glucose-Capillary: 179 mg/dL — ABNORMAL HIGH (ref 70–99)
Glucose-Capillary: 179 mg/dL — ABNORMAL HIGH (ref 70–99)
Glucose-Capillary: 185 mg/dL — ABNORMAL HIGH (ref 70–99)
Glucose-Capillary: 192 mg/dL — ABNORMAL HIGH (ref 70–99)
Glucose-Capillary: 196 mg/dL — ABNORMAL HIGH (ref 70–99)
Glucose-Capillary: 197 mg/dL — ABNORMAL HIGH (ref 70–99)
Glucose-Capillary: 202 mg/dL — ABNORMAL HIGH (ref 70–99)
Glucose-Capillary: 233 mg/dL — ABNORMAL HIGH (ref 70–99)

## 2021-07-02 LAB — CBC
HCT: 50.5 % (ref 39.0–52.0)
Hemoglobin: 15.9 g/dL (ref 13.0–17.0)
MCH: 29.7 pg (ref 26.0–34.0)
MCHC: 31.5 g/dL (ref 30.0–36.0)
MCV: 94.4 fL (ref 80.0–100.0)
Platelets: 132 10*3/uL — ABNORMAL LOW (ref 150–400)
RBC: 5.35 MIL/uL (ref 4.22–5.81)
RDW: 14.1 % (ref 11.5–15.5)
WBC: 19.4 10*3/uL — ABNORMAL HIGH (ref 4.0–10.5)
nRBC: 0 % (ref 0.0–0.2)

## 2021-07-02 LAB — HEPARIN LEVEL (UNFRACTIONATED)
Heparin Unfractionated: 0.84 IU/mL — ABNORMAL HIGH (ref 0.30–0.70)
Heparin Unfractionated: 0.61 IU/mL (ref 0.30–0.70)

## 2021-07-02 LAB — PHOSPHORUS: Phosphorus: 3.7 mg/dL (ref 2.5–4.6)

## 2021-07-02 LAB — BASIC METABOLIC PANEL
Anion gap: 12 (ref 5–15)
BUN: 64 mg/dL — ABNORMAL HIGH (ref 8–23)
CO2: 24 mmol/L (ref 22–32)
Calcium: 8.3 mg/dL — ABNORMAL LOW (ref 8.9–10.3)
Chloride: 100 mmol/L (ref 98–111)
Creatinine, Ser: 2.49 mg/dL — ABNORMAL HIGH (ref 0.61–1.24)
GFR, Estimated: 27 mL/min — ABNORMAL LOW (ref >60)
Glucose, Bld: 164 mg/dL — ABNORMAL HIGH (ref 70–99)
Potassium: 4.7 mmol/L (ref 3.5–5.1)
Sodium: 136 mmol/L (ref 135–145)

## 2021-07-02 LAB — TRIGLYCERIDES: Triglycerides: 167 mg/dL — ABNORMAL HIGH (ref <150)

## 2021-07-02 LAB — TROPONIN I (HIGH SENSITIVITY)
Troponin I (High Sensitivity): 476 ng/L (ref <18)
Troponin I (High Sensitivity): 438 ng/L (ref <18)

## 2021-07-02 LAB — CK TOTAL AND CKMB (NOT AT ARMC)
CK, MB: 8.5 ng/mL — ABNORMAL HIGH (ref 0.5–5.0)
Relative Index: 2.3 (ref 0.0–2.5)
Total CK: 370 U/L (ref 49–397)

## 2021-07-02 LAB — MAGNESIUM
Magnesium: 2.7 mg/dL — ABNORMAL HIGH (ref 1.7–2.4)
Magnesium: 2.6 mg/dL — ABNORMAL HIGH (ref 1.7–2.4)

## 2021-07-02 LAB — URINE CULTURE: Culture: NO GROWTH

## 2021-07-02 LAB — LEGIONELLA PNEUMOPHILA SEROGP 1 UR AG: L. pneumophila Serogp 1 Ur Ag: NEGATIVE

## 2021-07-02 LAB — PROCALCITONIN: Procalcitonin: 5.6 ng/mL

## 2021-07-02 MED ORDER — MIDAZOLAM HCL 2 MG/2ML IJ SOLN: 1.0000 mg | INTRAMUSCULAR | Status: DC | PRN

## 2021-07-02 MED ORDER — FUROSEMIDE 10 MG/ML IJ SOLN
60.0000 mg | Freq: Once | INTRAMUSCULAR | Status: AC
  Administered 2021-07-02: 60 mg via INTRAVENOUS
  Filled 2021-07-02: qty 6

## 2021-07-02 MED ORDER — INSULIN DETEMIR 100 UNIT/ML ~~LOC~~ SOLN
20.0000 [IU] | Freq: Two times a day (BID) | SUBCUTANEOUS | Status: DC
  Administered 2021-07-02 – 2021-07-04 (×5): 20 [IU] via SUBCUTANEOUS
  Filled 2021-07-02 (×7): qty 0.2

## 2021-07-02 MED ORDER — DEXTROSE 10 % IV SOLN: INTRAVENOUS | Status: DC | PRN

## 2021-07-02 MED ORDER — INSULIN ASPART 100 UNIT/ML IJ SOLN
3.0000 [IU] | INTRAMUSCULAR | Status: DC
Start: 2021-07-02 — End: 2021-07-04
  Administered 2021-07-02: 9 [IU] via SUBCUTANEOUS
  Administered 2021-07-03: 3 [IU] via SUBCUTANEOUS
  Administered 2021-07-03: 6 [IU] via SUBCUTANEOUS
  Administered 2021-07-03: 3 [IU] via SUBCUTANEOUS
  Administered 2021-07-03: 9 [IU] via SUBCUTANEOUS
  Administered 2021-07-03: 6 [IU] via SUBCUTANEOUS

## 2021-07-02 MED ORDER — INSULIN ASPART 100 UNIT/ML IJ SOLN
7.0000 [IU] | INTRAMUSCULAR | Status: DC
  Administered 2021-07-02 – 2021-07-03 (×4): 7 [IU] via SUBCUTANEOUS

## 2021-07-02 NOTE — Progress Notes (Signed)
PCCM INTERVAL PROGRESS NOTE  PCCM Ground Team asked to evaluate patient at bedside for "seizure-like spell with associated desaturation, mix of Afib/Aflutter and marked bradycardia".   On arrival, patient sleeping but noticeably diaphoretic. Similar episode of rhythmic shaking of BUE, L > R noted on stimulation; patient awake and tracking during this episode and calmed/shaking ceased after < 20 seconds. Brother at bedside translating as patient primarily speaks [place] and patient able to follow commands.  Vitals notable for HR 80s (sinus rhythm with PACs), BP 90s-100s/60s (MAPs 60s), sat 97%.   Physical Examination: General: Chronically ill-appearing elderly man in NAD. HEENT: [place]/AT, anicteric sclera, PERRL, moist mucous membranes. Diaphoretic. ETT in place. Neuro: Lethargic. Responds to verbal stimuli. Following commands consistently. Moves all 4 extremities spontaneously. CV: RRR, no m/g/r. PULM: Breathing even and minimally labored on vent (PEEP 5, FiO2 50%). Lung fields diminished bilaterally with fair air movement. GI: Soft, nontender, mildly distended. Hypoactive bowel sounds. Extremities: Bilateral 1+ symmetric LE edema noted. Skin: Warm/dry, no rashes.  Labs ordered: troponin cycle, CK MB, CBC/CMP/Phos/Mg, PCT. CXR stable, ETT slightly higher than prior (9cm above carina vs. 6cm). Lasix 60mg  IV x 1 given (net +1.5L/24H)  EEG and MRI Brain pending, will f/u.  Lestine Mount, PA-C [facility] 07/02/21 4:33 AM  Please see [email or web address] for pager details.  From 7A-7P if no response, please call [phone] After hours, please call ELink [phone]

## 2021-07-02 NOTE — Progress Notes (Signed)
STAT LTM EEG hooked up and running - no initial skin breakdown - push button tested - neuro notified. [facility] monitoring.

## 2021-07-02 NOTE — Procedures (Addendum)
Patient Name: Manuel Leonard  MRN: [id number]  Epilepsy Attending: Lora Havens  Referring Physician/Provider: Dr Trevor Mace  Duration: 07/02/2021 0513 to 1200  Patient history: 70yo with episode of rhythmic shaking of BUE, L > R noted on stimulation; patient awake and tracking during this episode and calmed/shaking ceased after < 20 seconds. EEG to evaluate for seizure.  Level of alertness:  comatose  AEDs during EEG study: Propofol  Technical aspects: This EEG study was done with scalp electrodes positioned according to the 10-20 International system of electrode placement. Electrical activity was acquired at a sampling rate of 500Hz  and reviewed with a high frequency filter of 70Hz  and a low frequency filter of 1Hz . EEG data were recorded continuously and digitally stored.   Description: EEG showed continuous generalized polymorphic 3 to 6 Hz theta-delta slowing. Hyperventilation and photic stimulation were not performed.     Patient event button was pressed on 07/02/2021 at 0855 and 1107. Patient was noted to have bilateral upper extremity tremor-like movements. Concomitant EEG before, during and after the event did not show any EEG changes to suggest seizure.  ABNORMALITY - Continuous slow, generalized  IMPRESSION: This study is suggestive of moderate diffuse encephalopathy, nonspecific etiology but could be secondary to sedation. No seizures or epileptiform discharges were seen throughout the recording.  Patient event button was pressed on 07/02/2021 at 0855 and 1107 for bilateral upper extremity tremor-like movements without concomitant EEG change.  These episodes are most likely not epileptic.  Manuel Leonard

## 2021-07-02 NOTE — Progress Notes (Signed)
eLink Physician-Brief Progress Note Patient Name: Manuel Leonard DOB: Nov 20, 1950 MRN: [id number]   Date of Service  07/02/2021  HPI/Events of Note  Patient had a seizure like episode with tonic-clonic extremity movements, a vacant gaze and transient desaturation with spontaneous recovery, he is now having some bigeminy following the episode.  eICU Interventions  cEEG ordered, he is for MRI brain in the AM, Versed iv PRN "seizures" ordered, BMP and Mg+ ordered stat.        Kerry Kass Misty Foutz 07/02/2021, 12:05 AM

## 2021-07-02 NOTE — Progress Notes (Addendum)
Subjective: Patient noted to have bilateral right more than left upper extremity tremor-like movements, predominantly on stimulation.  Patient daughter and wife at bedside state patient has had right more than left upper extremity tremors at home especially when he is doing something like lifting his coffee mug, writing with a pen.  Patient's father died at the age of 47 so family is unsure if patient's father had tremors as well or not.  Family denies any episodes of stiffening, changes in walking, changes in speech.   ROS: Unable to obtain due to poor mental status  Examination  Vital signs in last 24 hours: Temp:  [97.2 F (36.2 C)-98.9 F (37.2 C)] 98.9 F (37.2 C) (11/08 0757) Pulse Rate:  [48-98] 78 (11/08 1000) Resp:  [0-30] 30 (11/08 1000) BP: (86-125)/(52-94) 120/77 (11/08 1000) SpO2:  [90 %-98 %] 91 % (11/08 1000) Arterial Line BP: (92-202)/(47-112) 120/60 (11/08 1000) FiO2 (%):  [40 %-50 %] 40 % (11/08 0900) Weight:  [143.3 kg] 143.3 kg (11/08 0448)  General: lying in bed, NAD CVS: pulse-normal rate and rhythm RS: Intubated, coarse breath sounds bilaterally Extremities: Warm, bilateral right more than left upper extremity twitching more prominent when stimulated patient is awake. Neuro: Awake, alert, follows simple one-step commands, PERRLA, EOMI, no apparent facial asymmetry, spontaneously moving upper extremities  Basic Metabolic Panel: Recent Labs  Lab 06/30/21 1506 06/30/21 1528 06/30/21 1531 06/30/21 1623 06/30/21 1844 07/01/21 0037 07/01/21 0240 07/01/21 0902 07/01/21 0939 07/01/21 1514 07/01/21 1543 07/01/21 1850 07/01/21 2056 07/02/21 0010 07/02/21 0136 07/02/21 0334  NA 136   < > 133*   < > 137 136   < > 135   < > 138 139  --  137 136 139  --   K 5.5*   < > 5.6*   < > 5.4* 5.4*   < > 6.4*   < > 3.7 4.3  --  4.5 4.7 4.8  --   CL 98  --  104  --  100 103  --  100  --   --   --   --   --  100  --   --   CO2 23  --   --   --  24 21*  --  26  --   --   --    --   --  24  --   --   GLUCOSE 207*  --  209*  --  268* 262*  --  349*  --   --   --   --   --  164*  --   --   BUN 23  --  32*  --  33* 34*  --  42*  --   --   --   --   --  64*  --   --   CREATININE 1.67*  --  1.60*  --  2.26* 1.90*  --  2.17*  --   --   --   --   --  2.49*  --   --   CALCIUM 8.5*  --   --   --  8.4* 7.9*  --  8.1*  --   --   --   --   --  8.3*  --   --   MG  --   --   --   --   --  2.6*  --  2.5*  --   --   --  2.6*  --  2.7*  --  2.6*  PHOS  --   --   --   --   --   --   --  4.1  --   --   --  4.8*  --   --   --  3.7   < > = values in this interval not displayed.    CBC: Recent Labs  Lab 06/30/21 1506 06/30/21 1528 07/01/21 0202 07/01/21 0240 07/01/21 0902 07/01/21 0939 07/01/21 1514 07/01/21 1543 07/01/21 2056 07/02/21 0136 07/02/21 0334  WBC 9.8  --  8.0  --  12.0*  --   --   --   --   --  19.4*  NEUTROABS 7.9*  --   --   --  10.5*  --   --   --   --   --   --   HGB 17.4*   < > 16.9   < > 16.7   < > 15.6 17.7* 16.3 17.0 15.9  HCT 56.0*   < > 55.6*   < > 53.6*   < > 46.0 52.0 48.0 50.0 50.5  MCV 95.7  --  97.5  --  97.1  --   --   --   --   --  94.4  PLT 97*  --  80*  --  135*  --   --   --   --   --  132*   < > = values in this interval not displayed.     Coagulation Studies: Recent Labs    06/30/21 1723  LABPROT 14.0  INR 1.1    Imaging: Personally reviewed  CT head without contrast 06/30/2021: No acute cortical infarct or hemorrhage. Moderate white matter changes most likely chronic microvascular ischemia.  CTA head and neck 11/6/20202:  1.No large vessel occlusion. 2. 60% proximal left ICA stenosis. 3. Mild intracranial atherosclerosis without a flow limiting proximal stenosis. 4. Patent vertebral arteries with limited assessment proximally dueto motion. 5. Negative CT perfusion. 6.  Aortic Atherosclerosis (ICD10-I70.0).  ASSESSMENT AND PLAN: 70 year old male who initially presented on 06/30/2021 as code stroke for unresponsiveness with  right gaze preference.  Initial stroke work-up was negative, MRI brain is pending.  Patient was then diagnosed with influenza type a and required intubation.  Since admission he has been noted to have intermittent bilateral upper extremity twitching movements and therefore EEG was ordered.  Influenza type a Acute respiratory failure due to influenza type a pneumonia Essential tremor -LTM EEG is suggestive of moderate diffuse encephalopathy without any ictal or interictal abnormality -Bilateral upper extremity twitching is most likely essential tremor which is likely exacerbated in the setting of toxic-metabolic process.  Recommendations -Patient does not need to be started on any medication currently.  However, if it is interfering with his daily activities, he can be seen as an outpatient and started on medications -We will discontinue LTM EEG -MRI brain without contrast ordered and pending, will review once done next-management of rest of comorbidities per primary team - Discussed diagnosis, assessment and plan with patient's family at bedside in detail as well as Dr. Lynetta Mare.  I have spent a total of  36  minutes with the patient reviewing hospital notes,  test results, labs and examining the patient as well as establishing an assessment and plan that was discussed personally with the patient's family at bedside and Dr. Lynetta Mare.  > 50% of time was spent in direct patient care.    Zeb Comfort [facility] For questions after 5pm please refer to AMION to reach the Neurologist on call

## 2021-07-02 NOTE — Progress Notes (Signed)
[place] for Heparin Indication: atrial fibrillation  No Known Allergies  Patient Measurements: Height: 5\' 11"  (180.3 cm) Weight: (!) 143.3 kg (315 lb 14.7 oz) IBW/kg (Calculated) : 75.3 Heparin Dosing Weight: 110 kg  Vital Signs: Temp: 98.9 F (37.2 C) (11/08 0757) Temp Source: Oral (11/08 0757) BP: 157/121 (11/08 1200) Pulse Rate: 107 (11/08 1200)  Labs: Recent Labs    06/30/21 1723 06/30/21 1844 07/01/21 0037 07/01/21 0202 07/01/21 0206 07/01/21 0240 07/01/21 0902 07/01/21 0914 07/01/21 0939 07/01/21 1850 07/01/21 2056 07/02/21 0010 07/02/21 0136 07/02/21 0334 07/02/21 0553 07/02/21 1127 07/02/21 1245  HGB  --   --   --  16.9  --    < > 16.7  --    < >  --    < >  --  17.0 15.9  --  17.0  --   HCT  --   --   --  55.6*  --    < > 53.6*  --    < >  --    < >  --  50.0 50.5  --  50.0  --   PLT  --   --   --  80*  --   --  135*  --   --   --   --   --   --  132*  --   --   --   APTT 26  --   --   --   --   --   --  116*  --   --   --   --   --   --   --   --   --   LABPROT 14.0  --   --   --   --   --   --   --   --   --   --   --   --   --   --   --   --   INR 1.1  --   --   --   --   --   --   --   --   --   --   --   --   --   --   --   --   HEPARINUNFRC  --   --   --   --   --    < > 0.75*  --   --  0.96*  --   --   --  0.84*  --   --  0.61  CREATININE  --    < > 1.90*  --   --   --  2.17*  --   --   --   --  2.49*  --   --   --   --   --   CKTOTAL  --   --   --   --   --   --   --   --   --   --   --   --   --  370  --   --   --   CKMB  --   --   --   --   --   --   --   --   --   --   --   --   --  8.5*  --   --   --   TROPONINIHS  --    < >  298*  --  345*  --   --   --   --   --   --   --   --  476* 438*  --   --    < > = values in this interval not displayed.     Estimated Creatinine Clearance: 40.6 mL/min (A) (by C-G formula based on SCr of 2.49 mg/dL (H)).   Medical History: Past Medical History:  Diagnosis  Date   Abnormal EKG 12/03/2017   Acute bronchitis 12/29/2016   Adenomatous polyps    Chest discomfort 12/03/2017   COPD (chronic obstructive pulmonary disease) (HCC)    Diabetes mellitus ([place]) 12/03/2017   Emphysema/COPD (HCC)    GERD (gastroesophageal reflux disease)    Hyperlipidemia 12/21/2015   Hypertension    not on meds    Leukocytosis 09/27/2014   Multiple pulmonary nodules    Pneumonia    hx of 2017    Sepsis ([place]) 09/27/2014   Tobacco abuse    Umbilical hernia 10/03/1978    Medications:  Scheduled:   aspirin  81 mg Per Tube Daily   chlorhexidine gluconate (MEDLINE KIT)  15 mL Mouth Rinse BID   Chlorhexidine Gluconate Cloth  6 each Topical Daily   feeding supplement (PROSource TF)  90 mL Per Tube QID   mouth rinse  15 mL Mouth Rinse 10 times per day   methylPREDNISolone (SOLU-MEDROL) injection  80 mg Intravenous Q12H   Followed by   Derrill Memo ON 07/03/2021] methylPREDNISolone (SOLU-MEDROL) injection  40 mg Intravenous Q12H   multivitamin with minerals  1 tablet Per Tube Daily   oseltamivir  30 mg Per Tube BID   pantoprazole sodium  40 mg Per Tube Daily   sodium chloride flush  10-40 mL Intracatheter Q12H   umeclidinium bromide  1 puff Inhalation Daily    Assessment: 70 y.o. male with elevated troponin/Afib for heparin. Had been on Xarelto in the past - per family has been off since 2020? for previous DVT.  Heparin level came back supratherapeutic at 0.61 on 1250 units/hr. Plt= 132   Goal of Therapy:  Heparin level 0.3-0.7 units/ml Monitor platelets by anticoagulation protocol: Yes   Plan:  Continue heparin at 1250 uinits/hr Monitor daily HL, CBC, and for s/sx of bleeding   Hildred Laser, PharmD Clinical Pharmacist **Pharmacist phone directory can now be found on [email or web address] ([email or web address]).  Listed under [facility].

## 2021-07-02 NOTE — Consult Note (Signed)
NAME:  Manuel Leonard, MRN:  [id number], DOB:  29-Oct-1950, LOS: 2 ADMISSION DATE:  06/30/2021, CONSULTATION DATE: 07/01/2016 REFERRING MD:  Dr. Gwendlyn Deutscher, CHIEF COMPLAINT: Shock  History of Present Illness:  Manuel Leonard is a 70 y.o. male with a past medical history significant for COPD, atrial fibrillation anticoagulated on Xarelto hypertension, hyperlipidemia, diabetes, and tobacco abuse who presented initially to the emergency department after family found patient unresponsive with right-sided gaze.  Last known normal 10 PM patient was also seen significantly hypoxic with oxygen saturation 70% on room air.  Code stroke called on admission.  On arrival patient was seen minimally responsive with temp 101.7, tachypnea, tachycardia, and minimal responsiveness.  Patient was placed on BiPAP with initial ABG; 7.26 /70.2 /450 /31.  Neurology was consulted and patient underwent stat Head CT which was negative for any acute findings.  Decreased mentation felt secondary to hypercapnia.  Patient was initially admitted under family medicine teaching service who felt patient's presentation was likely related to sepsis secondary to CAP, patient was also found Flu A positive while in the emergency department patient's lab work continued to decline including worsening renal function, elevated lactic acid, and elevated troponin.  This prompted PCCM consultation.  Pertinent  Medical History  COPD Hypertension Hyperlipidemia Diabetes Tobacco abuse  Significant Hospital Events: Including procedures, antibiotic start and stop dates in addition to other pertinent events   11/7 admitted after being found unresponsive 11/7 intubated by anesthesiology - easy intubation.  11/7 mild troponin rise with pseudo-infarct pattern on EKG with no regional wall motion abnormalities.  11/8 episodes of generalized tremulousness with associated desaturation. EEG negative. Seen by neurology, deemed essential  tremor  Interim History / Subjective:  Slowly improving hypercarbia.  Tremor overnight.  Patient follows commands. Denies anxiety or pain.   Objective   Blood pressure (!) 157/121, pulse (!) 107, temperature 98.9 F (37.2 C), temperature source Oral, resp. rate 20, height 5\' 11"  (1.803 m), weight (!) 143.3 kg, SpO2 98 %.    Vent Mode: PRVC FiO2 (%):  [40 %-50 %] 40 % Set Rate:  [26 bmp-30 bmp] 28 bmp Vt Set:  [600 mL] 600 mL PEEP:  [5 cmH20] 5 cmH20 Plateau Pressure:  [18 cmH20-29 cmH20] 18 cmH20   Intake/Output Summary (Last 24 hours) at 07/02/2021 1244 Last data filed at 07/02/2021 1200 Gross per 24 hour  Intake 3174.36 ml  Output 1230 ml  Net 1944.36 ml    Filed Weights   06/30/21 1509 07/01/21 0545 07/02/21 0448  Weight: (!) 146 kg (!) 142.3 kg (!) 143.3 kg    Examination: General: Morbidly obese man  HEENT: orally intubated, OGT in place with no tracheal deviation.  Neuro: somnolent, but will awake and follow commands. Coarse bilateral upper limb tremor CV: Irregularly irregular rate, s1s2 regular rate and rhythm, no murmur, rubs, or gallops,  extremities warm.  PULM: Diminished breath sounds bilaterally with no wheezing.  GI: soft, Extremities: warm/dry, generalized nonpitting edema  Skin: no rashes or lesions  Ancillary tests  CXR personally reviewed and only mild interstitial pattern ABG show respiratory acidosis improved from yesterday Cultures are negative.  Hyperkalemia.  Modest BNP and Troponin elevation both less than 500 EKG show new 63mm ST elevation in inferior leads but no inferior WMA on echo.   Assessment & Plan:  Critically ill due to distributive shock due to severe influenza A  Critically ill due to Acute Hypoxic and hypercapnic Respiratory Failure Due to Influenza A pneumonia History of COPD Tobacco abuse  Acute metabolic encephalopathy Hyperkalemia due to Acute kidney injury Demand-related ischemia History of DVT anticoagulated on  Xarelto Transaminitis, possible shock liver vs. NAFLD Thrombocytopenia due to shock  Type 2 diabetes  with hyperglycemia from steroids.  Essential tremor.  Principal problem is flu pneumonia on background of COPD and obesity.   Plan:  - Titrate NE to keep MAP>65 - Wean sedation as tolerated  - Continue bronchodilators and tapering six day course of steroids.  - Full ventilator support, begin weaning once hypercarbia corrects.  - Continue CAP coverage for 7 days - Tamiflu for 5 days.  - Insulin infusion per [employer]   Best Practice (right click and "Reselect all SmartList Selections" daily)   Diet/type: NPO will start tube feeds./  DVT prophylaxis: systemic heparin GI prophylaxis: PPI Lines: N/A needs CVC Foley:  N/A Code Status:  full code Last date of multidisciplinary goals of care discussion: brother and daughter updated at the bedside.   Labs   CBC: Recent Labs  Lab 06/30/21 1506 06/30/21 1528 07/01/21 0202 07/01/21 0240 07/01/21 0902 07/01/21 0939 07/01/21 1543 07/01/21 2056 07/02/21 0136 07/02/21 0334 07/02/21 1127  WBC 9.8  --  8.0  --  12.0*  --   --   --   --  19.4*  --   NEUTROABS 7.9*  --   --   --  10.5*  --   --   --   --   --   --   HGB 17.4*   < > 16.9   < > 16.7   < > 17.7* 16.3 17.0 15.9 17.0  HCT 56.0*   < > 55.6*   < > 53.6*   < > 52.0 48.0 50.0 50.5 50.0  MCV 95.7  --  97.5  --  97.1  --   --   --   --  94.4  --   PLT 97*  --  80*  --  135*  --   --   --   --  132*  --    < > = values in this interval not displayed.     Basic Metabolic Panel: Recent Labs  Lab 06/30/21 1506 06/30/21 1528 06/30/21 1531 06/30/21 1623 06/30/21 1844 07/01/21 0037 07/01/21 0240 07/01/21 0902 07/01/21 0939 07/01/21 1543 07/01/21 1850 07/01/21 2056 07/02/21 0010 07/02/21 0136 07/02/21 0334 07/02/21 1127  NA 136   < > 133*   < > 137 136   < > 135   < > 139  --  137 136 139  --  139  K 5.5*   < > 5.6*   < > 5.4* 5.4*   < > 6.4*   < > 4.3  --  4.5 4.7  4.8  --  4.6  CL 98  --  104  --  100 103  --  100  --   --   --   --  100  --   --   --   CO2 23  --   --   --  24 21*  --  26  --   --   --   --  24  --   --   --   GLUCOSE 207*  --  209*  --  268* 262*  --  349*  --   --   --   --  164*  --   --   --   BUN 23  --  32*  --  33*  34*  --  42*  --   --   --   --  64*  --   --   --   CREATININE 1.67*  --  1.60*  --  2.26* 1.90*  --  2.17*  --   --   --   --  2.49*  --   --   --   CALCIUM 8.5*  --   --   --  8.4* 7.9*  --  8.1*  --   --   --   --  8.3*  --   --   --   MG  --   --   --   --   --  2.6*  --  2.5*  --   --  2.6*  --  2.7*  --  2.6*  --   PHOS  --   --   --   --   --   --   --  4.1  --   --  4.8*  --   --   --  3.7  --    < > = values in this interval not displayed.    GFR: Estimated Creatinine Clearance: 40.6 mL/min (A) (by C-G formula based on SCr of 2.49 mg/dL (H)). Recent Labs  Lab 06/30/21 1506 06/30/21 1750 06/30/21 2149 07/01/21 0037 07/01/21 0202 07/01/21 0902 07/02/21 0334  PROCALCITON  --   --   --  4.08  --   --  5.60  WBC 9.8  --   --   --  8.0 12.0* 19.4*  LATICACIDVEN  --  2.2* 2.6* 1.6  --   --   --      Liver Function Tests: Recent Labs  Lab 06/30/21 1506  AST 67*  ALT 50*  ALKPHOS 79  BILITOT 1.7*  PROT 7.1  ALBUMIN 3.8    No results for input(s): LIPASE, AMYLASE in the last 168 hours. No results for input(s): AMMONIA in the last 168 hours.  ABG    Component Value Date/Time   PHART 7.270 (L) 07/02/2021 1127   PCO2ART 59.5 (H) 07/02/2021 1127   PO2ART 63 (L) 07/02/2021 1127   HCO3 27.3 07/02/2021 1127   TCO2 29 07/02/2021 1127   ACIDBASEDEF 1.0 07/02/2021 1127   O2SAT 88.0 07/02/2021 1127      Coagulation Profile: Recent Labs  Lab 06/30/21 1723  INR 1.1     Cardiac Enzymes: Cardiac Panel (last 3 results) Recent Labs    07/01/21 0206 07/02/21 0334 07/02/21 0553  CKTOTAL  --  370  --   CKMB  --  8.5*  --   TROPONINIHS 345* 476* 438*  RELINDX  --  2.3  --        HbA1C: Hgb A1c MFr Bld  Date/Time Value Ref Range Status  06/30/2021 05:50 PM 6.7 (H) 4.8 - 5.6 % Final    Comment:    (NOTE) Pre diabetes:          5.7%-6.4%  Diabetes:              >6.4%  Glycemic control for   <7.0% adults with diabetes     CBG: Recent Labs  Lab 07/02/21 0755 07/02/21 0858 07/02/21 1001 07/02/21 1103 07/02/21 1206  GLUCAP 192* 196* 177* 179* 179*     Critical care time:   CRITICAL CARE Performed by: Kipp Brood  Total critical care time: 40 minutes  Critical care time was exclusive of separately billable procedures and  treating other patients.  Critical care was necessary to treat or prevent imminent or life-threatening deterioration.  Critical care was time spent personally by me on the following activities: development of treatment plan with patient and/or surrogate as well as nursing, discussions with consultants, evaluation of patient's response to treatment, examination of patient, obtaining history from patient or surrogate, ordering and performing treatments and interventions, ordering and review of laboratory studies, ordering and review of radiographic studies, pulse oximetry and re-evaluation of patient's condition.  Kipp Brood, MD [facility] ICU Physician [place]  Pager: [phone] Or Epic Secure Chat After hours: [phone].  07/02/2021, 12:44 PM

## 2021-07-02 NOTE — Progress Notes (Signed)
[facility] Progress Note Patient Name: Manuel Leonard DOB: 1951-02-24 MRN: [id number]   Date of Service  07/02/2021  HPI/Events of Note  Patient is having frequent liquid stools, RN requesting Flexiseal.  eICU Interventions  Flexiseal ordered.        Kerry Kass Leean Amezcua 07/02/2021, 11:08 PM

## 2021-07-02 NOTE — Progress Notes (Signed)
eLink Physician-Brief Progress Note Patient Name: Manuel Leonard DOB: [birth year] MRN: [id number]   Date of Service  07/02/2021  HPI/Events of Note  Patient with arrhythmias (atrial fib-flutter then bradycardia), etiology unclear, this time he did not desaturate, it's not clear whether these findings are primary findings of possibly related to a neurologic diagnosis.  eICU Interventions  Will ask the PCCM ground team to stop by and take a closer look at the patient.        Kerry Kass Zita Ozimek 07/02/2021, 2:25 AM

## 2021-07-02 NOTE — Progress Notes (Signed)
LTM EEG disconnected - no skin breakdown at unhook.  

## 2021-07-02 NOTE — Progress Notes (Signed)
ANTICOAGULATION CONSULT NOTE  Pharmacy Consult for Heparin Indication: atrial fibrillation  No Known Allergies  Patient Measurements: Height: _0  (180.3 cm) Weight: (!) 142.3 kg (313 lb 11.4 oz) IBW/kg (Calculated) : 75.3 Heparin Dosing Weight: 110 kg  Vital Signs: Temp: 98.3 F (36.8 C) (11/08 0400) Temp Source: Axillary (11/08 0400) BP: 110/73 (11/08 0400) Pulse Rate: 80 (11/08 0400)  Labs: Recent Labs    06/30/21 1723 06/30/21 1844 07/01/21 0037 07/01/21 0202 07/01/21 0206 07/01/21 0240 07/01/21 0902 07/01/21 0914 07/01/21 0939 07/01/21 1850 07/01/21 2056 07/02/21 0010 07/02/21 0136 07/02/21 0334  HGB  --   --   --  16.9  --    < > 16.7  --    < >  --  16.3  --  17.0 15.9  HCT  --   --   --  55.6*  --    < > 53.6*  --    < >  --  48.0  --  50.0 50.5  PLT  --   --   --  80*  --   --  135*  --   --   --   --   --   --  132*  APTT 26  --   --   --   --   --   --  116*  --   --   --   --   --   --   LABPROT 14.0  --   --   --   --   --   --   --   --   --   --   --   --   --   INR 1.1  --   --   --   --   --   --   --   --   --   --   --   --   --   HEPARINUNFRC  --   --   --   --   --   --  0.75*  --   --  0.96*  --   --   --  0.84*  CREATININE  --  2.26* 1.90*  --   --   --  2.17*  --   --   --   --  2.49*  --   --   TROPONINIHS  --  461* 298*  --  345*  --   --   --   --   --   --   --   --   --    < > = values in this interval not displayed.     Estimated Creatinine Clearance: 40.4 mL/min (A) (by C-G formula based on SCr of 2.49 mg/dL (H)).   Medical History: Past Medical History:  Diagnosis Date   Abnormal EKG 12/03/2017   Acute bronchitis 12/29/2016   Adenomatous polyps    Chest discomfort 12/03/2017   COPD (chronic obstructive pulmonary disease) (HCC)    Diabetes mellitus ([place]) 12/03/2017   Emphysema/COPD (HCC)    GERD (gastroesophageal reflux disease)    Hyperlipidemia 12/21/2015   Hypertension    not on meds    Leukocytosis 09/27/2014   Multiple  pulmonary nodules    Pneumonia    hx of 2017    Sepsis ([place]) 09/27/2014   Tobacco abuse    Umbilical hernia 05/10/9449    Medications:  Scheduled:   aspirin  81 mg Per Tube Daily  chlorhexidine gluconate (MEDLINE KIT)  15 mL Mouth Rinse BID   Chlorhexidine Gluconate Cloth  6 each Topical Daily   feeding supplement (PROSource TF)  90 mL Per Tube QID   furosemide  60 mg Intravenous Once   mouth rinse  15 mL Mouth Rinse 10 times per day   methylPREDNISolone (SOLU-MEDROL) injection  80 mg Intravenous Q12H   Followed by   Derrill Memo ON 07/03/2021] methylPREDNISolone (SOLU-MEDROL) injection  40 mg Intravenous Q12H   multivitamin with minerals  1 tablet Per Tube Daily   oseltamivir  30 mg Per Tube BID   pantoprazole sodium  40 mg Per Tube Daily   sodium chloride flush  10-40 mL Intracatheter Q12H   sodium polystyrene  30 g Rectal Once   umeclidinium bromide  1 puff Inhalation Daily    Assessment: 70 y.o. male with elevated troponin/Afib for heparin. Had been on Xarelto in the past - per family has been off since 2020? for previous DVT.  Heparin level came back supratherapeutic at 0.96 on 1550 units/hr.   Given unclear history on DOAC use, collected aPTT which was also supratherapeutic - correlating with heparin level - will only monitor heparin level. Hgb 17, plt 80 (will monitor). No s/sx of bleeding or infusion issues.   11/8 AM update:  Heparin level still elevated but trending down   Goal of Therapy:  Heparin level 0.3-0.7 units/ml Monitor platelets by anticoagulation protocol: Yes   Plan:  Reduce heparin infusion to 1250 units/hr 1300 heparin level Monitor daily HL, CBC, and for s/sx of bleeding   Narda Bonds, PharmD, BCPS Clinical Pharmacist Phone: [phone]

## 2021-07-03 ENCOUNTER — Inpatient Hospital Stay (HOSPITAL_COMMUNITY): Payer: Medicare Other

## 2021-07-03 DIAGNOSIS — J9601 Acute respiratory failure with hypoxia: Secondary | ICD-10-CM | POA: Diagnosis not present

## 2021-07-03 LAB — GLUCOSE, CAPILLARY
Glucose-Capillary: 101 mg/dL — ABNORMAL HIGH (ref 70–99)
Glucose-Capillary: 136 mg/dL — ABNORMAL HIGH (ref 70–99)
Glucose-Capillary: 140 mg/dL — ABNORMAL HIGH (ref 70–99)
Glucose-Capillary: 172 mg/dL — ABNORMAL HIGH (ref 70–99)
Glucose-Capillary: 180 mg/dL — ABNORMAL HIGH (ref 70–99)
Glucose-Capillary: 235 mg/dL — ABNORMAL HIGH (ref 70–99)

## 2021-07-03 LAB — BASIC METABOLIC PANEL
Anion gap: 11 (ref 5–15)
BUN: 98 mg/dL — ABNORMAL HIGH (ref 8–23)
CO2: 26 mmol/L (ref 22–32)
Calcium: 8.3 mg/dL — ABNORMAL LOW (ref 8.9–10.3)
Chloride: 103 mmol/L (ref 98–111)
Creatinine, Ser: 2.43 mg/dL — ABNORMAL HIGH (ref 0.61–1.24)
GFR, Estimated: 28 mL/min — ABNORMAL LOW (ref >60)
Glucose, Bld: 188 mg/dL — ABNORMAL HIGH (ref 70–99)
Potassium: 4.9 mmol/L (ref 3.5–5.1)
Sodium: 140 mmol/L (ref 135–145)

## 2021-07-03 LAB — HEPARIN LEVEL (UNFRACTIONATED): Heparin Unfractionated: 0.49 IU/mL (ref 0.30–0.70)

## 2021-07-03 LAB — CBC
HCT: 49.2 % (ref 39.0–52.0)
Hemoglobin: 15.5 g/dL (ref 13.0–17.0)
MCH: 29.9 pg (ref 26.0–34.0)
MCHC: 31.5 g/dL (ref 30.0–36.0)
MCV: 94.8 fL (ref 80.0–100.0)
Platelets: 100 10*3/uL — ABNORMAL LOW (ref 150–400)
RBC: 5.19 MIL/uL (ref 4.22–5.81)
RDW: 14.1 % (ref 11.5–15.5)
WBC: 14 10*3/uL — ABNORMAL HIGH (ref 4.0–10.5)
nRBC: 0 % (ref 0.0–0.2)

## 2021-07-03 MED ORDER — OSELTAMIVIR PHOSPHATE 30 MG PO CAPS
30.0000 mg | ORAL_CAPSULE | Freq: Two times a day (BID) | ORAL | Status: DC
  Administered 2021-07-03: 30 mg via ORAL
  Filled 2021-07-03 (×2): qty 1

## 2021-07-03 MED ORDER — ADULT MULTIVITAMIN W/MINERALS CH
1.0000 | ORAL_TABLET | Freq: Every day | ORAL | Status: DC
  Administered 2021-07-04 – 2021-07-08 (×5): 1 via ORAL
  Filled 2021-07-03 (×5): qty 1

## 2021-07-03 MED ORDER — METHYLPREDNISOLONE SODIUM SUCC 40 MG IJ SOLR
40.0000 mg | INTRAMUSCULAR | Status: DC
  Administered 2021-07-03 – 2021-07-04 (×2): 40 mg via INTRAVENOUS
  Filled 2021-07-03: qty 1

## 2021-07-03 MED ORDER — ASPIRIN EC 81 MG PO TBEC
81.0000 mg | DELAYED_RELEASE_TABLET | Freq: Every day | ORAL | Status: DC
  Administered 2021-07-04 – 2021-07-08 (×5): 81 mg via ORAL
  Filled 2021-07-03 (×5): qty 1

## 2021-07-03 MED ORDER — PANTOPRAZOLE 2 MG/ML SUSPENSION
40.0000 mg | Freq: Every day | ORAL | Status: DC
  Administered 2021-07-04: 40 mg via ORAL
  Filled 2021-07-03: qty 20

## 2021-07-03 NOTE — Procedures (Signed)
Extubation Procedure Note  Patient Details:   Name: Manuel Leonard DOB: 08/23/1951 MRN: [id number]   Airway Documentation:    Vent end date: 07/03/21 Vent end time: 1020   Evaluation  O2 sats: transiently fell during during procedure Complications: No apparent complications Patient did tolerate procedure well. Bilateral Breath Sounds: Clear, Diminished   Yes  Patient had a positive cuff leak prior to extubation. Patient does not have any stridor and is able to speak. Patient placed on nasal cannula but transferred to high flow due to oxygen saturations in the high 80s.  Zachary Lovins A Tyne Banta 07/03/2021, 10:30 AM

## 2021-07-03 NOTE — Progress Notes (Signed)
RT placed patient on high flow nasal cannula after extubation due to patient's oxygen saturations in the high 80s on a typically nasal cannula. RT noticed patient his breath rather than taking deep breaths. Patient currently on 6L HFNC, tolerating well.

## 2021-07-03 NOTE — Progress Notes (Signed)
NAME:  Manuel Leonard, MRN:  [id number], DOB:  05-25-1951, LOS: 3 ADMISSION DATE:  06/30/2021, CONSULTATION DATE: 07/01/2016 REFERRING MD:  Dr. Gwendlyn Deutscher, CHIEF COMPLAINT: Shock  History of Present Illness:  Manuel Leonard is a 70 y.o. male with a past medical history significant for COPD, atrial fibrillation anticoagulated on Xarelto hypertension, hyperlipidemia, diabetes, and tobacco abuse who presented initially to the emergency department after family found patient unresponsive with right-sided gaze.  Last known normal 10 PM patient was also seen significantly hypoxic with oxygen saturation 70% on room air.  Code stroke called on admission.  On arrival patient was seen minimally responsive with temp 101.7, tachypnea, tachycardia, and minimal responsiveness.  Patient was placed on BiPAP with initial ABG; 7.26 /70.2 /450 /31.  Neurology was consulted and patient underwent stat Head CT which was negative for any acute findings.  Decreased mentation felt secondary to hypercapnia.  Patient was initially admitted under family medicine teaching service who felt patient's presentation was likely related to sepsis secondary to CAP, patient was also found Flu A positive while in the emergency department patient's lab work continued to decline including worsening renal function, elevated lactic acid, and elevated troponin.  This prompted PCCM consultation.  Pertinent  Medical History  COPD Hypertension Hyperlipidemia Diabetes Tobacco abuse  Significant Hospital Events: Including procedures, antibiotic start and stop dates in addition to other pertinent events   11/7 admitted after being found unresponsive 11/7 intubated by anesthesiology - easy intubation.  11/7 mild troponin rise with pseudo-infarct pattern on EKG with no regional wall motion abnormalities.  11/8 episodes of generalized tremulousness with associated desaturation. EEG negative. Seen by neurology, deemed essential  tremor  Interim History / Subjective:  Flexiseal for diarrhea placed overnight.   Objective   Blood pressure 114/64, pulse 89, temperature (!) 97.1 F (36.2 C), temperature source Axillary, resp. rate 15, height 5\' 11"  (1.803 m), weight 133.6 kg, SpO2 95 %.    Vent Mode: CPAP;PSV FiO2 (%):  [10 %-40 %] 40 % Set Rate:  [28 bmp] 28 bmp Vt Set:  [600 mL-610 mL] 610 mL PEEP:  [5 cmH20] 5 cmH20 Pressure Support:  [10 cmH20] 10 cmH20 Plateau Pressure:  [18 cmH20-26 cmH20] 22 cmH20   Intake/Output Summary (Last 24 hours) at 07/03/2021 0830 Last data filed at 07/03/2021 0700 Gross per 24 hour  Intake 2195.97 ml  Output 1605 ml  Net 590.97 ml   Filed Weights   07/01/21 0545 07/02/21 0448 07/03/21 0500  Weight: (!) 142.3 kg (!) 143.3 kg 133.6 kg    Examination: General: Morbidly obese man  HEENT: orally intubated, OGT in place with no tracheal deviation.  Neuro: awake and calm but and  follows commands. No tremor. Normal strength.  CV: Irregularly irregular rate, s1s2 regular rate and rhythm, no murmur, rubs, or gallops,  extremities warm.  PULM: Diminished breath sounds bilaterally with no wheezing.  Tolerating SBT 5/5 with SBI 20's.  Normal ET CO2 GI: soft, Extremities: warm/dry, generalized nonpitting edema  Skin: no rashes or lesions  Ancillary tests  CXR personally reviewed and only mild interstitial pattern ABG show respiratory acidosis improved from yesterday Cultures are negative.  Creatinine has leveled off at 2.42 but still up from baseline.  Modest BNP and Troponin elevation both less than 500 EKG show new 29mm ST elevation in inferior leads but no inferior WMA on echo.   Assessment & Plan:  Critically ill due to distributive shock due to severe influenza A  Critically ill due to Acute  Hypoxic and hypercapnic Respiratory Failure Due to Influenza A pneumonia History of COPD Tobacco abuse Acute metabolic encephalopathy Hyperkalemia due to Acute kidney  injury Demand-related ischemia History of DVT anticoagulated on Xarelto Transaminitis, possible shock liver vs. NAFLD Thrombocytopenia due to shock  Type 2 diabetes  with hyperglycemia from steroids.  Essential tremor.  Principal problem is flu pneumonia on background of COPD and obesity.  Appears ready for extubation today.   Plan:  - SBT for 2h and extubate - Continue CAP coverage for 7 days - Tamiflu for 5 days.   Best Practice (right click and "Reselect all SmartList Selections" daily)   Diet/type: NPO will start tube feeds./  DVT prophylaxis: systemic heparin GI prophylaxis: PPI Lines: N/A needs CVC Foley:  N/A Code Status:  full code Last date of multidisciplinary goals of care discussion: brother and daughter updated at the bedside.   Labs   CBC: Recent Labs  Lab 06/30/21 1506 06/30/21 1528 07/01/21 0202 07/01/21 0240 07/01/21 0902 07/01/21 0939 07/01/21 2056 07/02/21 0136 07/02/21 0334 07/02/21 1127 07/03/21 0419  WBC 9.8  --  8.0  --  12.0*  --   --   --  19.4*  --  14.0*  NEUTROABS 7.9*  --   --   --  10.5*  --   --   --   --   --   --   HGB 17.4*   < > 16.9   < > 16.7   < > 16.3 17.0 15.9 17.0 15.5  HCT 56.0*   < > 55.6*   < > 53.6*   < > 48.0 50.0 50.5 50.0 49.2  MCV 95.7  --  97.5  --  97.1  --   --   --  94.4  --  94.8  PLT 97*  --  80*  --  135*  --   --   --  132*  --  100*   < > = values in this interval not displayed.     Basic Metabolic Panel: Recent Labs  Lab 06/30/21 1844 07/01/21 0037 07/01/21 0240 07/01/21 0902 07/01/21 0939 07/01/21 1850 07/01/21 2056 07/02/21 0010 07/02/21 0136 07/02/21 0334 07/02/21 1127 07/03/21 0419  NA 137 136   < > 135   < >  --  137 136 139  --  139 140  K 5.4* 5.4*   < > 6.4*   < >  --  4.5 4.7 4.8  --  4.6 4.9  CL 100 103  --  100  --   --   --  100  --   --   --  103  CO2 24 21*  --  26  --   --   --  24  --   --   --  26  GLUCOSE 268* 262*  --  349*  --   --   --  164*  --   --   --  188*  BUN  33* 34*  --  42*  --   --   --  64*  --   --   --  98*  CREATININE 2.26* 1.90*  --  2.17*  --   --   --  2.49*  --   --   --  2.43*  CALCIUM 8.4* 7.9*  --  8.1*  --   --   --  8.3*  --   --   --  8.3*  MG  --  2.6*  --  2.5*  --  2.6*  --  2.7*  --  2.6*  --   --   PHOS  --   --   --  4.1  --  4.8*  --   --   --  3.7  --   --    < > = values in this interval not displayed.    GFR: Estimated Creatinine Clearance: 40 mL/min (A) (by C-G formula based on SCr of 2.43 mg/dL (H)). Recent Labs  Lab 06/30/21 1750 06/30/21 2149 07/01/21 0037 07/01/21 0202 07/01/21 0902 07/02/21 0334 07/03/21 0419  PROCALCITON  --   --  4.08  --   --  5.60  --   WBC  --   --   --  8.0 12.0* 19.4* 14.0*  LATICACIDVEN 2.2* 2.6* 1.6  --   --   --   --      Liver Function Tests: Recent Labs  Lab 06/30/21 1506  AST 67*  ALT 50*  ALKPHOS 79  BILITOT 1.7*  PROT 7.1  ALBUMIN 3.8    No results for input(s): LIPASE, AMYLASE in the last 168 hours. No results for input(s): AMMONIA in the last 168 hours.  ABG    Component Value Date/Time   PHART 7.270 (L) 07/02/2021 1127   PCO2ART 59.5 (H) 07/02/2021 1127   PO2ART 63 (L) 07/02/2021 1127   HCO3 27.3 07/02/2021 1127   TCO2 29 07/02/2021 1127   ACIDBASEDEF 1.0 07/02/2021 1127   O2SAT 88.0 07/02/2021 1127      Coagulation Profile: Recent Labs  Lab 06/30/21 1723  INR 1.1     Cardiac Enzymes: Cardiac Panel (last 3 results) Recent Labs    07/01/21 0206 07/02/21 0334 07/02/21 0553  CKTOTAL  --  370  --   CKMB  --  8.5*  --   TROPONINIHS 345* 476* 438*  RELINDX  --  2.3  --       HbA1C: Hgb A1c MFr Bld  Date/Time Value Ref Range Status  06/30/2021 05:50 PM 6.7 (H) 4.8 - 5.6 % Final    Comment:    (NOTE) Pre diabetes:          5.7%-6.4%  Diabetes:              >6.4%  Glycemic control for   <7.0% adults with diabetes     CBG: Recent Labs  Lab 07/02/21 1605 07/02/21 1722 07/02/21 2011 07/03/21 0001 07/03/21 0418  GLUCAP  114* 147* 233* 235* 180*     Critical care time:   CRITICAL CARE Performed by: Kipp Brood  Total critical care time: 40 minutes  Critical care time was exclusive of separately billable procedures and treating other patients.  Critical care was necessary to treat or prevent imminent or life-threatening deterioration.  Critical care was time spent personally by me on the following activities: development of treatment plan with patient and/or surrogate as well as nursing, discussions with consultants, evaluation of patient's response to treatment, examination of patient, obtaining history from patient or surrogate, ordering and performing treatments and interventions, ordering and review of laboratory studies, ordering and review of radiographic studies, pulse oximetry and re-evaluation of patient's condition.  Kipp Brood, MD [facility] ICU Physician [place]  Pager: [phone] Or Epic Secure Chat After hours: [phone].  07/03/2021, 8:27 AM

## 2021-07-04 ENCOUNTER — Other Ambulatory Visit (HOSPITAL_COMMUNITY): Payer: Self-pay

## 2021-07-04 DIAGNOSIS — R0902 Hypoxemia: Secondary | ICD-10-CM | POA: Diagnosis not present

## 2021-07-04 DIAGNOSIS — R0689 Other abnormalities of breathing: Secondary | ICD-10-CM | POA: Diagnosis not present

## 2021-07-04 LAB — CBC
HCT: 51 % (ref 39.0–52.0)
Hemoglobin: 15.8 g/dL (ref 13.0–17.0)
MCH: 29.7 pg (ref 26.0–34.0)
MCHC: 31 g/dL (ref 30.0–36.0)
MCV: 95.9 fL (ref 80.0–100.0)
Platelets: 110 10*3/uL — ABNORMAL LOW (ref 150–400)
RBC: 5.32 MIL/uL (ref 4.22–5.81)
RDW: 14.2 % (ref 11.5–15.5)
WBC: 11.5 10*3/uL — ABNORMAL HIGH (ref 4.0–10.5)
nRBC: 0 % (ref 0.0–0.2)

## 2021-07-04 LAB — BASIC METABOLIC PANEL
Anion gap: 8 (ref 5–15)
BUN: 78 mg/dL — ABNORMAL HIGH (ref 8–23)
CO2: 31 mmol/L (ref 22–32)
Calcium: 8.6 mg/dL — ABNORMAL LOW (ref 8.9–10.3)
Chloride: 107 mmol/L (ref 98–111)
Creatinine, Ser: 1.56 mg/dL — ABNORMAL HIGH (ref 0.61–1.24)
GFR, Estimated: 48 mL/min — ABNORMAL LOW (ref >60)
Glucose, Bld: 104 mg/dL — ABNORMAL HIGH (ref 70–99)
Potassium: 4.9 mmol/L (ref 3.5–5.1)
Sodium: 146 mmol/L — ABNORMAL HIGH (ref 135–145)

## 2021-07-04 LAB — GLUCOSE, CAPILLARY
Glucose-Capillary: 103 mg/dL — ABNORMAL HIGH (ref 70–99)
Glucose-Capillary: 110 mg/dL — ABNORMAL HIGH (ref 70–99)
Glucose-Capillary: 112 mg/dL — ABNORMAL HIGH (ref 70–99)
Glucose-Capillary: 120 mg/dL — ABNORMAL HIGH (ref 70–99)
Glucose-Capillary: 162 mg/dL — ABNORMAL HIGH (ref 70–99)
Glucose-Capillary: 196 mg/dL — ABNORMAL HIGH (ref 70–99)

## 2021-07-04 LAB — HEPARIN LEVEL (UNFRACTIONATED): Heparin Unfractionated: 0.45 IU/mL (ref 0.30–0.70)

## 2021-07-04 MED ORDER — BUPROPION HCL ER (SR) 150 MG PO TB12
150.0000 mg | ORAL_TABLET | Freq: Every day | ORAL | Status: AC
  Administered 2021-07-04 – 2021-07-06 (×3): 150 mg via ORAL
  Filled 2021-07-04 (×3): qty 1

## 2021-07-04 MED ORDER — METFORMIN HCL 500 MG PO TABS
500.0000 mg | ORAL_TABLET | Freq: Two times a day (BID) | ORAL | Status: DC
  Administered 2021-07-04 – 2021-07-08 (×8): 500 mg via ORAL
  Filled 2021-07-04 (×8): qty 1

## 2021-07-04 MED ORDER — RIVAROXABAN 20 MG PO TABS
20.0000 mg | ORAL_TABLET | Freq: Every day | ORAL | Status: DC
  Administered 2021-07-04: 20 mg via ORAL
  Filled 2021-07-04: qty 1

## 2021-07-04 MED ORDER — BOOST / RESOURCE BREEZE PO LIQD CUSTOM
1.0000 | Freq: Three times a day (TID) | ORAL | Status: DC
  Administered 2021-07-04 – 2021-07-07 (×7): 1 via ORAL

## 2021-07-04 MED ORDER — INSULIN ASPART 100 UNIT/ML IJ SOLN: 0.0000 [IU] | Freq: Every day | INTRAMUSCULAR | Status: DC

## 2021-07-04 MED ORDER — OSELTAMIVIR PHOSPHATE 75 MG PO CAPS
75.0000 mg | ORAL_CAPSULE | Freq: Two times a day (BID) | ORAL | Status: AC
  Administered 2021-07-04 – 2021-07-05 (×3): 75 mg via ORAL
  Filled 2021-07-04 (×3): qty 1

## 2021-07-04 MED ORDER — INSULIN ASPART 100 UNIT/ML IJ SOLN
0.0000 [IU] | Freq: Three times a day (TID) | INTRAMUSCULAR | Status: DC
  Administered 2021-07-04: 4 [IU] via SUBCUTANEOUS

## 2021-07-04 MED ORDER — LIP MEDEX EX OINT
TOPICAL_OINTMENT | CUTANEOUS | Status: DC | PRN
  Filled 2021-07-04: qty 7

## 2021-07-04 MED ORDER — BUPROPION HCL ER (SR) 150 MG PO TB12
150.0000 mg | ORAL_TABLET | Freq: Two times a day (BID) | ORAL | Status: DC
  Administered 2021-07-07 – 2021-07-08 (×3): 150 mg via ORAL
  Filled 2021-07-04 (×4): qty 1

## 2021-07-04 MED ORDER — PROSOURCE PLUS PO LIQD
30.0000 mL | Freq: Three times a day (TID) | ORAL | Status: DC
  Administered 2021-07-05 – 2021-07-07 (×7): 30 mL via ORAL
  Filled 2021-07-04 (×10): qty 30

## 2021-07-04 MED ORDER — PANTOPRAZOLE SODIUM 40 MG PO TBEC: 40.0000 mg | DELAYED_RELEASE_TABLET | Freq: Every day | ORAL | Status: DC

## 2021-07-04 MED ORDER — INSULIN DETEMIR 100 UNIT/ML ~~LOC~~ SOLN
15.0000 [IU] | Freq: Two times a day (BID) | SUBCUTANEOUS | Status: DC
  Administered 2021-07-04 – 2021-07-05 (×2): 15 [IU] via SUBCUTANEOUS
  Filled 2021-07-04 (×3): qty 0.15

## 2021-07-04 NOTE — Plan of Care (Signed)
  Problem: Clinical Measurements: Goal: Ability to maintain clinical measurements within normal limits will improve Outcome: Progressing Goal: Respiratory complications will improve Outcome: Progressing   Problem: Elimination: Goal: Will not experience complications related to urinary retention Outcome: Progressing   Problem: Pain Managment: Goal: General experience of comfort will improve Outcome: Progressing

## 2021-07-04 NOTE — TOC Benefit Eligibility Note (Addendum)
Patient Teacher, English as a foreign language completed.    The patient is currently admitted and upon discharge could be taking Xarelto 20 mg.  The current 30 day co-pay is, $137.97. Due to patient being in Coverage Gap (donut hole)  The patient is currently admitted and upon discharge could be taking Eliquis 5 mg.  Product/Service Not Covered  The patient is insured through [employer] [place] Patient Advocate Specialist [place] Patient Advocate Team Direct Number: [phone]  Fax: [phone]

## 2021-07-04 NOTE — Progress Notes (Signed)
Nutrition Follow-up  DOCUMENTATION CODES:   Not applicable  INTERVENTION:   -Boost Breeze po TID, each supplement provides 250 kcal and 9 grams of protein  -30 ml Prosource Plus TID, each supplement provides 100 kcals and 15 grams -MVI with minerals daily -RD will follow for diet advancement and adjust supplement regimen as appropriate  NUTRITION DIAGNOSIS:   Inadequate oral intake related to inability to eat as evidenced by NPO status.  Progressing; advanced to clear liquids on 07/03/21  GOAL:   Patient will meet greater than or equal to 90% of their needs  Progressing  MONITOR:   PO intake, Supplement acceptance, Diet advancement, Labs, Weight trends, Skin, I & O's  REASON FOR ASSESSMENT:   Ventilator, Consult Enteral/tube feeding initiation and management  ASSESSMENT:   70 yo male admitted with sepsis, CAP, Flu A positive. PMH includes COPD, A fib, HTN, HLD, DM, tobacco abuse.  11/7- intubated 11/8- continuous EEG for seizure like activity, neurology deemed essential tremor 11/9- extubated, advanced to clear liquid diet  Reviewed I/O's: -1.5 L x 24 hours and +565 ml since admission  UOP: 2.4 L x 24 hours   Case discussed with RN, who reports pt is tolerating clear liquids well. MD has not rounded on pt yet, so unsure of plan for diet advancement.   Medications reviewed and include solu-medrol.   Labs reviewed: Na: 146, CBGS: 103-140 (inpatient orders for glycemic control are 3-9 units insulin aspart every 4 hours and 20 units insulin detemir every 12 hours).    Diet Order:   Diet Order             Diet clear liquid Room service appropriate? Yes; Fluid consistency: Thin  Diet effective now                   EDUCATION NEEDS:   Not appropriate for education at this time  Skin:  Skin Assessment: Reviewed RN Assessment  Last BM:  07/04/21  Height:   Ht Readings from Last 1 Encounters:  06/30/21 5\' 11"  (1.803 m)    Weight:   Wt Readings  from Last 1 Encounters:  07/04/21 (!) 140.6 kg   BMI:  Body mass index is 43.23 kg/m.  Estimated Nutritional Needs:   Kcal:  2150-2350  Protein:  120-135 grams  Fluid:  > 2 L    Loistine Chance, RD, LDN, [place] Registered Dietitian II Certified Diabetes Care and Education Specialist Please refer to [facility] for RD and/or RD on-call/weekend/after hours pager

## 2021-07-04 NOTE — Progress Notes (Signed)
ANTICOAGULATION CONSULT NOTE  Pharmacy Consult for Heparin Indication: atrial fibrillation  No Known Allergies  Patient Measurements: Height: 5' 11" (180.3 cm) Weight: (!) 140.6 kg (309 lb 15.5 oz) IBW/kg (Calculated) : 75.3 Heparin Dosing Weight: 110 kg  Vital Signs: Temp: 97.8 F (36.6 C) (11/10 0738) Temp Source: Oral (11/10 0738) BP: 138/79 (11/10 0700) Pulse Rate: 89 (11/10 0700)  Labs: Recent Labs    07/01/21 0914 07/01/21 0939 07/02/21 0010 07/02/21 0136 07/02/21 0334 07/02/21 0553 07/02/21 1127 07/02/21 1245 07/03/21 0419 07/04/21 0419  HGB  --    < >  --    < > 15.9  --  17.0  --  15.5 15.8  HCT  --    < >  --    < > 50.5  --  50.0  --  49.2 51.0  PLT  --   --   --   --  132*  --   --   --  100* 110*  APTT 116*  --   --   --   --   --   --   --   --   --   HEPARINUNFRC  --    < >  --   --  0.84*  --   --  0.61 0.49 0.45  CREATININE  --   --  2.49*  --   --   --   --   --  2.43* 1.56*  CKTOTAL  --   --   --   --  370  --   --   --   --   --   CKMB  --   --   --   --  8.5*  --   --   --   --   --   TROPONINIHS  --   --   --   --  476* 438*  --   --   --   --    < > = values in this interval not displayed.     Estimated Creatinine Clearance: 64.1 mL/min (A) (by C-G formula based on SCr of 1.56 mg/dL (H)).   Medical History: Past Medical History:  Diagnosis Date   Abnormal EKG 12/03/2017   Acute bronchitis 12/29/2016   Adenomatous polyps    Chest discomfort 12/03/2017   COPD (chronic obstructive pulmonary disease) (HCC)    Diabetes mellitus (HCC) 12/03/2017   Emphysema/COPD (HCC)    GERD (gastroesophageal reflux disease)    Hyperlipidemia 12/21/2015   Hypertension    not on meds    Leukocytosis 09/27/2014   Multiple pulmonary nodules    Pneumonia    hx of 2017    Sepsis (HCC) 09/27/2014   Tobacco abuse    Umbilical hernia 10/20/2016    Medications:  Scheduled:   aspirin EC  81 mg Oral Daily   chlorhexidine gluconate (MEDLINE KIT)  15 mL Mouth Rinse  BID   Chlorhexidine Gluconate Cloth  6 each Topical Daily   insulin aspart  3-9 Units Subcutaneous Q4H   insulin detemir  20 Units Subcutaneous Q12H   methylPREDNISolone (SOLU-MEDROL) injection  40 mg Intravenous Q24H   multivitamin with minerals  1 tablet Oral Daily   oseltamivir  75 mg Oral BID   pantoprazole sodium  40 mg Oral Daily   sodium chloride flush  10-40 mL Intracatheter Q12H   umeclidinium bromide  1 puff Inhalation Daily    Assessment: 70 y.o. male with elevated troponin/Afib for heparin. Had   been on Xarelto in the past - per family has been off since 2020? for previous DVT. He was extubated 11/9  Heparin level therapeutic at 0.45 on 1250 units/hr.    Goal of Therapy:  Heparin level 0.3-0.7 units/ml Monitor platelets by anticoagulation protocol: Yes   Plan:  Continue heparin at 1250 uinits/hr Monitor daily HL, CBC, and for s/sx of bleeding  Will watch plans to resume oral anticoagulation  Andrew Meyer, PharmD Clinical Pharmacist **Pharmacist phone directory can now be found on [email or web address] ([email or web address]).  Listed under [facility].      

## 2021-07-04 NOTE — Progress Notes (Signed)
NAME:  Manuel Leonard, MRN:  [id number], DOB:  06-Oct-1950, LOS: 4 ADMISSION DATE:  06/30/2021, CONSULTATION DATE: 07/01/2016 REFERRING MD:  Dr. Gwendlyn Deutscher, CHIEF COMPLAINT: Shock  History of Present Illness:  Manuel Leonard is a 70 y.o. male with a past medical history significant for COPD, atrial fibrillation anticoagulated on Xarelto hypertension, hyperlipidemia, diabetes, and tobacco abuse who presented initially to the emergency department after family found patient unresponsive with right-sided gaze.  Last known normal 10 PM patient was also seen significantly hypoxic with oxygen saturation 70% on room air.  Code stroke called on admission.  On arrival patient was seen minimally responsive with temp 101.7, tachypnea, tachycardia, and minimal responsiveness.  Patient was placed on BiPAP with initial ABG; 7.26 /70.2 /450 /31.  Neurology was consulted and patient underwent stat Head CT which was negative for any acute findings.  Decreased mentation felt secondary to hypercapnia.  Patient was initially admitted under family medicine teaching service who felt patient's presentation was likely related to sepsis secondary to CAP, patient was also found Flu A positive while in the emergency department patient's lab work continued to decline including worsening renal function, elevated lactic acid, and elevated troponin.  This prompted PCCM consultation.  Pertinent  Medical History  COPD Hypertension Hyperlipidemia Diabetes Tobacco abuse  Significant Hospital Events: Including procedures, antibiotic start and stop dates in addition to other pertinent events   11/7 admitted after being found unresponsive 11/7 intubated by anesthesiology - easy intubation.  11/7 mild troponin rise with pseudo-infarct pattern on EKG with no regional wall motion abnormalities.  11/8 episodes of generalized tremulousness with associated desaturation. EEG negative. Seen by neurology, deemed essential tremor 11/9  extubated   Interim History / Subjective:  Extubated yesterday.   Objective   Blood pressure 138/79, pulse 89, temperature 97.8 F (36.6 C), temperature source Oral, resp. rate (!) 28, height 5\' 11"  (1.803 m), weight (!) 140.6 kg, SpO2 97 %.        Intake/Output Summary (Last 24 hours) at 07/04/2021 0946 Last data filed at 07/04/2021 0755 Gross per 24 hour  Intake 885.96 ml  Output 2555 ml  Net -1669.04 ml    Filed Weights   07/02/21 0448 07/03/21 0500 07/04/21 0500  Weight: (!) 143.3 kg 133.6 kg (!) 140.6 kg    Examination: General: Morbidly obese man  HEENT: extubated Neuro: intact.  CV: Irregularly irregular rate, s1s2 regular rate and rhythm, no murmur, rubs, or gallops,  extremities warm.  PULM: Diminished breath sounds bilaterally with no wheezing.   GI: soft, non-tender Extremities: warm/dry, generalized nonpitting edema  Skin: no rashes or lesions  Ancillary tests  CXR personally reviewed and only mild interstitial pattern ABG show respiratory acidosis improved from yesterday Cultures are negative.  Creatinine has leveled off at 2.42 but still up from baseline.  Modest BNP and Troponin elevation both less than 500 EKG show new 53mm ST elevation in inferior leads but no inferior WMA on echo.   Assessment & Plan:  Critically ill due to distributive shock due to severe influenza A  Critically ill due to Acute Hypoxic and hypercapnic Respiratory Failure Due to Influenza A pneumonia History of COPD Tobacco abuse Acute metabolic encephalopathy Hyperkalemia due to Acute kidney injury Demand-related ischemia History of DVT anticoagulated on Xarelto Transaminitis, possible shock liver vs. NAFLD Thrombocytopenia due to shock  Type 2 diabetes  with hyperglycemia from steroids.  Essential tremor.  Principal problem is flu pneumonia on background of COPD and obesity.  Ready for transfer  Plan:  - Remove all lines - Transition to home meds - Progressive  ambulation.  - Needs CPAP, PSG  and likely pulmonary rehabilitation.  - Continue CAP coverage for 7 days - Tamiflu for 5 days.   TRH contacted and orders reconciled.   Best Practice (right click and "Reselect all SmartList Selections" daily)   Diet/type: Regular diet.  DVT prophylaxis: systemic heparin GI prophylaxis: PPI Lines: N/A needs CVC Foley:  N/A Code Status:  full code Last date of multidisciplinary goals of care discussion: brother and daughter updated at the bedside.   Labs   CBC: Recent Labs  Lab 06/30/21 1506 06/30/21 1528 07/01/21 0202 07/01/21 0240 07/01/21 0902 07/01/21 0939 07/02/21 0136 07/02/21 0334 07/02/21 1127 07/03/21 0419 07/04/21 0419  WBC 9.8  --  8.0  --  12.0*  --   --  19.4*  --  14.0* 11.5*  NEUTROABS 7.9*  --   --   --  10.5*  --   --   --   --   --   --   HGB 17.4*   < > 16.9   < > 16.7   < > 17.0 15.9 17.0 15.5 15.8  HCT 56.0*   < > 55.6*   < > 53.6*   < > 50.0 50.5 50.0 49.2 51.0  MCV 95.7  --  97.5  --  97.1  --   --  94.4  --  94.8 95.9  PLT 97*  --  80*  --  135*  --   --  132*  --  100* 110*   < > = values in this interval not displayed.     Basic Metabolic Panel: Recent Labs  Lab 07/01/21 0037 07/01/21 0240 07/01/21 0902 07/01/21 0939 07/01/21 1850 07/01/21 2056 07/02/21 0010 07/02/21 0136 07/02/21 0334 07/02/21 1127 07/03/21 0419 07/04/21 0419  NA 136   < > 135   < >  --    < > 136 139  --  139 140 146*  K 5.4*   < > 6.4*   < >  --    < > 4.7 4.8  --  4.6 4.9 4.9  CL 103  --  100  --   --   --  100  --   --   --  103 107  CO2 21*  --  26  --   --   --  24  --   --   --  26 31  GLUCOSE 262*  --  349*  --   --   --  164*  --   --   --  188* 104*  BUN 34*  --  42*  --   --   --  64*  --   --   --  98* 78*  CREATININE 1.90*  --  2.17*  --   --   --  2.49*  --   --   --  2.43* 1.56*  CALCIUM 7.9*  --  8.1*  --   --   --  8.3*  --   --   --  8.3* 8.6*  MG 2.6*  --  2.5*  --  2.6*  --  2.7*  --  2.6*  --   --   --    PHOS  --   --  4.1  --  4.8*  --   --   --  3.7  --   --   --    < > =  values in this interval not displayed.    GFR: Estimated Creatinine Clearance: 64.1 mL/min (A) (by C-G formula based on SCr of 1.56 mg/dL (H)). Recent Labs  Lab 06/30/21 1750 06/30/21 2149 07/01/21 0037 07/01/21 0202 07/01/21 0902 07/02/21 0334 07/03/21 0419 07/04/21 0419  PROCALCITON  --   --  4.08  --   --  5.60  --   --   WBC  --   --   --    < > 12.0* 19.4* 14.0* 11.5*  LATICACIDVEN 2.2* 2.6* 1.6  --   --   --   --   --    < > = values in this interval not displayed.     Liver Function Tests: Recent Labs  Lab 06/30/21 1506  AST 67*  ALT 50*  ALKPHOS 79  BILITOT 1.7*  PROT 7.1  ALBUMIN 3.8    No results for input(s): LIPASE, AMYLASE in the last 168 hours. No results for input(s): AMMONIA in the last 168 hours.  ABG    Component Value Date/Time   PHART 7.270 (L) 07/02/2021 1127   PCO2ART 59.5 (H) 07/02/2021 1127   PO2ART 63 (L) 07/02/2021 1127   HCO3 27.3 07/02/2021 1127   TCO2 29 07/02/2021 1127   ACIDBASEDEF 1.0 07/02/2021 1127   O2SAT 88.0 07/02/2021 1127      Coagulation Profile: Recent Labs  Lab 06/30/21 1723  INR 1.1     Cardiac Enzymes: Cardiac Panel (last 3 results) Recent Labs    07/02/21 0334 07/02/21 0553  CKTOTAL 370  --   CKMB 8.5*  --   TROPONINIHS 476* 438*  RELINDX 2.3  --       HbA1C: Hgb A1c MFr Bld  Date/Time Value Ref Range Status  06/30/2021 05:50 PM 6.7 (H) 4.8 - 5.6 % Final    Comment:    (NOTE) Pre diabetes:          5.7%-6.4%  Diabetes:              >6.4%  Glycemic control for   <7.0% adults with diabetes     CBG: Recent Labs  Lab 07/03/21 1626 07/03/21 2011 07/04/21 0052 07/04/21 0424 07/04/21 0647  GLUCAP 101* 140* 112* 110* 103*   35 min spent with >50 % in counseling and coordination of care.   Kipp Brood, MD [facility] ICU Physician [place]  Pager: [phone] Or Epic Secure Chat After hours:  [phone].  07/04/2021, 9:46 AM

## 2021-07-04 NOTE — Progress Notes (Signed)
RT NOTE:  Pt refuses CPAP tonight. RT available if patient changes mind.

## 2021-07-04 NOTE — Progress Notes (Addendum)
FPTS Brief Progress Note  Patient will be coming out to the family medicine teaching service today, 11/11, at 0700. This note serves as an overnight review of orders and summary of ICU stay.   Event overview: 11/06  Admitted by FPTS early evening 11/07  0630 Admitted to ICU early AM, intubated   0425-0830 precedex infusion  Start levophed, propofol, insulin, fentanyl infusion 11/08  0030 Seizure-like episode with desaturation and bigeminy on EKG  0230 Intermittent arrhythmias   0300 Lethargic but responding to commands   0830-1200 EEG: diffuse encephalopathy w/o seizures or epileptiform changes  Neuro believes tremulousness to be essential tremor   Stop insulin infusion 11/09  Stop levophed, proprofol, fentanyl infusion 1030 Extubated  11/10 Lines removed, home meds restarted, start CPAP 11/11  0700 Out to floor with FPTS  Anti-infective overview: Azithromycin 11/06-08 Cefepime 11/06 Ceftriaxone 11/07-now Tamiflu 11/06-11  Acute Hypercarbic Respiratory Failure  COPD  Influenza A S/p intubation x 2 days. Extubated 11/09. Now normal SpO2 on 5L. On admission, FPTS started duonebs and azithromycin out of concern for COPD exacerbation. Patient found to be incidentally positive for influenza A despite no respiratory complaints preceding ED admission. ABG demonstrated pH 7.265/pCO2 71.6>70.2/pO2 297>450/bicarb 30.6>31.9 which initially worsened with BiPAP. Once admitted to ICU, was persistently hypercarbic despite solumedrol, duonebs, increasing inspiratory pressures on BiPAP. Intubated. ICU provided CAP coverage and tamiflu x5 days. Blood cultures no growth 4 days.   Altered mental status  metabolic encephalopathy Initial concern for stroke when patient arrived to ED via EMS. CT head and CTA head/neck negative for acute CVA or intracranial bleed. US abdomen for distended taut abdomen negative for acute process. HIV negative. TSH wnl. A1c 6.7. Blood smear demonstrated neutrophilia due to  inflammatory infection, no blasts seen, large platelets present. Fibrinogen 558.    Influenza A  Meets Sepsis Criteria   Incidentally on admission. No respiratory complaints prior to ED presentation per family. Lactic acid initially 2.2, rose to 2.6 despite fluid resuscitation. In ICU, received large volume fluids, LA resolved. S/p Tamiflu x5 days.    Hyperkalemia On admission, K+ 5.6 with peaked T waves on EKG. Received calcium gluconate IVPB x1, lokelma 10 mg x1. First day in ICU, K+ 5.4; patient then received kayexalate 30 g PR with good response. Potassium slowly rising subsequent days but still within the normal range. Next check this morning.    AKI Baseline creatinine thought to be 1.0-1.4. On admission, was 1.67 and rose to 2.26 within matter of several hours. AKI slowly improved, and is now 1.56 on day prior to transfer out to [facility].    NSTEMI On admission, 198>461. EKG without ST elevation. Felt to be demand ischemia due to septic shock. ECHO demonstrated EF 50-55% with low-noral LV function, mildly enlarged RV, and mild-to-moderate aortic valve sclerosis.     - Orders reviewed. Labs for AM ordered, which was adjusted as needed.  - PT/OT eval and treat to start 11/11 AM - continue with dietician recommendations - appreciate neurology recommendations   Ezequiel Essex, MD 07/05/2021, 5:20 AM PGY-2, [facility] Night Resident  Please page [phone] with questions.

## 2021-07-05 ENCOUNTER — Telehealth: Payer: Self-pay | Admitting: Physician Assistant

## 2021-07-05 DIAGNOSIS — R0902 Hypoxemia: Secondary | ICD-10-CM

## 2021-07-05 DIAGNOSIS — J441 Chronic obstructive pulmonary disease with (acute) exacerbation: Secondary | ICD-10-CM

## 2021-07-05 DIAGNOSIS — R0689 Other abnormalities of breathing: Secondary | ICD-10-CM | POA: Diagnosis not present

## 2021-07-05 DIAGNOSIS — R4182 Altered mental status, unspecified: Secondary | ICD-10-CM | POA: Diagnosis not present

## 2021-07-05 LAB — CULTURE, BLOOD (ROUTINE X 2)
Culture: NO GROWTH
Culture: NO GROWTH

## 2021-07-05 LAB — BASIC METABOLIC PANEL
Anion gap: 8 (ref 5–15)
BUN: 60 mg/dL — ABNORMAL HIGH (ref 8–23)
CO2: 29 mmol/L (ref 22–32)
Calcium: 8.7 mg/dL — ABNORMAL LOW (ref 8.9–10.3)
Chloride: 107 mmol/L (ref 98–111)
Creatinine, Ser: 1.2 mg/dL (ref 0.61–1.24)
GFR, Estimated: >60 mL/min (ref >60)
Glucose, Bld: 133 mg/dL — ABNORMAL HIGH (ref 70–99)
Potassium: 5.2 mmol/L — ABNORMAL HIGH (ref 3.5–5.1)
Sodium: 144 mmol/L (ref 135–145)

## 2021-07-05 LAB — CBC
HCT: 55.7 % — ABNORMAL HIGH (ref 39.0–52.0)
Hemoglobin: 17 g/dL (ref 13.0–17.0)
MCH: 29.8 pg (ref 26.0–34.0)
MCHC: 30.5 g/dL (ref 30.0–36.0)
MCV: 97.5 fL (ref 80.0–100.0)
Platelets: 114 10*3/uL — ABNORMAL LOW (ref 150–400)
RBC: 5.71 MIL/uL (ref 4.22–5.81)
RDW: 14.1 % (ref 11.5–15.5)
WBC: 9.7 10*3/uL (ref 4.0–10.5)
nRBC: 0 % (ref 0.0–0.2)

## 2021-07-05 LAB — PROCALCITONIN: Procalcitonin: 0.24 ng/mL

## 2021-07-05 LAB — GLUCOSE, CAPILLARY
Glucose-Capillary: 118 mg/dL — ABNORMAL HIGH (ref 70–99)
Glucose-Capillary: 140 mg/dL — ABNORMAL HIGH (ref 70–99)
Glucose-Capillary: 209 mg/dL — ABNORMAL HIGH (ref 70–99)
Glucose-Capillary: 106 mg/dL — ABNORMAL HIGH (ref 70–99)

## 2021-07-05 MED ORDER — INSULIN DETEMIR 100 UNIT/ML ~~LOC~~ SOLN
10.0000 [IU] | Freq: Every day | SUBCUTANEOUS | Status: DC
Start: 2021-07-05 — End: 2021-07-06
  Administered 2021-07-05: 10 [IU] via SUBCUTANEOUS
  Filled 2021-07-05 (×2): qty 0.1

## 2021-07-05 MED ORDER — INSULIN DETEMIR 100 UNIT/ML ~~LOC~~ SOLN: 10.0000 [IU] | Freq: Two times a day (BID) | SUBCUTANEOUS | Status: DC

## 2021-07-05 MED ORDER — ENOXAPARIN SODIUM 80 MG/0.8ML IJ SOSY
0.5000 mg/kg | PREFILLED_SYRINGE | INTRAMUSCULAR | Status: DC
  Administered 2021-07-05 – 2021-07-08 (×4): 70 mg via SUBCUTANEOUS
  Filled 2021-07-05 (×4): qty 0.7

## 2021-07-05 MED ORDER — CEFDINIR 300 MG PO CAPS
300.0000 mg | ORAL_CAPSULE | Freq: Two times a day (BID) | ORAL | Status: AC
  Administered 2021-07-06 – 2021-07-07 (×4): 300 mg via ORAL
  Filled 2021-07-05 (×4): qty 1

## 2021-07-05 MED ORDER — INSULIN DETEMIR 100 UNIT/ML ~~LOC~~ SOLN
12.0000 [IU] | Freq: Two times a day (BID) | SUBCUTANEOUS | Status: DC
  Filled 2021-07-05 (×2): qty 0.12

## 2021-07-05 NOTE — Progress Notes (Addendum)
NAME:  Manuel Leonard, MRN:  [id number], DOB:  1951-07-04, LOS: 5 ADMISSION DATE:  06/30/2021, CONSULTATION DATE: 07/01/2016 REFERRING MD:  Dr. Gwendlyn Deutscher, CHIEF COMPLAINT: Shock  History of Present Illness:  Manuel Leonard is a 70 y.o. male with a past medical history significant for COPD, atrial fibrillation anticoagulated on Xarelto hypertension, hyperlipidemia, diabetes, and tobacco abuse who presented initially to the emergency department after family found patient unresponsive with right-sided gaze.  Last known normal 10 PM patient was also seen significantly hypoxic with oxygen saturation 70% on room air.  Code stroke called on admission.  On arrival patient was seen minimally responsive with temp 101.7, tachypnea, tachycardia, and minimal responsiveness.  Patient was placed on BiPAP with initial ABG; 7.26 /70.2 /450 /31.  Neurology was consulted and patient underwent stat Head CT which was negative for any acute findings.  Decreased mentation felt secondary to hypercapnia.  Patient was initially admitted under family medicine teaching service who felt patient's presentation was likely related to sepsis secondary to CAP, patient was also found Flu A positive while in the emergency department patient's lab work continued to decline including worsening renal function, elevated lactic acid, and elevated troponin.  This prompted PCCM consultation.  Pertinent  Medical History  COPD Hypertension Hyperlipidemia Diabetes Tobacco abuse  Significant Hospital Events: Including procedures, antibiotic start and stop dates in addition to other pertinent events   11/7 admitted after being found unresponsive 11/7 intubated by anesthesiology - easy intubation.  11/7 mild troponin rise with pseudo-infarct pattern on EKG with no regional wall motion abnormalities.  11/8 episodes of generalized tremulousness with associated desaturation. EEG negative. Seen by neurology, deemed essential tremor 11/9  extubated   Interim History / Subjective:  NAEON. Did not wear CPAP overnight as he refused.  Objective   Blood pressure 137/88, pulse 82, temperature 97.8 F (36.6 C), temperature source Oral, resp. rate 19, height 5\' 11"  (1.803 m), weight (!) 137.9 kg, SpO2 98 %.        Intake/Output Summary (Last 24 hours) at 07/05/2021 0756 Last data filed at 07/05/2021 0000 Gross per 24 hour  Intake --  Output 1950 ml  Net -1950 ml    Filed Weights   07/03/21 0500 07/04/21 0500 07/05/21 0500  Weight: 133.6 kg (!) 140.6 kg (!) 137.9 kg    Examination: General: Morbidly obese man, sitting up in recliner, son at bedside. HEENT: [place]/AT.  MMM. Neuro: A&O x 3, no deficits though globally a bit weak.  CV: IRIR, no M/R/G. PULM: Diminished bilaterally. GI: soft, non-tender. Extremities: warm/dry, generalized nonpitting edema  Skin: no rashes or lesions  Ancillary tests  CXR personally reviewed and only mild interstitial pattern ABG show respiratory acidosis improved from yesterday Cultures are negative.  Creatinine has leveled off at 2.42 but still up from baseline.  Modest BNP and Troponin elevation both less than 500 EKG show new 45mm ST elevation in inferior leads but no inferior WMA on echo.   Assessment & Plan:   Acute Hypoxic and hypercapnic Respiratory Failure - 2/2 Influenza A, probable CAP, AECOPD. He is currently on day 4/5 of Tamiflu and day 5/7 of Ceftriaxone. Presumed OSA / OHS. Tobacco abuse. - Continue supplemental O2 as needed to maintain SpO2 > 90. - Ambulatory desaturation study to document his need for home O2. - Continue Tamiflu through 11/11, Ceftriaxone for 2 more days. - Nocturnal CPAP. - Needs outpatient PSG, will send a message to our office to help arrange. - Outpatient f/u with pulmonary, will  send a message to our office to help arrange. - PT eval, needs to mobilize. - Weight loss encouraged. - Tobacco cessation counseling provided and strongly encouraged  him to quit for life.   Best Practice (right click and "Reselect all SmartList Selections" daily)  Per primary team.   Montey Hora, [place] For pager details, please see AMION or use Epic chat  After 1900, please call [facility] for cross coverage needs 07/05/2021, 8:12 AM    PCCM attending:  This is a 70 year old gentleman, past medical history of COPD atrial fibrillation on Xarelto, hypertension, hyperlipidemia, diabetes, tobacco abuse.  Initially came in as a code stroke.  CTA head was negative.  Ultimately patient was  Influenza, Acute Hypoxemic Hypercarbic Respiratory Failure on Tamiflu and ceftriaxone  BP (!) 157/88   Pulse 92   Temp (!) 97.5 F (36.4 C) (Axillary)   Resp 15   Ht 5\' 11"  (1.803 m)   Wt (!) 137.9 kg   SpO2 92%   BMI 42.40 kg/m   General: Obese male resting in chair HEENT: Tracking appropriately heart: Regular rhythm S1-S2, distant heart tones Lungs: No wheeze, no crackles  Labs: Reviewed  Assessment: Acute hypoxemic hypercapnic respiratory failure Shortness of , Acute exacerbation of COPD Tobacco abuse  Plan: Continue to wean O2 supplement to maintain SPO2 greater than 90 Needs mobility Outpatient sleep study at discharge We will arrange follow-up for the patient to the hospital. Smoking cessation  Pulmonary will sign off at this time. Please call with any questions.  Garner Nash, DO [facility] 07/05/2021 2:36 PM

## 2021-07-05 NOTE — Progress Notes (Signed)
Family Medicine Teaching Service Daily Progress Note Intern Pager: [phone]  Patient name: Manuel Leonard Medical record number: [id number] Date of birth: Apr 30, 1951 Age: 70 y.o. Gender: male  Primary Care Provider: Ward, Carlyon Shadow, FNP Consultants: CCM, neurology  Code Status: Full  Pt Overview and Major Events to Date:  11/06   Admitted by FPTS early evening 11/07   0630 Admitted to ICU early AM, intubated              0425-0830 precedex infusion             Start levophed, propofol, insulin, fentanyl infusion 11/08   0030 Seizure-like episode with desaturation and bigeminy on EKG             0230 Intermittent arrhythmias              0300 Lethargic but responding to commands              0830-1200 EEG: diffuse encephalopathy w/o seizures or epileptiform changes             Neuro believes tremulousness to be essential tremor              Stop insulin infusion 11/09   Stop levophed, proprofol, fentanyl infusion 1030 Extubated  11/10   Lines removed, home meds restarted, start CPAP 11/11   0700 Out to floor with FPTS  Assessment and Plan: Patient is a 70 year old male who presented after being found unresponsive with right-sided gaze, returning to [facility] from ICU.  PMH of COPD, atrial fibrillation anticoagulated on Xarelto, HTN, HLD, T2DM, and tobacco use disorder-severe/dependent.  Acute hypercarbic respiratory failure  COPD  influenza A Patient intubated x2 days while in ICU, due to persistent hypercarbia despite Solu-Medrol, DuoNebs, BiPAP with increasing expiratory pressures.  Extubated 11/9; current 98% O2 sat on 5 L.  Coverage and Tamiflu x5 days.  Blood cultures no growth x4 days. - Currently on day 5/5 Tamiflu and 5/7 CTX; Switch to Cefdinir 300 mg BID x 2. - Procalcitonin level to be obtained. - Continue Incruse Ellipta 62.5 mcg 1 puff daily and DuoNeb 3 mL q2h PRN wheezing and dyspnea - Continue supplemental O2 PRN for O2 > 90. - Needs ambulatory desaturation  study for home O2 requirement.  - Continue Nocturnal CPAP. - Outpatient f/u with pulmonary  - PT evaluation   T2DM AM CBG 118; 103-196 over past 24 hours. - Continue Levemir, but decrease to 10 units qhs - Discontinue rSSI with bedtime correction - Continue Metformin 500 mg BID with meals; restart Trulicity once home.  Thrombocytopenia, improving Plt 114, inc from 100 while in ICU; 132 on admission -Continue to monitor with daily CBC  AKI, resolved Cr 1.20, dec from 1.56 and 2.49 on admission - Continue to monitor with daily BMP  NSTEMI Troponin inc from 198 to 438. No ST elevation, so likely demand ischemia 2/2 septic shock. ECHO with EF 50-55%. - Continue Aspirin 81 mg daily  H/o DVT  Atrial Fibrillation? No Atrial Fibrillation during admission on EKG or telemetry. History of DVT in September 2020 - L femoral and L popliteal veins; patient has not filled Xarelto since 2020 per pharmacy records. - Discontinue Xarelto 20 mg  Smoking Cessation - Continue Wellbutrin 150 mg daily x 3 days (started 11/10), then increase to 150 mg BID (to start 11/13).  Leukocytosis, resolved WBC 9.7 this AM with dec from 19.4 on admission - Continue to monitor with daily CBC   FEN/GI: Heart healthy/carb modified diet  PPx: Lovenox Dispo: Pending PT/OT evaluation  Subjective:  Patient reports feeling better overall. He denies all pain and dyspnea on [place]. He says that he does not like wearing the CPAP mask at night, but voiced understanding when the importance was explained. All questions and concerns were   Objective: Temp:  [97.8 F (36.6 C)-98.2 F (36.8 C)] 97.8 F (36.6 C) (11/10 2000) Pulse Rate:  [79-100] 82 (11/11 0400) Resp:  [17-26] 19 (11/11 0400) BP: (99-148)/(67-88) 137/88 (11/11 0400) SpO2:  [87 %-99 %] 98 % (11/11 0400) Weight:  [137.9 kg] 137.9 kg (11/11 0500) Physical Exam: General: Obese, chronically ill appearing male in mild distress Cardiovascular: Regular rate and  rhythm in what was auscultated; Auscultation limited by body habitus Respiratory: Mildly increased work of breathing on 5 L [place]; CTA anteriorly; patient unable to lean forward to auscultate posteriorly Abdomen: obese, NTND, possible fluid wave present Extremities: Non-edematous BLE, ecchymoses present; no tenderness, improved from admission.  Laboratory: Recent Labs  Lab 07/03/21 0419 07/04/21 0419 07/05/21 0232  WBC 14.0* 11.5* 9.7  HGB 15.5 15.8 17.0  HCT 49.2 51.0 55.7*  PLT 100* 110* 114*   Recent Labs  Lab 06/30/21 1506 06/30/21 1528 07/03/21 0419 07/04/21 0419 07/05/21 0232  NA 136   < > 140 146* 144  K 5.5*   < > 4.9 4.9 5.2*  CL 98   < > 103 107 107  CO2 23   < > 26 31 29   BUN 23   < > 98* 78* 60*  CREATININE 1.67*   < > 2.43* 1.56* 1.20  CALCIUM 8.5*   < > 8.3* 8.6* 8.7*  PROT 7.1  --   --   --   --   BILITOT 1.7*  --   --   --   --   ALKPHOS 79  --   --   --   --   ALT 50*  --   --   --   --   AST 67*  --   --   --   --   GLUCOSE 207*   < > 188* 104* 133*   < > = values in this interval not displayed.    Imaging/Diagnostic Tests: None new  Rosezetta Schlatter, MD 07/05/2021, 7:14 AM PGY-1, [place] Intern pager: [phone], text pages welcome

## 2021-07-05 NOTE — Telephone Encounter (Signed)
Patient is still in-patient. Hospital F/U appt made. Appt will be on D/C paperwork.   Will message provider to see if he wants a home sleep study or in lab sleep study.   Please advise. Thanks :)

## 2021-07-05 NOTE — Progress Notes (Signed)
Pt preferred to stay in the recliner for the night.

## 2021-07-05 NOTE — Progress Notes (Signed)
Oxygen Saturation Qualification (This note is used to comply with regulatory documentation for home oxygen)   Patient Saturations on Room Air at Rest = 88%   Patient Saturations on Room Air while Ambulating = 66%   Patient Saturations on 4 Liters of oxygen while Ambulating = 96%   Please briefly explain why patient needs home oxygen: Pt unable to tolerate >10 ft ambulation without supplemental O2.   West Carbo, PT, DPT   Acute Rehabilitation Department Pager #: [phone]

## 2021-07-05 NOTE — Evaluation (Signed)
Physical Therapy Evaluation Patient Details Name: Manuel Leonard MRN: [id number] DOB: 10-Dec-1950 Today's Date: 07/05/2021  History of Present Illness  The pt is a 70 yo male presenting 11/6 after family found the pt unresponsive with R-sided gaze. Code stroke activated, but imaging was negative for acute infarct, AMS thought to be due to hypercapnia and sepsis related to CAP and flu. Pt intubated 11/7 and extubated 11/9. PMH includes: tobacco use, COPD, afib on Xarelto, HTN, HLD, DM II, and obesity.   Clinical Impression  Pt in bed upon arrival of PT, agreeable to evaluation at this time. Prior to admission the pt led a sedentary lifestyle, but was independent with all mobility, ADLs, and IADLs without need for DME. The pt now presents with limitations in functional mobility, power, muscular endurance, dynamic stability, and activity tolerance due to above dx, and will continue to benefit from skilled PT to address these deficits. The pt was able to complete sit-stand from recliner without need for assist to power up, but required HHA to steady once in standing and eventually required BUE support for gait. The pt tolerated only 31ft on RA with ambulation, SpO2 dropped to low of 66% and the pt required 30 sec of seated rest, guided breathing, and 4L O2 to recover SpO2 to 90s. The pt was then able to complete 170ft with use of rollator and 4L O2 without any issue or change in SpO2. Will continue to benefit from skilled PT acutely and after d/c to improve functional endurance and stability as well as to monitor progressive return to full activity.   SATURATION QUALIFICATIONS: (This note is used to comply with regulatory documentation for home oxygen)  Patient Saturations on Room Air at Rest = 88%  Patient Saturations on Room Air while Ambulating = 66%  Patient Saturations on 4 Liters of oxygen while Ambulating = 96%  Please briefly explain why patient needs home oxygen: Pt unable to tolerate >10  ft ambulation without supplemental O2.    Recommendations for follow up therapy are one component of a multi-disciplinary discharge planning process, led by the attending physician.  Recommendations may be updated based on patient status, additional functional criteria and insurance authorization.  Follow Up Recommendations Home health PT    Assistance Recommended at Discharge Frequent or constant Supervision/Assistance  Functional Status Assessment Patient has had a recent decline in their functional status and demonstrates the ability to make significant improvements in function in a reasonable and predictable amount of time.  Equipment Recommendations  Rollator (4 wheels);BSC/3in1    Recommendations for Other Services       Precautions / Restrictions Precautions Precautions: Fall Precaution Comments: watch O2 Restrictions Weight Bearing Restrictions: No      Mobility  Bed Mobility Overal bed mobility: Needs Assistance Bed Mobility: Sit to Supine       Sit to supine: Mod assist   General bed mobility comments: modA to bring BLE into bed, pt able to reposition in bed    Transfers Overall transfer level: Needs assistance Equipment used: None Transfers: Sit to/from Stand Sit to Stand: Min guard           General transfer comment: pt able to power up from chair without assist, reaching for UE support once in standing position    Ambulation/Gait Ambulation/Gait assistance: Min assist;Min guard;+2 safety/equipment Gait Distance (Feet): 30 Feet (+ 181ft) Assistive device: 1 person hand held assist;Rollator (4 wheels) Gait Pattern/deviations: Step-to pattern;Decreased stride length;Trunk flexed Gait velocity: decreased Gait velocity interpretation: <1.31 ft/sec,  indicative of household ambulator   General Gait Details: pt initially declining need for rollator,seeing first single UE support through [facility] and furniture in other hand for support, then agreeable to BUE  support on rollator. 30 ft with RA and SpO2 dropped to 66%, recovered with seated rest and 4L O2 in 30 sec. Pt then able to ambulate 153ft with rollator and 4LO2 with VSS      Balance Overall balance assessment: Needs assistance Sitting-balance support: Feet supported;No upper extremity supported Sitting balance-Leahy Scale: Good     Standing balance support: Bilateral upper extremity supported;During functional activity Standing balance-Leahy Scale: Poor Standing balance comment: reaching for UE support after initial stand, BUE support with gait                             Pertinent Vitals/Pain Pain Assessment: No/denies pain    Home Living Family/patient expects to be discharged to:: Private residence Living Arrangements: Spouse/significant other Available Help at Discharge: Family;Available 24 hours/day Type of Home: House Home Access: Stairs to enter Entrance Stairs-Rails: Right Entrance Stairs-Number of Steps: 3 Alternate Level Stairs-Number of Steps: doesn't go upstairs Home Layout: Two level;Able to live on main level with bedroom/bathroom Home Equipment: Shower seat      Prior Function Prior Level of Function : Independent/Modified Independent               ADLs Comments: sits to shower     Hand Dominance   Dominant Hand: Right    Extremity/Trunk Assessment   Upper Extremity Assessment Upper Extremity Assessment: Defer to OT evaluation    Lower Extremity Assessment Lower Extremity Assessment: Overall WFL for tasks assessed (limited muscular endurance, but pt able to power up without assist or buckling noted)    Cervical / Trunk Assessment Cervical / Trunk Assessment: Kyphotic;Other exceptions Cervical / Trunk Exceptions: large body habitus  Communication   Communication: Prefers language other than English [place])  Cognition Arousal/Alertness: Awake/alert Behavior During Therapy: WFL for tasks assessed/performed Overall Cognitive  Status: Within Functional Limits for tasks assessed                                 General Comments: pt following all commands given (follows simple cues in [place]), son also giving cues in [place] comments (skin integrity, edema, etc.): SpO2 to 885 on RA, low of 66% with ambulation on RA, recovered to 90s on 4L and maintained with ambulation on 4L    Exercises     Assessment/Plan    PT Assessment Patient needs continued PT services  PT Problem List Decreased strength;Decreased activity tolerance;Decreased balance;Decreased mobility;Cardiopulmonary status limiting activity       PT Treatment Interventions DME instruction;Gait training;Stair training;Functional mobility training;Therapeutic activities;Therapeutic exercise;Balance training;Patient/family education    PT Goals (Current goals can be found in the Care Plan section)  Acute Rehab PT Goals Patient Stated Goal: to return home PT Goal Formulation: With patient/family Time For Goal Achievement: 07/19/21 Potential to Achieve Goals: Good    Frequency Min 3X/week   Barriers to discharge        Co-evaluation PT/OT/SLP Co-Evaluation/Treatment: Yes Reason for Co-Treatment: For patient/therapist safety;To address functional/ADL transfers PT goals addressed during session: Mobility/safety with mobility;Balance;Proper use of DME;Strengthening/ROM         AM-PAC PT "6 Clicks" Mobility  Outcome Measure Help needed turning  from your back to your side while in a flat bed without using bedrails?: A Lot Help needed moving from lying on your back to sitting on the side of a flat bed without using bedrails?: A Lot Help needed moving to and from a bed to a chair (including a wheelchair)?: A Little Help needed standing up from a chair using your arms (e.g., wheelchair or bedside chair)?: A Little Help needed to walk in hospital room?: A Little Help needed climbing 3-5 steps with a  railing? : A Little 6 Click Score: 16    End of Session Equipment Utilized During Treatment: Gait belt;Oxygen Activity Tolerance: Patient tolerated treatment well Patient left: in bed;with call bell/phone within reach;with family/visitor present Nurse Communication: Mobility status PT Visit Diagnosis: Other abnormalities of gait and mobility (R26.89)    Time: 9021-1155 PT Time Calculation (min) (ACUTE ONLY): 37 min   Charges:   PT Evaluation $PT Eval Moderate Complexity: 1 Mod          West Carbo, PT, DPT   Acute Rehabilitation Department Pager #: [phone]  Sandra Cockayne 07/05/2021, 12:16 PM

## 2021-07-05 NOTE — Evaluation (Signed)
Occupational Therapy Evaluation Patient Details Name: Manuel Leonard MRN: [id number] DOB: [birth year] Today's Date: 07/05/2021   History of Present Illness The pt is a 70 yo male presenting 11/6 after family found the pt unresponsive with R-sided gaze. Code stroke activated, but imaging was negative for acute infarct, AMS thought to be due to hypercapnia and sepsis related to CAP and flu. Pt intubated 11/7 and extubated 11/9. PMH includes: tobacco use, COPD, afib on Xarelto, HTN, HLD, DM II, and obesity.   Clinical Impression   Pt was independent prior to admission. He sat to shower. 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 Pt's son assisted with interpreting, although pt understands and speaks some [place]. Pt presents with decreased activity tolerance with Sp02 dropping to 66% on RA, but able to maintain levels in the mid to upper 90s on 4L during ambulation with rollator. Pt needs set up to moderate assistance for ADL and min guard assist for ambulation. Began educating pt and son in energy conservation and pursed lip breathing. Will follow acutely.      Recommendations for follow up therapy are one component of a multi-disciplinary discharge planning process, led by the attending physician.  Recommendations may be updated based on patient status, additional functional criteria and insurance authorization.   Follow Up Recommendations  Home health OT    Assistance Recommended at Discharge Intermittent Supervision/Assistance  Functional Status Assessment  Patient has had a recent decline in their functional status and demonstrates the ability to make significant improvements in function in a reasonable and predictable amount of time.  Equipment Recommendations  None recommended by OT    Recommendations for Other Services       Precautions / Restrictions Precautions Precautions: Fall Precaution Comments: watch O2 Restrictions Weight Bearing  Restrictions: No      Mobility Bed Mobility Overal bed mobility: Needs Assistance Bed Mobility: Sit to Supine       Sit to supine: Mod assist   General bed mobility comments: assist for LEs back into bed    Transfers Overall transfer level: Needs assistance Equipment used: None Transfers: Sit to/from Stand Sit to Stand: Min guard           General transfer comment: pt able to power up from chair without assist, reaching for UE support once in standing position      Balance Overall balance assessment: Needs assistance Sitting-balance support: Feet supported;No upper extremity supported Sitting balance-Leahy Scale: Good     Standing balance support: Bilateral upper extremity supported;During functional activity Standing balance-Leahy Scale: Poor Standing balance comment: reaching for UE support after initial stand, BUE support with gait                           ADL either performed or assessed with clinical judgement   ADL Overall ADL's : Needs assistance/impaired Eating/Feeding: Independent;Sitting   Grooming: Min guard;Standing   Upper Body Bathing: Minimal assistance;Sitting   Lower Body Bathing: Moderate assistance;Sit to/from stand   Upper Body Dressing : Set up;Sitting   Lower Body Dressing: Maximal assistance;Sit to/from stand   Toilet Transfer: Min guard;Ambulation;Rollator (4 wheels)   Toileting- Clothing Manipulation and Hygiene: Minimal assistance;Sit to/from stand       Functional mobility during ADLs: Engineer, manufacturing (4 wheels) General ADL Comments: pt with poor activity tolerance     Vision Baseline Vision/History: 1 Wears glasses Patient Visual Report: No change from baseline  Perception     Praxis      Pertinent Vitals/Pain Pain Assessment: No/denies pain     Hand Dominance Right   Extremity/Trunk Assessment Upper Extremity Assessment Upper Extremity Assessment: Overall WFL for tasks assessed   Lower  Extremity Assessment Lower Extremity Assessment: Defer to PT evaluation   Cervical / Trunk Assessment Cervical / Trunk Assessment: Other exceptions Cervical / Trunk Exceptions: large body habitus   Communication Communication Communication: Prefers language other than English [place])   Cognition Arousal/Alertness: Awake/alert Behavior During Therapy: WFL for tasks assessed/performed Overall Cognitive Status: Within Functional Limits for tasks assessed                                 General Comments: son interpreted, speaks and understands some English     General Comments  SpO2 to 885 on RA, low of 66% with ambulation on RA, recovered to 90s on 4L and maintained with ambulation on 4L    Exercises     Shoulder Instructions      Home Living Family/patient expects to be discharged to:: Private residence Living Arrangements: Spouse/significant other Available Help at Discharge: Family;Available 24 hours/day Type of Home: House Home Access: Stairs to enter [employer] of Steps: 3 Entrance Stairs-Rails: Right Home Layout: Two level;Able to live on main level with bedroom/bathroom Alternate Level Stairs-Number of Steps: doesn't go upstairs   Bathroom Shower/Tub: Occupational psychologist: Standard Bathroom Accessibility: Yes   Home Equipment: Shower seat          Prior Functioning/Environment Prior Level of Function : Independent/Modified Independent               ADLs Comments: sits to shower        OT Problem List: Decreased activity tolerance;Impaired balance (sitting and/or standing);Decreased knowledge of use of DME or AE;Obesity;Cardiopulmonary status limiting activity      OT Treatment/Interventions: Self-care/ADL training;Patient/family education;Balance training;Therapeutic activities;Energy conservation;DME and/or AE instruction    OT Goals(Current goals can be found in the care plan section) Acute Rehab OT  Goals OT Goal Formulation: With patient Time For Goal Achievement: 07/19/21 Potential to Achieve Goals: Good ADL Goals Pt Will Perform Grooming: with supervision;standing (3 activities) Pt Will Perform Lower Body Bathing: with supervision;sit to/from stand Pt Will Perform Lower Body Dressing: with supervision;sit to/from stand Pt Will Transfer to Toilet: with supervision;ambulating;regular height toilet Pt Will Perform Toileting - Clothing Manipulation and hygiene: with supervision;sit to/from stand Additional ADL Goal #1: Pt will generalize energy conservation and pursed lip breathing in ADL and mobility.  OT Frequency: Min 2X/week   Barriers to D/C:            Co-evaluation   Reason for Co-Treatment: For patient/therapist safety;To address functional/ADL transfers PT goals addressed during session: Mobility/safety with mobility;Balance;Proper use of DME;Strengthening/ROM        AM-PAC OT "6 Clicks" Daily Activity     Outcome Measure Help from another person eating meals?: None Help from another person taking care of personal grooming?: A Little Help from another person toileting, which includes using toliet, bedpan, or urinal?: A Little Help from another person bathing (including washing, rinsing, drying)?: A Lot Help from another person to put on and taking off regular upper body clothing?: A Little Help from another person to put on and taking off regular lower body clothing?: A Lot 6 Click Score: 17   End of Session Equipment Utilized During Treatment:  Rollator (4 wheels);Oxygen (4L) Nurse Communication: Mobility status  Activity Tolerance: Patient limited by fatigue Patient left: in bed;with call bell/phone within reach;with family/visitor present  OT Visit Diagnosis: Unsteadiness on feet (R26.81);Other abnormalities of gait and mobility (R26.89);Muscle weakness (generalized) (M62.81);Other (comment) (decreased activity tolerance)                Time: 2094-7096 OT  Time Calculation (min): 36 min Charges:  OT General Charges $OT Visit: 1 Visit OT Evaluation $OT Eval Moderate Complexity: 1 Mod  Nestor Lewandowsky, OTR/L Acute Rehabilitation Services Pager: [phone] Office: [phone]   Malka So 07/05/2021, 12:50 PM

## 2021-07-06 DIAGNOSIS — R0902 Hypoxemia: Secondary | ICD-10-CM

## 2021-07-06 LAB — CBC
HCT: 51.9 % (ref 39.0–52.0)
Hemoglobin: 16 g/dL (ref 13.0–17.0)
MCH: 29.8 pg (ref 26.0–34.0)
MCHC: 30.8 g/dL (ref 30.0–36.0)
MCV: 96.6 fL (ref 80.0–100.0)
Platelets: 122 10*3/uL — ABNORMAL LOW (ref 150–400)
RBC: 5.37 MIL/uL (ref 4.22–5.81)
RDW: 14.1 % (ref 11.5–15.5)
WBC: 9.3 10*3/uL (ref 4.0–10.5)
nRBC: 0 % (ref 0.0–0.2)

## 2021-07-06 LAB — BASIC METABOLIC PANEL
Anion gap: 6 (ref 5–15)
BUN: 39 mg/dL — ABNORMAL HIGH (ref 8–23)
CO2: 34 mmol/L — ABNORMAL HIGH (ref 22–32)
Calcium: 8.6 mg/dL — ABNORMAL LOW (ref 8.9–10.3)
Chloride: 102 mmol/L (ref 98–111)
Creatinine, Ser: 0.97 mg/dL (ref 0.61–1.24)
GFR, Estimated: >60 mL/min (ref >60)
Glucose, Bld: 76 mg/dL (ref 70–99)
Potassium: 4.3 mmol/L (ref 3.5–5.1)
Sodium: 142 mmol/L (ref 135–145)

## 2021-07-06 LAB — GLUCOSE, CAPILLARY
Glucose-Capillary: 74 mg/dL (ref 70–99)
Glucose-Capillary: 99 mg/dL (ref 70–99)
Glucose-Capillary: 131 mg/dL — ABNORMAL HIGH (ref 70–99)
Glucose-Capillary: 171 mg/dL — ABNORMAL HIGH (ref 70–99)

## 2021-07-06 MED ORDER — IPRATROPIUM-ALBUTEROL 0.5-2.5 (3) MG/3ML IN SOLN
3.0000 mL | Freq: Four times a day (QID) | RESPIRATORY_TRACT | Status: DC
  Administered 2021-07-06 – 2021-07-07 (×3): 3 mL via RESPIRATORY_TRACT
  Filled 2021-07-06 (×3): qty 3

## 2021-07-06 NOTE — Progress Notes (Signed)
Pt called RN to the room in order to take Bipap off. Pt refusing to wear Bipap anymore and placed back on 4L nasal cannula.

## 2021-07-06 NOTE — Progress Notes (Signed)
Physical Therapy Treatment Patient Details Name: Manuel Leonard MRN: [id number] DOB: 1951/06/13 Today's Date: 07/06/2021   History of Present Illness The pt is a 70 yo male presenting 06/30/21 after family found the pt unresponsive with R-sided gaze. Code stroke activated, but imaging was negative for acute infarct, AMS thought to be due to hypercapnia and sepsis related to CAP and flu. Pt intubated 11/7 and extubated 11/9. PMH includes: tobacco use, COPD, afib on Xarelto, HTN, HLD, DM II, and obesity.    PT Comments    Pt is worse today than yesterday, self reporting feeling his breathing is worse.  Daughter in room an confirms she was here yesterday and he sounded much better than today.  He still needs supplemental O2 for gait 2-4 L and gets very dyspnic 3-4/4 DOE which required seated rest during gait.  PT will continue to follow acutely for safe mobility progression.    Recommendations for follow up therapy are one component of a multi-disciplinary discharge planning process, led by the attending physician.  Recommendations may be updated based on patient status, additional functional criteria and insurance authorization.  Follow Up Recommendations  Home health PT     Assistance Recommended at Discharge Frequent or constant Supervision/Assistance  Equipment Recommendations  Rollator (4 wheels);BSC/3in1;Other (comment) (likely home O2)    Recommendations for Other Services       Precautions / Restrictions Precautions Precautions: Fall Precaution Comments: watch O2     Mobility  Bed Mobility Overal bed mobility: Needs Assistance Bed Mobility: Sit to Supine       Sit to supine: Mod assist   General bed mobility comments: Assist to help both legs back into the bed.    Transfers Overall transfer level: Needs assistance Equipment used: Rollator (4 wheels) Transfers: Sit to/from Stand Sit to Stand: Min assist           General transfer comment: Min assist from  low recliner, BSC and rollator seat    Ambulation/Gait Ambulation/Gait assistance: Min assist Gait Distance (Feet): 50 Feet (x2 with seated rest break due to 3-4/4 DOE) Assistive device: Rollator (4 wheels) Gait Pattern/deviations: Step-through pattern;Shuffle;Trunk flexed Gait velocity: decreased Gait velocity interpretation: <1.8 ft/sec, indicate of risk for recurrent falls   General Gait Details: Pt with slow, shuffling gait, as he fatigued it got harder for him to control the rollator. I made him sit down at 58' because he was breathing so heavily, sweating.   Stairs             Wheelchair Mobility    Modified Rankin (Stroke Patients Only)       Balance Overall balance assessment: Needs assistance Sitting-balance support: Feet supported;Bilateral upper extremity supported Sitting balance-Leahy Scale: Fair     Standing balance support: Bilateral upper extremity supported Standing balance-Leahy Scale: Poor Standing balance comment: needs UE support in standing.                            Cognition Arousal/Alertness: Awake/alert Behavior During Therapy: WFL for tasks assessed/performed Overall Cognitive Status: Within Functional Limits for tasks assessed                                 General Comments: daughter present, and interpreting some, but not a lot.  He seemed to understand what I was asking of him although can only speak very little [place].  He is from [place]  Exercises Other Exercises Other Exercises: IS 2x5 resp, max inspired volume 600 mL cues for correct technique    General Comments General comments (skin integrity, edema, etc.): O2 sats on RA at rest 87%, with gait on 2 L 93%, with gait on 4 L 100%.      Pertinent Vitals/Pain Pain Assessment: No/denies pain    Home Living                          Prior Function            PT Goals (current goals can now be found in the care plan section)  Acute Rehab PT Goals Patient Stated Goal: to return home Progress towards PT goals: Progressing toward goals    Frequency    Min 3X/week      PT Plan Current plan remains appropriate    Co-evaluation              AM-PAC PT "6 Clicks" Mobility   Outcome Measure  Help needed turning from your back to your side while in a flat bed without using bedrails?: A Lot Help needed moving from lying on your back to sitting on the side of a flat bed without using bedrails?: A Lot Help needed moving to and from a bed to a chair (including a wheelchair)?: A Little Help needed standing up from a chair using your arms (e.g., wheelchair or bedside chair)?: A Little Help needed to walk in hospital room?: A Little Help needed climbing 3-5 steps with a railing? : A Lot 6 Click Score: 15    End of Session Equipment Utilized During Treatment: Gait belt;Oxygen Activity Tolerance: Patient limited by fatigue Patient left: in bed;with call bell/phone within reach;with family/visitor present   PT Visit Diagnosis: Other abnormalities of gait and mobility (R26.89)     Time: 1962-2297 PT Time Calculation (min) (ACUTE ONLY): 44 min  Charges:  $Gait Training: 23-37 mins $Therapeutic Activity: 8-22 mins                   Verdene Lennert, PT, DPT  Acute Rehabilitation Ortho Tech Supervisor [phone] pager [phone]) [phone] office

## 2021-07-06 NOTE — Plan of Care (Addendum)
Patient admitted 11/6 with Flu A. Now Med-Surg level of care; awaiting bed placement. No acute events overnight. Patient received CPAP as ordered.   Problem: Education: Goal: Knowledge of General Education information will improve Description: Including pain rating scale, medication(s)/side effects and non-pharmacologic comfort measures Outcome: Progressing   Problem: Health Behavior/Discharge Planning: Goal: Ability to manage health-related needs will improve Outcome: Progressing   Problem: Clinical Measurements: Goal: Ability to maintain clinical measurements within normal limits will improve Outcome: Progressing Goal: Will remain free from infection Outcome: Progressing Goal: Diagnostic test results will improve Outcome: Progressing Goal: Respiratory complications will improve Outcome: Progressing Goal: Cardiovascular complication will be avoided Outcome: Progressing   Problem: Activity: Goal: Risk for activity intolerance will decrease Outcome: Progressing   Problem: Nutrition: Goal: Adequate nutrition will be maintained Outcome: Progressing   Problem: Coping: Goal: Level of anxiety will decrease Outcome: Progressing   Problem: Elimination: Goal: Will not experience complications related to bowel motility Outcome: Progressing Goal: Will not experience complications related to urinary retention Outcome: Progressing   Problem: Pain Managment: Goal: General experience of comfort will improve Outcome: Progressing   Problem: Safety: Goal: Ability to remain free from injury will improve Outcome: Progressing   Problem: Skin Integrity: Goal: Risk for impaired skin integrity will decrease Outcome: Progressing

## 2021-07-06 NOTE — Progress Notes (Signed)
FPTS Brief Progress Note  S:Patient sleeping comfortably in bed, RN with no complaints or concerns.    O: BP 136/70   Pulse 86   Temp 98.1 F (36.7 C) (Oral)   Resp (!) 22   Ht 5\' 11"  (1.803 m)   Wt (!) 137.9 kg   SpO2 95%   BMI 42.40 kg/m     A/P: Acute hypercarbic respiratory failure  COPD  influenza A Continue plans per day team  - Orders reviewed. Labs for AM ordered, which was adjusted as needed.    Holley Bouche, MD 07/06/2021, 2:10 AM PGY-1, [facility] Family Medicine Night Resident  Please page [phone] with questions.

## 2021-07-06 NOTE — Progress Notes (Signed)
Patient already on CPAP HS. 4L O2 bleed in needed. Patient tolerating well at this time.

## 2021-07-06 NOTE — Discharge Summary (Addendum)
[facility] Discharge Summary  Patient name: Manuel Leonard Medical record number: [id number] Date of birth: 03-13-1951 Age: 70 y.o. Gender: male Date of Admission: 06/30/2021  Date of Discharge: 07/08/2021 Admitting Physician: Lyndee Hensen, DO  Primary Care Provider: Ward, Carlyon Shadow, FNP Consultants: CCM, Neurology  Indication for Hospitalization: Obtundation and labored breathing, found to be hypoxic to 70s on room air via EMS.  Discharge Diagnoses/Problem List:  Acute hypercarbic respiratory failure COPD Type 2 diabetes mellitus Thrombocytopenia NSTEMI Tobacco use disorder-dependent/severe  Hospital problems resolved during this admission: Influenza A  Disposition: Home with HH PT and HH OT  Discharge Condition: Stable  Discharge Exam: General: Obese, chronically ill-appearing male in no acute distress Cardiovascular: RRR, no murmurs nor abnormal heart sounds appreciated Respiratory: CTA x2; lower lobe breath sounds difficult to auscultate bilaterally.  Normal work of breathing with 2 L O2 on nasal cannula Abdominal: Obese, soft, nontender, nondistended Extremities: Nonedematous bilateral lower extremities, ecchymosis consistent with venous stasis present Neurological: Ambulation now requiring rolling walker.  A&O x4 Psychiatric: Normal mood and affect  Brief Hospital Course:  Patient is a 70 year old male who presented after being found unresponsive due to acute respiratory failure with right-sided gaze.  PMH of COPD, DVT anticoagulated on Xarelto in 2020, HTN, HLD, T2DM, and tobacco use disorder-severe/dependent.  Acute hypercapnic and hypoxemic respiratory failure  COPD  Influenza A EMS was called, and patient was found to be unresponsive and hypoxic to 70s on RA. DuoNebs were given. Pt found to be hypoxic and hypercapnic in ED. Patient was initiated on BiPAP, and improvement in mentation noted. Found to have hypercapnia and  hypoxemia. Incidentally found to be Influenza A positive and tamiflu course was started and completed during hospitalization (11/6-11/10). CAP and COPD exacerbation coverage was provided with IV antibiotics.  Blood and urine cultures showed no growth to date. Had intermittent chest pains with elevated troponins to 400s. CTA Chest was negative for PE.  Patient's lab work continued to decline including worsening renal function, elevated lactic acid, and elevated troponin which prompted PCCM consultation. Pt was intubated and started on pressures in the ICU. He was noted to have a seizure-like episode with desaturation and bigeminy on EKG. EEG showed diffuse encephalopathy w/o seizures or epileptiform changes. Neurology believed tremulousness to be essential tremor  Stopped insulin infusion. He was extubated two days later. He was transferred back to the floor on 11/11. Ambulatory pulse oximetry showed need for 2-4 L home O2.  Altered mental status  metabolic encephalopathy Patient went to bed with normal mentation and was unable to arouse morning of admission. Initial concern for stroke when patient arrived to ED via EMS due to flaccid right arm and right sided gaze. Code stroke was called and non the way to the hospital. Imaging for stroke (CT Head, CTA Head and neck) were negative. Code stroke was cancelled and tPA was not given. US abdomen for distended taut abdomen negative for acute process. HIV negative. TSH wnl. A1c 6.7. Blood smear demonstrated neutrophilia due to inflammatory infection, no blasts seen, large platelets present. Fibrinogen 558.   Elevated Troponin  On admission, troponins 198>461. EKG without ST elevation. Felt to be demand ischemia due to septic shock. ECHO demonstrated EF 50-55% with low-normal LV function, mildly enlarged RV, and mild-to-moderate aortic valve sclerosis. Elevated Tropnins were not a NSTEMI per Dr. Antionette Char conversation with the critical care provider.   H/o DVT   History of DVT in September 2020 - L femoral and L popliteal  veins; patient had not filled Xarelto since 2020 per pharmacy records and this was discontinued during this admission  Thrombocytopenia Platelets 97 on admission thought likely related to sepsis. Platelets 124 on discharge.   AKI On admission was 1.67 which increased up to 2.49 during ICU stay, and slowly returned to normal 0.97 at discharge.   Smoking Cessation Wellbutrin 150 mg daily x 3 days (started 11/10) was then increased to 150 mg BID on 11/13.  Chronic conditions of T2DM, HTN, HLD remained stable.   Discharge recommendations:  T2DM - A1c 6.7 on admission. Started Crestor on discharge PCP to follow up and manage.  Smoking cessation very important.  INCIDENTAL: [place] abd on 11/07 found cholelithiasis and hepatic steatosis. PCP to follow.  INCIDENTAL: [place] abd on 11/07 found simple right renal cysts the largest of which measures 8.4 cm. PCP to follow.  Follow up with pulmonology outpatient. COPD started Spirva  Will need sleep study after discharge as there was concern for OSA.  Pt to continue Cefdinir thorough 07/07/21.    Significant Procedures: Echocardiogram showed EF 50 to 55%, mild RV enlargement, trivial mitral valve regurgitation, and mild to moderate aortic valve sclerosis.  Overnight video EEG showing moderate diffuse encephalopathy, but no seizures nor epileptiform changes.  Significant Labs and Imaging:  Recent Labs  Lab 07/05/21 0232 07/06/21 0557 07/08/21 0539  WBC 9.7 9.3 8.9  HGB 17.0 16.0 15.9  HCT 55.7* 51.9 48.6  PLT 114* 122* 124*   Recent Labs  Lab 07/01/21 1850 07/01/21 2056 07/02/21 0010 07/02/21 0136 07/02/21 0334 07/02/21 1127 07/03/21 0419 07/04/21 0419 07/05/21 0232 07/06/21 0557  NA  --    < > 136   < >  --  139 140 146* 144 142  K  --    < > 4.7   < >  --  4.6 4.9 4.9 5.2* 4.3  CL  --   --  100  --   --   --  103 107 107 102  CO2  --   --  24  --   --   --  26 31 29  34*   GLUCOSE  --   --  164*  --   --   --  188* 104* 133* 76  BUN  --   --  64*  --   --   --  98* 78* 60* 39*  CREATININE  --   --  2.49*  --   --   --  2.43* 1.56* 1.20 0.97  CALCIUM  --   --  8.3*  --   --   --  8.3* 8.6* 8.7* 8.6*  MG 2.6*  --  2.7*  --  2.6*  --   --   --   --   --   PHOS 4.8*  --   --   --  3.7  --   --   --   --   --    < > = values in this interval not displayed.     Results/Tests Pending at Time of Discharge: None  Discharge Medications:  Allergies as of 07/08/2021   No Known Allergies      Medication List     STOP taking these medications    budesonide-formoterol 160-4.5 MCG/ACT inhaler Commonly known as: SYMBICORT   ipratropium-albuterol 0.5-2.5 (3) MG/3ML Soln Commonly known as: DUONEB       TAKE these medications    aspirin 81 MG EC tablet Take 1  tablet (81 mg total) by mouth daily.   buPROPion 150 MG 12 hr tablet Commonly known as: WELLBUTRIN SR Take 1 tablet (150 mg total) by mouth 2 (two) times daily.   ibuprofen 200 MG tablet Commonly known as: ADVIL Take 600 mg by mouth every 6 (six) hours as needed for mild pain.   Incruse Ellipta 62.5 MCG/ACT Aepb Generic drug: umeclidinium bromide Inhale 1 puff into the lungs daily. Start taking on: July 09, 2021   metFORMIN 500 MG tablet Commonly known as: GLUCOPHAGE Take 2 (two) times daily with a meal by mouth.   omeprazole 20 MG capsule Commonly known as: PRILOSEC Take 1 capsule (20 mg total) by mouth daily.   rosuvastatin 20 MG tablet Commonly known as: Crestor Take 1 tablet (20 mg total) by mouth daily.   SM Mucus Relief 600 MG 12 hr tablet Generic drug: guaiFENesin Take 1 tablet (600 mg total) by mouth 2 (two) times daily for 7 days.   Trulicity 4.62 VO/3.5KK Sopn Generic drug: Dulaglutide Inject 0.75 mg into the skin once a week. Wednesday               Durable Medical Equipment  (From admission, onward)           Start     Ordered   07/08/21 1222  For  home use only DME Walker rolling  Once       Question Answer Comment  Walker: With [place] Wheels   Patient needs a walker to treat with the following condition Impaired ambulation      07/08/21 1222   07/07/21 0753  For home use only DME oxygen  Once       Question Answer Comment  Length of Need 12 Months   Mode or (Route) Nasal cannula   Liters per Minute 2   Oxygen delivery system Gas      07/07/21 0752            Discharge Instructions: Please refer to Patient Instructions section of EMR for full details.  Patient was counseled important signs and symptoms that should prompt return to medical care, changes in medications, dietary instructions, activity restrictions, and follow up appointments.   Follow-Up Appointments:  Follow-up Information     [facility] Follow up.   Specialty: Home Health Services Why: Registered Nurse, Physical Therapy, Occupational Therapy, Aide-office to call with visit times Contact information: [place] [address] [phone]         [facility] Follow up.   Why: Oxygen, Rolling Walker with seat, Bedside Commode- to be delivered to the room prior to d/c. Contact information: [place] [address] [phone]         Ward, Carlyon Shadow, FNP. Schedule an appointment as soon as possible for a visit.   Specialty: Pulmonary Disease Contact information: [place] [address] [phone]                 Rosezetta Schlatter, MD 07/08/2021, 3:42 PM  Lyndee Hensen, DO

## 2021-07-06 NOTE — Progress Notes (Signed)
SATURATION QUALIFICATIONS: (This note is used to comply with regulatory documentation for home oxygen)  Patient Saturations on Room Air at Rest = 87%  Patient Saturations on Room Air while Ambulating = NT%  NT due to 87% at rest  Patient Saturations on 2 Liters of oxygen while Ambulating = 93%, on 4L 100%  Please briefly explain why patient needs home oxygen: Pt desaturates on RA at rest.  He needs at least 2 L O2 [place] during gait to maintain sats in the 90s.    Thanks,  Verdene Lennert, PT, DPT  Acute Rehabilitation Ortho Tech Supervisor [phone] pager [phone] office

## 2021-07-06 NOTE — Progress Notes (Signed)
Family Medicine Teaching Service Daily Progress Note Intern Pager: [phone]  Patient name: Manuel Leonard Medical record number: [id number] Date of birth: 11/19/1950 Age: 70 y.o. Gender: male  Primary Care Provider: Ward, Carlyon Shadow, FNP Consultants: CCM,  neurology Code Status: Full  Pt Overview and Major Events to Date:  11/06   Admitted by FPTS early evening 11/07   0630 Admitted to ICU early AM, intubated              0425-0830 precedex infusion             Start levophed, propofol, insulin, fentanyl infusion 11/08   0030 Seizure-like episode with desaturation and bigeminy on EKG             0230 Intermittent arrhythmias              0300 Lethargic but responding to commands              0830-1200 EEG: diffuse encephalopathy w/o seizures or epileptiform changes             Neuro believes tremulousness to be essential tremor              Stop insulin infusion 11/09   Stop levophed, proprofol, fentanyl infusion 1030 Extubated  11/10   Lines removed, home meds restarted, start CPAP 11/11   0700 Out to floor with FPTS  Assessment and Plan: Patient is a 70 year old male who presented after being found unresponsive with right-sided gaze, returning to [place] from ICU.  PMH of COPD, DVT anticoagulated on Xarelto in 2020, HTN, HLD, T2DM, and tobacco use disorder-severe/dependent.  Acute hypercarbic respiratory failure  COPD  influenza A Patient intubated x2 days while in ICU, due to persistent hypercarbia despite Solu-Medrol, DuoNebs, BiPAP with increasing pressures.  Extubated 11/9; currently 95 to 96% O2 sat on 4 L nasal cannula.  Patient used CPAP with flow rate of 4 last night.  CTX x7 days and Tamiflu x5 days.  Blood cultures shows no growth x5 days. Procalcitonin 0.24, from 5.6. - On day 6/7 cefdinir 300 mg twice daily; Tamiflu complete -Continue Incruse Ellipta 62.5 mcg 1 puff daily and DuoNeb 3 mL every 2 hours as needed wheezing and dyspnea - Continue supplemental O2 as  needed for O2 > 90. -Ambulatory desaturation study: showed need for 4 L home O2. PT to retest for recommendations for ambulatory desaturation O2 requirements.   T2DM AM CBG 74; 99 on recheck  - Discontinue Levemir, as patient has not eaten much while inpatient 2/2 dislike of food - Continue home Metformin 500 mg BID with meals; restart Trulicity once home.   Thrombocytopenia, improving Plt 122, inc from 114.  - Continue to monitor while inpatient  NSTEMI Troponin inc from 198 to 438. No ST elevation, so likely demand ischemia 2/2 septic shock. ECHO with EF 50-55%. - Continue aspirin 81 mg daily.   Smoking Cessation - Continue Wellbutrin 150 mg daily x 3 days (started 11/10), then increase to 150 mg BID [facility] problems resolved/ no longer managed during this admission: AKI Leukocytosis H/o DVT   FEN/GI: Heart healthy/ carb modified diet PPx: Lovenox Dispo:Home, pending PT/OT recs.  Subjective:  Patient reports no pain. He has been using his incentive spirometer appropriately.   Objective: Temp:  [97.5 F (36.4 C)-98.6 F (37 C)] 98 F (36.7 C) (11/12 0300) Pulse Rate:  [65-97] 73 (11/12 0600) Resp:  [12-28] 21 (11/12 0600) BP: (126-157)/(70-88) 128/74 (11/12 0400) SpO2:  [86 %-97 %]  95 % (11/12 0600) FiO2 (%):  [40 %] 40 % (11/11 0841) Weight:  [588 kg] 138 kg (11/12 0700) Physical Exam: General: Obese, chronically ill appearing male in mild distress. Cardiovascular: RRR, but auscultation limited by body habitus Respiratory: Initially breathing comfortably, but then shows mildly inc work of breathing on 4 L [place]. CTA anteriorly; patient unable to lean forward to auscultate posteriorly. Abdomen: Obese, NTND Extremities: Non-edematous BLE, ecchymoses present; no tenderness, improved from admission.   Laboratory: Recent Labs  Lab 07/04/21 0419 07/05/21 0232 07/06/21 0557  WBC 11.5* 9.7 9.3  HGB 15.8 17.0 16.0  HCT 51.0 55.7* 51.9  PLT 110* 114* 122*    Recent Labs  Lab 06/30/21 1506 06/30/21 1528 07/03/21 0419 07/04/21 0419 07/05/21 0232  NA 136   < > 140 146* 144  K 5.5*   < > 4.9 4.9 5.2*  CL 98   < > 103 107 107  CO2 23   < > 26 31 29   BUN 23   < > 98* 78* 60*  CREATININE 1.67*   < > 2.43* 1.56* 1.20  CALCIUM 8.5*   < > 8.3* 8.6* 8.7*  PROT 7.1  --   --   --   --   BILITOT 1.7*  --   --   --   --   ALKPHOS 79  --   --   --   --   ALT 50*  --   --   --   --   AST 67*  --   --   --   --   GLUCOSE 207*   < > 188* 104* 133*   < > = values in this interval not displayed.    Imaging/Diagnostic Tests: None new  Rosezetta Schlatter, MD 07/06/2021, 7:04 AM PGY-1, [place] Intern pager: [phone], text pages welcome

## 2021-07-07 DIAGNOSIS — R4182 Altered mental status, unspecified: Secondary | ICD-10-CM | POA: Diagnosis not present

## 2021-07-07 LAB — GLUCOSE, CAPILLARY
Glucose-Capillary: 115 mg/dL — ABNORMAL HIGH (ref 70–99)
Glucose-Capillary: 128 mg/dL — ABNORMAL HIGH (ref 70–99)

## 2021-07-07 MED ORDER — GUAIFENESIN ER 600 MG PO TB12
600.0000 mg | ORAL_TABLET | Freq: Two times a day (BID) | ORAL | Status: DC
  Administered 2021-07-07 – 2021-07-08 (×2): 600 mg via ORAL
  Filled 2021-07-07 (×2): qty 1

## 2021-07-07 MED ORDER — ONDANSETRON HCL 4 MG PO TABS
4.0000 mg | ORAL_TABLET | Freq: Once | ORAL | Status: AC
  Administered 2021-07-07: 4 mg via ORAL
  Filled 2021-07-07: qty 1

## 2021-07-07 NOTE — Progress Notes (Signed)
Family Medicine Teaching Service Daily Progress Note Intern Pager: [phone]  Patient name: Manuel Leonard Medical record number: [id number] Date of birth: 09-Oct-1950 Age: 70 y.o. Gender: male  Primary Care Provider: Ward, Carlyon Shadow, [place] Consultants: CCM, Neurology Code Status: Full  Pt Overview and Major Events to Date:  11/06   Admitted by FPTS early evening 11/07   Admitted to ICU early AM, intubated, precedex infusion, Seizure-like episode with desaturation and bigeminy on EKG, EEG: diffuse encephalopathy w/o seizures or epileptiform changes   11/09   Extubated  11/10   Lines removed, home meds restarted, start CPAP 11/11   Return to FPTS from ICU  Assessment and Plan: Manuel Leonard is a 70 year-old male who was initially admitted for unresponsiveness with right sided-gaze, found to be Influenza A + (treated with Tamiflu) with acute hypercarbic respiratory failure that is now resolved. PMH significant for COPD, DVT on Xarelto, HTN, HLD, T2DM, tobacco use disorder.  Acute hypercarbic respiratory failure, COPD, Influenza A On 2L [place]. Used CPAP overnight. No worsening signs of infection or respiratory distress. PT recommends HH PT. Ambulatory desaturations with walk test (87%), will require home O2 (at least 2 L O2 [place] during gait)- see documentation by PT.  He moved from bed to chair x2 this AM using walker with Dr. Erin Hearing- tolerated well with expected O2 desat to 80s, recovered well. Discussed SNF vs. Home with [facility] with son, wife will be at home and be able to help pt. Expected discharge tomorrow to home. - S/p CTX x7 days, on day 7/7 Cefdinir 300mg  BID - Continue [place] Ellipta 1 puff daily - Continue Duoneb q2h PRN for wheezing, dyspnea - O2 supplementation as needed  T2DM CBGs in 100-130s. Well-controlled on current regimen. Semglee was d/c'ed due to low food intake. - Continue home Metformin 500mg  BID with meals - CBG q4h, meals and  bedtime  Thrombocytopenia Monitor for signs of bleeding. Plt 122 - Monitor for labs  NSTEMI EKG showed no ST elevation, thought to be demand ischemia 2/2 septic shock.  - Continue ASA 81mg  daily  Tobacco  use Will increase Wellbutrin regimen today (started on 11/10) - Increase Wellbutrin to 150mg  BID today - F/u outpatient with PCP for further management  FEN/GI: Heart healthy/carb modified PPx: Lovenox Dispo:HH PT/OT   Subjective:  Pt has no complaints. Son was present this AM and asked questions about healthy eating, oxygen saturations and home oxygen needs, smoking cessation.  Objective: Temp:  [96.8 F (36 C)-98.1 F (36.7 C)] 96.8 F (36 C) (11/13 0353) Pulse Rate:  [74-110] 94 (11/13 0700) Resp:  [16-27] 23 (11/13 0700) BP: (127-144)/(73-93) 133/73 (11/13 0400) SpO2:  [90 %-99 %] 91 % (11/13 0700) Physical Exam: General: Obese, chronically ill male in no distress Cardiovascular: RRR Respiratory: Limited movement throughout, clear in all fields. On 2 L maintaining SpO2 at rest, desat to 80s with movement from bed to chair with increased work of breathing. Abdomen: Obese Extremities: No edema, well-perfused  Laboratory: Recent Labs  Lab 07/04/21 0419 07/05/21 0232 07/06/21 0557  WBC 11.5* 9.7 9.3  HGB 15.8 17.0 16.0  HCT 51.0 55.7* 51.9  PLT 110* 114* 122*   Recent Labs  Lab 06/30/21 1506 06/30/21 1528 07/04/21 0419 07/05/21 0232 07/06/21 0557  NA 136   < > 146* 144 142  K 5.5*   < > 4.9 5.2* 4.3  CL 98   < > 107 107 102  CO2 23   < > 31 29 34*  BUN  23   < > 78* 60* 39*  CREATININE 1.67*   < > 1.56* 1.20 0.97  CALCIUM 8.5*   < > 8.6* 8.7* 8.6*  PROT 7.1  --   --   --   --   BILITOT 1.7*  --   --   --   --   ALKPHOS 79  --   --   --   --   ALT 50*  --   --   --   --   AST 67*  --   --   --   --   GLUCOSE 207*   < > 104* 133* 76   < > = values in this interval not displayed.     Orvis Brill, DO 07/07/2021, 7:43 AM PGY-1, [place] Intern pager: [phone], text pages welcome

## 2021-07-07 NOTE — Progress Notes (Signed)
FPTS Brief Progress Note  S:Rounded with night RN, no concerns expressed for patient tonight.   O: BP 125/70 (BP Location: Left Arm)   Pulse 100   Temp (!) 97.5 F (36.4 C) (Oral)   Resp (!) 22   Ht 5\' 11"  (1.803 m)   Wt (!) 138 kg   SpO2 95%   BMI 42.43 kg/m    Gen: Asleep Resp: CPAP on, resting comfortably in bed, NAD  A/P: Acute Hypercarbic Respiratory Failure, COPD, Influenza A -Completed cefdinir course today -Encourage ambulation when awake -O2 supplementation prn -Monitor respiratory status  T2DM Stable. Last BG of 128 -Metformin 500 mg BID home dose  Orders reviewed. Labs for AM reviewed, which was adjusted as needed.   Gerrit Heck, MD 07/07/2021, 8:32 PM PGY-1, [place] Medicine Night Resident  Please page [phone] with questions.

## 2021-07-07 NOTE — TOC Initial Note (Signed)
Transition of Care [facility]) - Initial/Assessment Note    Patient Details  Name: Manuel Leonard MRN: [id number] Date of Birth: 05/12/1951  [facility] CM/SW Contact:    Bethena Roys, RN Phone Number: 07/07/2021, 1:29 PM  Clinical Narrative:  Case Manager spoke with patient and son at the bedside. Prior to arrival patient was from home with the spouse. Plan will be to return home with [place]. Son states that he is visiting and may stay at least 1-2 weeks ( he will see how his father progresses). Case Manager answered questions regarding home health services. Patient will need orders for [facility] RN-medications/disease management, PT/OT for evaluation and treatment and aide for bath. Family is agreeable to services-they have no preference. Referral submitted to [facility] and start of care to begin within 24-48 hours post transition home. Durable Medical Equipment (DME) ordered rolling walker, oxygen, and bedside commode via [employer]-to be delivered to the bedside. MD will arrange for outpatient sleep study for CPAP. No further needs identified at this time. Case Manager will continue to follow for additional needs.                 Expected Discharge Plan: [place] Barriers to Discharge: No Barriers Identified   Patient Goals and CMS Choice Patient states their goals for this hospitalization and ongoing recovery are:: to return home with family support   Choice offered to / list presented to :  (Family did not have a preference.)  Expected Discharge Plan and Services Expected Discharge Plan: [place] Choice: Home Health, Durable Medical Equipment Living arrangements for the past 2 months: Single Family Home                 DME Arranged: Oxygen, Bedside commode, Walker rolling DME Agency: [employer] Date DME Agency Contacted: 07/07/21 Time DME Agency Contacted: 3662 Representative spoke with at DME Agency:  [place]: RN, Disease Management, PT, OT, Nurse's Aide HH Agency: [place] Date [place]: 07/07/21 Time HH Agency Contacted: 1300 Representative spoke with at [place]  Prior Living Arrangements/Services Living arrangements for the past 2 months: [place] with:: Spouse Patient language and need for interpreter reviewed:: Yes Do you feel safe going back to the place where you live?: Yes      Need for Family Participation in Patient Care: Yes (Comment) Care giver support system in place?: Yes (comment)   Criminal Activity/Legal Involvement Pertinent to Current Situation/Hospitalization: No - Comment as needed  Activities of Daily Living      Permission Sought/Granted Permission sought to share information with : Facility Sport and exercise psychologist, Tourist information centre manager, Family Supports Permission granted to share information with : Yes, Verbal Permission Granted     Permission granted to share info w AGENCY: [employer], [facility]        Emotional Assessment Appearance:: Appears stated age Attitude/Demeanor/Rapport: Engaged Affect (typically observed): Appropriate Orientation: : Oriented to Place, Oriented to  Time, Oriented to Situation, Oriented to Self Alcohol / Substance Use: Not Applicable Psych Involvement: No (comment)  Admission diagnosis:  Hypercarbia [R06.89] Hypoxia [R09.02] Fever, unspecified fever cause [R50.9] Altered mental status, unspecified altered mental status type [R41.82] AMS (altered mental status) [R41.82] Patient Active Problem List   Diagnosis Date Noted   Hypoxia    Encounter for central line placement    AMS (altered mental status) 06/30/2021   Constipation 06/27/2020  History of DVT of lower extremity 06/27/2020   Other specified glaucoma 06/27/2020   Morbid obesity ([place]) 12/07/2017   Chest pain in adult 12/06/2017   Chest discomfort 12/03/2017   Abnormal EKG 12/03/2017   Hypertension 12/03/2017    Diabetes mellitus ([place]) 12/03/2017   GERD (gastroesophageal reflux disease)    Adenomatous polyps    Acute bronchitis 80/32/1224   Umbilical hernia 82/50/0370   Tobacco abuse 12/28/2015   Hyperlipidemia 12/21/2015   Multiple pulmonary nodules    COPD (chronic obstructive pulmonary disease) ([place]) 09/27/2014   Leukocytosis 09/27/2014   Sepsis ([place]) 09/27/2014   Dyslipidemia    PCP:  Ward, Carlyon Shadow, FNP Pharmacy:   [facility] [phone] - [place], [place] [place] [address] Phone: [phone] Fax: [phone]  [facility] [address] [place] [address] Phone: [phone] Fax: [phone]   Readmission Risk Interventions No flowsheet data found.

## 2021-07-07 NOTE — Progress Notes (Signed)
FPTS Brief Progress Note  S: Patient sleeping at time of encounter with CPAP mask in place.    O: BP 133/80 (BP Location: Left Arm)   Pulse 87   Temp (!) 97.3 F (36.3 C) (Axillary)   Resp 18   Ht 5\' 11"  (1.803 m)   Wt (!) 138 kg   SpO2 96%   BMI 42.43 kg/m   Gen: male lying supine in bed with head of bed raised and CPAP mask on in NAD   A/P: - Orders reviewed. Labs for AM ordered, which was adjusted as needed.   Eulis Foster, MD 07/07/2021, 1:34 AM PGY-3, [facility] Night Resident  Please page [phone] with questions.

## 2021-07-08 ENCOUNTER — Other Ambulatory Visit (HOSPITAL_COMMUNITY): Payer: Self-pay

## 2021-07-08 DIAGNOSIS — R0689 Other abnormalities of breathing: Secondary | ICD-10-CM | POA: Diagnosis not present

## 2021-07-08 LAB — GLUCOSE, CAPILLARY
Glucose-Capillary: 112 mg/dL — ABNORMAL HIGH (ref 70–99)
Glucose-Capillary: 182 mg/dL — ABNORMAL HIGH (ref 70–99)
Glucose-Capillary: 220 mg/dL — ABNORMAL HIGH (ref 70–99)
Glucose-Capillary: 146 mg/dL — ABNORMAL HIGH (ref 70–99)
Glucose-Capillary: 120 mg/dL — ABNORMAL HIGH (ref 70–99)

## 2021-07-08 LAB — CBC
HCT: 48.6 % (ref 39.0–52.0)
Hemoglobin: 15.9 g/dL (ref 13.0–17.0)
MCH: 30.6 pg (ref 26.0–34.0)
MCHC: 32.7 g/dL (ref 30.0–36.0)
MCV: 93.6 fL (ref 80.0–100.0)
Platelets: 124 10*3/uL — ABNORMAL LOW (ref 150–400)
RBC: 5.19 MIL/uL (ref 4.22–5.81)
RDW: 13.6 % (ref 11.5–15.5)
WBC: 8.9 10*3/uL (ref 4.0–10.5)
nRBC: 0 % (ref 0.0–0.2)

## 2021-07-08 MED ORDER — OMEPRAZOLE 20 MG PO CPDR
20.0000 mg | DELAYED_RELEASE_CAPSULE | Freq: Every day | ORAL | 0 refills | Status: DC
  Filled 2021-07-08: qty 30, 30d supply, fill #0

## 2021-07-08 MED ORDER — UMECLIDINIUM BROMIDE 62.5 MCG/ACT IN AEPB
1.0000 | INHALATION_SPRAY | Freq: Every day | RESPIRATORY_TRACT | 0 refills | Status: DC
  Filled 2021-07-08: qty 30, 30d supply, fill #0

## 2021-07-08 MED ORDER — INFLUENZA VAC A&B SA ADJ QUAD 0.5 ML IM PRSY
0.5000 mL | PREFILLED_SYRINGE | INTRAMUSCULAR | Status: DC | PRN
  Filled 2021-07-08: qty 0.5

## 2021-07-08 MED ORDER — SPIRIVA HANDIHALER 18 MCG IN CAPS
18.0000 ug | ORAL_CAPSULE | Freq: Every day | RESPIRATORY_TRACT | 12 refills | Status: DC
  Filled 2021-07-08: qty 30, 30d supply, fill #0

## 2021-07-08 MED ORDER — BUPROPION HCL ER (SR) 150 MG PO TB12
150.0000 mg | ORAL_TABLET | Freq: Two times a day (BID) | ORAL | 0 refills | Status: DC
  Filled 2021-07-08: qty 60, 30d supply, fill #0

## 2021-07-08 MED ORDER — ROSUVASTATIN CALCIUM 20 MG PO TABS
20.0000 mg | ORAL_TABLET | Freq: Every day | ORAL | 11 refills | Status: DC
  Filled 2021-07-08: qty 30, 30d supply, fill #0

## 2021-07-08 MED ORDER — GUAIFENESIN ER 600 MG PO TB12
600.0000 mg | ORAL_TABLET | Freq: Two times a day (BID) | ORAL | 0 refills | Status: AC
  Filled 2021-07-08: qty 14, 7d supply, fill #0

## 2021-07-08 NOTE — Progress Notes (Signed)
Physical Therapy Treatment Patient Details Name: Manuel Leonard MRN: [id number] DOB: 1951-04-14 Today's Date: 07/08/2021   History of Present Illness The pt is a 70 yo male presenting 06/30/21 after family found the pt unresponsive with R-sided gaze. Code stroke activated, but imaging was negative for acute infarct, AMS thought to be due to hypercapnia and sepsis related to CAP and flu. Pt intubated 11/7 and extubated 11/9. PMH includes: tobacco use, COPD, afib on Xarelto, HTN, HLD, DM II, and obesity.    PT Comments    Pt making progress with mobility. Family present throughout. Answered questions about activity at home, use of home O2, getting in/out of car, and home health PT/OT.    Recommendations for follow up therapy are one component of a multi-disciplinary discharge planning process, led by the attending physician.  Recommendations may be updated based on patient status, additional functional criteria and insurance authorization.  Follow Up Recommendations  Home health PT     Assistance Recommended at Discharge Frequent or constant Supervision/Assistance  Equipment Recommendations  Rollator (4 wheels);BSC/3in1;Other (comment)    Recommendations for Other Services       Precautions / Restrictions Precautions Precautions: Fall Precaution Comments: watch O2     Mobility  Bed Mobility               General bed mobility comments: Pt up in chair    Transfers Overall transfer level: Needs assistance Equipment used: Rollator (4 wheels) Transfers: Sit to/from Stand Sit to Stand: Min guard           General transfer comment: Assist for safety and verbal cues for hand placement    Ambulation/Gait Ambulation/Gait assistance: Min guard Gait Distance (Feet): 140 Feet Assistive device: Rollator (4 wheels) Gait Pattern/deviations: Step-through pattern;Decreased stride length;Trunk flexed Gait velocity: decreased Gait velocity interpretation: <1.31 ft/sec,  indicative of household ambulator   General Gait Details: Assist for safety and lines. Pt with incr trunk flexion as he fatigues.   Stairs Stairs: Yes Stairs assistance: Min assist;+2 safety/equipment Stair Management: Step to pattern;Forwards (Used portable step and countertop to simulate rails.) Number of Stairs: 1 General stair comments: Assist for balance and support   Wheelchair Mobility    Modified Rankin (Stroke Patients Only)       Balance Overall balance assessment: Needs assistance Sitting-balance support: Feet supported;No upper extremity supported Sitting balance-Leahy Scale: Fair     Standing balance support: Bilateral upper extremity supported;Reliant on assistive device for balance Standing balance-Leahy Scale: Poor Standing balance comment: UE support and min guard for static standing                            Cognition Arousal/Alertness: Awake/alert Behavior During Therapy: WFL for tasks assessed/performed Overall Cognitive Status: Within Functional Limits for tasks assessed                                          Exercises      General Comments        Pertinent Vitals/Pain Pain Assessment: No/denies pain    Home Living                          Prior Function            PT Goals (current goals can now be found in the care plan  section) Acute Rehab PT Goals Patient Stated Goal: to return home Progress towards PT goals: Progressing toward goals    Frequency    Min 3X/week      PT Plan Current plan remains appropriate    Co-evaluation              AM-PAC PT "6 Clicks" Mobility   Outcome Measure  Help needed turning from your back to your side while in a flat bed without using bedrails?: A Lot Help needed moving from lying on your back to sitting on the side of a flat bed without using bedrails?: A Lot Help needed moving to and from a bed to a chair (including a wheelchair)?: A  Little Help needed standing up from a chair using your arms (e.g., wheelchair or bedside chair)?: A Little Help needed to walk in hospital room?: A Little Help needed climbing 3-5 steps with a railing? : A Little 6 Click Score: 16    End of Session Equipment Utilized During Treatment: Oxygen Activity Tolerance: Patient tolerated treatment well Patient left: in chair;with call bell/phone within reach;with family/visitor present Nurse Communication: Mobility status PT Visit Diagnosis: Other abnormalities of gait and mobility (R26.89)     Time: 6144-3154 PT Time Calculation (min) (ACUTE ONLY): 32 min  Charges:  $Gait Training: 23-37 mins                     [place] Pager [phone] Office [place] 07/08/2021, 12:01 PM

## 2021-07-08 NOTE — TOC Transition Note (Signed)
Transition of Care [facility]) - CM/SW Discharge Note   Patient Details  Name: Manuel Leonard MRN: [id number] Date of Birth: Mar 26, 1951  [facility] CM/SW Contact:  Bethena Roys, RN Phone Number: 07/08/2021, 12:01 PM   Clinical Narrative: Durable Medical Equipment (DME) will be delivered to the room. Family is in the room for transition home- all questions answered. Staff RN did call regarding rolling walker and patient needing rollator. [employer] is aware to deliver once the order has been changed. Family to transport patient home via private vehicle. Alvis Lemmings is aware to call the son Lino for visit times.  No further needs from Case Manager at this time.   Final next level of care: [place] Barriers to Discharge: No Barriers Identified   Patient Goals and CMS Choice Patient states their goals for this hospitalization and ongoing recovery are:: to return home with family support   Choice offered to / list presented to :  (Family did not have a preference.)  Discharge Plan and Services Post Acute Care Choice: Home Health, Durable Medical Equipment          DME Arranged: Oxygen, Bedside commode, Walker rolling DME Agency: [employer] Date DME Agency Contacted: 07/07/21 Time DME Agency Contacted: [id number] Representative spoke with at DME Agency: [place]: RN, Disease Management, PT, OT, Nurse's Aide HH Agency: [place] Date [place]: 07/07/21 Time [place]: 1300 Representative spoke with at [place]: Tommi Rumps   Readmission Risk Interventions No flowsheet data found.

## 2021-07-08 NOTE — Discharge Planning (Signed)
Patient given discharge instructions and verbalizes understanding. Patient taken to car via [place]. Son at bedside. Patient with no complaints at the current time.

## 2021-07-08 NOTE — TOC Benefit Eligibility Note (Signed)
Patient Advocate Encounter   Received notification that prior authorization for Spriva Handihaler 18 mcg is required.   PA submitted on 07/08/2021 Key [id number] Status is pending       Lyndel Safe, [place] Patient Advocate Specialist [place] Patient Advocate Team Direct Number: [phone]  Fax: [phone]

## 2021-07-08 NOTE — Progress Notes (Signed)
Patient placed on CPAP HS. 4L O2 bleed in needed. Patient tolerating well at this time.

## 2021-07-08 NOTE — Progress Notes (Signed)
Focus of session on educating pt and family in energy conservation strategies. Pt mobilizing with min guard assist. Demonstrated ability to access feet for bathing and dressing using figure 4 method. Pt with excellent family involvement.    07/08/21 1500  OT Visit Information  Last OT Received On 07/08/21  Assistance Needed +1  History of Present Illness The pt is a 70 yo male presenting 06/30/21 after family found the pt unresponsive with R-sided gaze. Code stroke activated, but imaging was negative for acute infarct, AMS thought to be due to hypercapnia and sepsis related to CAP and flu. Pt intubated 11/7 and extubated 11/9. PMH includes: tobacco use, COPD, afib on Xarelto, HTN, HLD, DM II, and obesity.  Precautions  Precautions Fall  Precaution Comments watch O2  Pain Assessment  Pain Assessment No/denies pain  Cognition  Arousal/Alertness Awake/alert  Behavior During Therapy WFL for tasks assessed/performed  Overall Cognitive Status Within Functional Limits for tasks assessed  General Comments son interpreting, wife and 2 sons in room for education  ADL  Overall ADL's  Needs assistance/impaired  Grooming Min guard;Standing  Lower Body Bathing Min guard;Sit to/from stand  Lower Body Dressing Min guard;Sit to/from stand  Functional mobility during ADLs Min guard;Rollator (4 wheels)  General ADL Comments Pt able to cross foot over opposite knee to don socks, wash feet. Recommended long handled bath sponge and use of 02 in shower in sitting.Educated in energy conservation techniques and provided Transport planner.  Bed Mobility  General bed mobility comments Pt up in chair  Transfers  Overall transfer level Needs assistance  Equipment used Rollator (4 wheels)  Transfers Sit to/from Stand  Sit to Stand Min guard  Balance  Overall balance assessment Needs assistance  Sitting balance-Leahy Scale Good  Sitting balance - Comments no LOB donning socks  Standing balance support Bilateral  upper extremity supported;Reliant on assistive device for balance  Standing balance-Leahy Scale Poor  Standing balance comment UE support and min guard for static standing  OT - End of Session  Equipment Utilized During Treatment Rolling walker (2 wheels);Oxygen  Activity Tolerance Patient tolerated treatment well  Patient left with call bell/phone within reach;in chair;with family/visitor present  OT Assessment/Plan  OT Plan Discharge plan remains appropriate  OT Visit Diagnosis Unsteadiness on feet (R26.81);Other abnormalities of gait and mobility (R26.89);Muscle weakness (generalized) (M62.81);Other (comment) (decreased activity tolerance)  OT Frequency (ACUTE ONLY) Min 2X/week  Follow Up Recommendations Home health OT  Assistance recommended at discharge Intermittent Supervision/Assistance  OT Equipment None recommended by OT  AM-PAC OT "6 Clicks" Daily Activity Outcome Measure (Version 2)  Help from another person eating meals? 4  Help from another person taking care of personal grooming? 3  Help from another person toileting, which includes using toliet, bedpan, or urinal? 3  Help from another person bathing (including washing, rinsing, drying)? 3  Help from another person to put on and taking off regular upper body clothing? 4  Help from another person to put on and taking off regular lower body clothing? 3  6 Click Score 20  Progressive Mobility  What is the highest level of mobility based on the progressive mobility assessment? Level 4 (Walks with assist in room) - Balance while marching in place and cannot step forward and back - Complete  Mobility Ambulated with assistance in room  OT Goal Progression  Progress towards OT goals Progressing toward goals  Acute Rehab OT Goals  OT Goal Formulation With patient  Time For Goal Achievement 07/19/21  Potential to Achieve Goals Good  OT Time Calculation  OT Start Time (ACUTE ONLY) 1502  OT Stop Time (ACUTE ONLY) 1515  OT Time  Calculation (min) 13 min  OT Treatments  $Self Care/Home Management  8-22 mins  Nestor Lewandowsky, OTR/L Acute Rehabilitation Services Pager: [phone] Office: [phone]

## 2021-07-08 NOTE — Discharge Instructions (Addendum)
Please schedule an appointment with your primary care physician upon discharge. Referrals have been placed for the following: pulmonology and sleep study. Please be on the lookout for calls to schedule those appointments.   Medications:  START Wellbutrin 150 mg twice daily, Mucinex 600 mg twice daily (until 11/21), Prilosec 20 mg daily, Crestor 20 mg daily, and Incruse Ellipta 62.5- 1 puff daily.  CONTINUE aspirin 81 mg daily, Metformin 500 mg twice daily, and Trulicity 3.49 mg every Wednesday.  Your new oxygen requirement: 2-4 L oxygen via nasal cannula when walking and at rest.   If you have worsening of symptoms (shortness of breath, chest pain, fever, etc.), please call 911 or visit the nearest ED.

## 2021-07-10 DIAGNOSIS — Z86718 Personal history of other venous thrombosis and embolism: Secondary | ICD-10-CM | POA: Diagnosis not present

## 2021-07-10 DIAGNOSIS — K760 Fatty (change of) liver, not elsewhere classified: Secondary | ICD-10-CM | POA: Diagnosis not present

## 2021-07-10 DIAGNOSIS — D696 Thrombocytopenia, unspecified: Secondary | ICD-10-CM | POA: Diagnosis not present

## 2021-07-10 DIAGNOSIS — I878 Other specified disorders of veins: Secondary | ICD-10-CM | POA: Diagnosis not present

## 2021-07-10 DIAGNOSIS — Q6102 Congenital multiple renal cysts: Secondary | ICD-10-CM | POA: Diagnosis not present

## 2021-07-10 DIAGNOSIS — I214 Non-ST elevation (NSTEMI) myocardial infarction: Secondary | ICD-10-CM | POA: Diagnosis not present

## 2021-07-10 DIAGNOSIS — J9601 Acute respiratory failure with hypoxia: Secondary | ICD-10-CM | POA: Diagnosis not present

## 2021-07-10 DIAGNOSIS — K219 Gastro-esophageal reflux disease without esophagitis: Secondary | ICD-10-CM | POA: Diagnosis not present

## 2021-07-10 DIAGNOSIS — I10 Essential (primary) hypertension: Secondary | ICD-10-CM | POA: Diagnosis not present

## 2021-07-10 DIAGNOSIS — K8020 Calculus of gallbladder without cholecystitis without obstruction: Secondary | ICD-10-CM | POA: Diagnosis not present

## 2021-07-10 DIAGNOSIS — R918 Other nonspecific abnormal finding of lung field: Secondary | ICD-10-CM | POA: Diagnosis not present

## 2021-07-10 DIAGNOSIS — H4089 Other specified glaucoma: Secondary | ICD-10-CM | POA: Diagnosis not present

## 2021-07-10 DIAGNOSIS — N179 Acute kidney failure, unspecified: Secondary | ICD-10-CM | POA: Diagnosis not present

## 2021-07-10 DIAGNOSIS — F17210 Nicotine dependence, cigarettes, uncomplicated: Secondary | ICD-10-CM | POA: Diagnosis not present

## 2021-07-10 DIAGNOSIS — E785 Hyperlipidemia, unspecified: Secondary | ICD-10-CM | POA: Diagnosis not present

## 2021-07-10 DIAGNOSIS — J9602 Acute respiratory failure with hypercapnia: Secondary | ICD-10-CM | POA: Diagnosis not present

## 2021-07-10 DIAGNOSIS — Z89021 Acquired absence of right finger(s): Secondary | ICD-10-CM | POA: Diagnosis not present

## 2021-07-10 DIAGNOSIS — D751 Secondary polycythemia: Secondary | ICD-10-CM | POA: Diagnosis not present

## 2021-07-10 DIAGNOSIS — E875 Hyperkalemia: Secondary | ICD-10-CM | POA: Diagnosis not present

## 2021-07-10 DIAGNOSIS — Z8701 Personal history of pneumonia (recurrent): Secondary | ICD-10-CM | POA: Diagnosis not present

## 2021-07-10 DIAGNOSIS — J439 Emphysema, unspecified: Secondary | ICD-10-CM | POA: Diagnosis not present

## 2021-07-10 DIAGNOSIS — Z6841 Body Mass Index (BMI) 40.0 and over, adult: Secondary | ICD-10-CM | POA: Diagnosis not present

## 2021-07-10 DIAGNOSIS — G9341 Metabolic encephalopathy: Secondary | ICD-10-CM | POA: Diagnosis not present

## 2021-07-10 DIAGNOSIS — E119 Type 2 diabetes mellitus without complications: Secondary | ICD-10-CM | POA: Diagnosis not present

## 2021-07-11 DIAGNOSIS — N179 Acute kidney failure, unspecified: Secondary | ICD-10-CM | POA: Diagnosis not present

## 2021-07-11 DIAGNOSIS — I214 Non-ST elevation (NSTEMI) myocardial infarction: Secondary | ICD-10-CM | POA: Diagnosis not present

## 2021-07-11 DIAGNOSIS — J439 Emphysema, unspecified: Secondary | ICD-10-CM | POA: Diagnosis not present

## 2021-07-11 DIAGNOSIS — J9602 Acute respiratory failure with hypercapnia: Secondary | ICD-10-CM | POA: Diagnosis not present

## 2021-07-11 DIAGNOSIS — G9341 Metabolic encephalopathy: Secondary | ICD-10-CM | POA: Diagnosis not present

## 2021-07-11 DIAGNOSIS — J9601 Acute respiratory failure with hypoxia: Secondary | ICD-10-CM | POA: Diagnosis not present

## 2021-07-11 NOTE — Telephone Encounter (Signed)
In lab sleep study placed.   Call made to patient, confirmed DOB. Made aware of Hospital follow up appt. Made aware someone from sleep lab would be contacting them to schedule the sleep study. Voiced understanding.   Nothing further needed at this time. Will route message back to provider as FYI that everything has been taken care of.   Will close encounter.

## 2021-07-15 DIAGNOSIS — N179 Acute kidney failure, unspecified: Secondary | ICD-10-CM | POA: Diagnosis not present

## 2021-07-15 DIAGNOSIS — A419 Sepsis, unspecified organism: Secondary | ICD-10-CM | POA: Diagnosis not present

## 2021-07-15 DIAGNOSIS — D696 Thrombocytopenia, unspecified: Secondary | ICD-10-CM | POA: Diagnosis not present

## 2021-07-15 DIAGNOSIS — Z716 Tobacco abuse counseling: Secondary | ICD-10-CM | POA: Diagnosis not present

## 2021-07-15 DIAGNOSIS — I959 Hypotension, unspecified: Secondary | ICD-10-CM | POA: Diagnosis not present

## 2021-07-15 DIAGNOSIS — G4733 Obstructive sleep apnea (adult) (pediatric): Secondary | ICD-10-CM | POA: Diagnosis not present

## 2021-07-15 DIAGNOSIS — E119 Type 2 diabetes mellitus without complications: Secondary | ICD-10-CM | POA: Diagnosis not present

## 2021-07-15 DIAGNOSIS — Z7984 Long term (current) use of oral hypoglycemic drugs: Secondary | ICD-10-CM | POA: Diagnosis not present

## 2021-07-15 DIAGNOSIS — J441 Chronic obstructive pulmonary disease with (acute) exacerbation: Secondary | ICD-10-CM | POA: Diagnosis not present

## 2021-07-16 DIAGNOSIS — J9601 Acute respiratory failure with hypoxia: Secondary | ICD-10-CM | POA: Diagnosis not present

## 2021-07-16 DIAGNOSIS — G9341 Metabolic encephalopathy: Secondary | ICD-10-CM | POA: Diagnosis not present

## 2021-07-16 DIAGNOSIS — I214 Non-ST elevation (NSTEMI) myocardial infarction: Secondary | ICD-10-CM | POA: Diagnosis not present

## 2021-07-16 DIAGNOSIS — N179 Acute kidney failure, unspecified: Secondary | ICD-10-CM | POA: Diagnosis not present

## 2021-07-16 DIAGNOSIS — J9602 Acute respiratory failure with hypercapnia: Secondary | ICD-10-CM | POA: Diagnosis not present

## 2021-07-16 DIAGNOSIS — J439 Emphysema, unspecified: Secondary | ICD-10-CM | POA: Diagnosis not present

## 2021-07-17 DIAGNOSIS — J439 Emphysema, unspecified: Secondary | ICD-10-CM | POA: Diagnosis not present

## 2021-07-17 DIAGNOSIS — I214 Non-ST elevation (NSTEMI) myocardial infarction: Secondary | ICD-10-CM | POA: Diagnosis not present

## 2021-07-17 DIAGNOSIS — G9341 Metabolic encephalopathy: Secondary | ICD-10-CM | POA: Diagnosis not present

## 2021-07-17 DIAGNOSIS — J9601 Acute respiratory failure with hypoxia: Secondary | ICD-10-CM | POA: Diagnosis not present

## 2021-07-17 DIAGNOSIS — J9602 Acute respiratory failure with hypercapnia: Secondary | ICD-10-CM | POA: Diagnosis not present

## 2021-07-17 DIAGNOSIS — N179 Acute kidney failure, unspecified: Secondary | ICD-10-CM | POA: Diagnosis not present

## 2021-07-22 DIAGNOSIS — J439 Emphysema, unspecified: Secondary | ICD-10-CM | POA: Diagnosis not present

## 2021-07-22 DIAGNOSIS — N179 Acute kidney failure, unspecified: Secondary | ICD-10-CM | POA: Diagnosis not present

## 2021-07-22 DIAGNOSIS — J9602 Acute respiratory failure with hypercapnia: Secondary | ICD-10-CM | POA: Diagnosis not present

## 2021-07-22 DIAGNOSIS — G9341 Metabolic encephalopathy: Secondary | ICD-10-CM | POA: Diagnosis not present

## 2021-07-22 DIAGNOSIS — I214 Non-ST elevation (NSTEMI) myocardial infarction: Secondary | ICD-10-CM | POA: Diagnosis not present

## 2021-07-22 DIAGNOSIS — J9601 Acute respiratory failure with hypoxia: Secondary | ICD-10-CM | POA: Diagnosis not present

## 2021-07-23 DIAGNOSIS — R0681 Apnea, not elsewhere classified: Secondary | ICD-10-CM | POA: Diagnosis not present

## 2021-07-23 DIAGNOSIS — I214 Non-ST elevation (NSTEMI) myocardial infarction: Secondary | ICD-10-CM | POA: Diagnosis not present

## 2021-07-23 DIAGNOSIS — J9602 Acute respiratory failure with hypercapnia: Secondary | ICD-10-CM | POA: Diagnosis not present

## 2021-07-23 DIAGNOSIS — G9341 Metabolic encephalopathy: Secondary | ICD-10-CM | POA: Diagnosis not present

## 2021-07-23 DIAGNOSIS — J9601 Acute respiratory failure with hypoxia: Secondary | ICD-10-CM | POA: Diagnosis not present

## 2021-07-23 DIAGNOSIS — N179 Acute kidney failure, unspecified: Secondary | ICD-10-CM | POA: Diagnosis not present

## 2021-07-23 DIAGNOSIS — J439 Emphysema, unspecified: Secondary | ICD-10-CM | POA: Diagnosis not present

## 2021-07-24 DIAGNOSIS — N179 Acute kidney failure, unspecified: Secondary | ICD-10-CM | POA: Diagnosis not present

## 2021-07-24 DIAGNOSIS — J439 Emphysema, unspecified: Secondary | ICD-10-CM | POA: Diagnosis not present

## 2021-07-24 DIAGNOSIS — G9341 Metabolic encephalopathy: Secondary | ICD-10-CM | POA: Diagnosis not present

## 2021-07-24 DIAGNOSIS — J9601 Acute respiratory failure with hypoxia: Secondary | ICD-10-CM | POA: Diagnosis not present

## 2021-07-24 DIAGNOSIS — J9602 Acute respiratory failure with hypercapnia: Secondary | ICD-10-CM | POA: Diagnosis not present

## 2021-07-24 DIAGNOSIS — I214 Non-ST elevation (NSTEMI) myocardial infarction: Secondary | ICD-10-CM | POA: Diagnosis not present

## 2021-07-25 DIAGNOSIS — J439 Emphysema, unspecified: Secondary | ICD-10-CM | POA: Diagnosis not present

## 2021-07-25 DIAGNOSIS — J9601 Acute respiratory failure with hypoxia: Secondary | ICD-10-CM | POA: Diagnosis not present

## 2021-07-25 DIAGNOSIS — N179 Acute kidney failure, unspecified: Secondary | ICD-10-CM | POA: Diagnosis not present

## 2021-07-25 DIAGNOSIS — I214 Non-ST elevation (NSTEMI) myocardial infarction: Secondary | ICD-10-CM | POA: Diagnosis not present

## 2021-07-25 DIAGNOSIS — G9341 Metabolic encephalopathy: Secondary | ICD-10-CM | POA: Diagnosis not present

## 2021-07-25 DIAGNOSIS — J9602 Acute respiratory failure with hypercapnia: Secondary | ICD-10-CM | POA: Diagnosis not present

## 2021-07-26 ENCOUNTER — Other Ambulatory Visit (HOSPITAL_BASED_OUTPATIENT_CLINIC_OR_DEPARTMENT_OTHER): Payer: Self-pay

## 2021-07-26 DIAGNOSIS — R0681 Apnea, not elsewhere classified: Secondary | ICD-10-CM

## 2021-07-29 NOTE — Progress Notes (Addendum)
@Patient  ID: Manuel Leonard, male    DOB: 28-Aug-1950, 70 y.o.   MRN: [id number]  Chief Complaint  Patient presents with   Hospitalization Follow-up    Sob with exertion.  Nausea and vomiting since out of hospital possibly d/t Wellbutrin.  Cough with yellow mucous at night.  Shaking.    Referring provider: Ward, Carlyon Shadow, FNP  HPI: 70 year old male, everyday smoker (48 pack years) followed for COPD and multiple pulmonary nodules.  He is a patient of Dr. Agustina Caroli and was last seen in the hospital by Dr. Valeta Harms on 07/05/2021.  Past medical history significant for atrial fibrillation, hypertension, hx of DVT (previously tx with Xarelto), hyperlipidemia, diabetes, tobacco abuse, GERD, morbid obesity.  TEST/EVENTS:  01/01/2016 CT chest without contrast: No lymphadenopathy.  Peribronchial thickening.  Resolution of the miliary nodular appearance throughout both lungs on the previous exam compatible with resolved peribronchiolar vascular infiltrate.  No residual infiltrate, pleural effusion, pneumothorax or discrete pulmonary nodule.  Tiny focus of persistent pleural nodularity at left major fissure, measuring 2 mm diameter, stable since 2015. 06/30/2021 CTA chest: No PE identified.  Atherosclerosis present.  No lymphadenopathy.  No pulmonary mass, infiltrate or effusion. 07/02/2019: Echocardiogram: EF 50 to 55% with low to normal LV function, mildly enlarged RV and mild to moderate aortic valve stenosis 07/04/2019 CXR 1 view: Lungs clear without evidence of focal consolidation or large pleural effusion.  06/30/2021 -07/08/2021: Hospital admission for sepsis and acute hypoxic and hypercapnic respiratory failure due to influenza A pneumonia.  Initially presented to ED after being found unresponsive and hypoxic to 70% on room air.  CT head negative.  Troponins elevated; cards ruled no NSTEMI present. Decreased mentation felt secondary to hypercapnia.  Initial ABG 7.26/70.2/450/31.  Required intubation  on 11/7, extubated on 11/9.  positive for flu A.  Thought to be septic secondary to CAP.  Treated with Tamiflu and IV Rocephin.  Outpatient sleep study recommended.  Nighttime CPAP.  Ambulatory pulse oximetry showed need for 2 to 4 L home O2.  Discharged on Wellbutrin for smoking cessation.  07/30/2021: Today - hospital follow up Patient presents today with his two daughters and his brother. He was recently admitted for sepsis and acute hypoxic and hypercapnic respiratory failure related to influenza A CAP. He was intubated during his stay and after extubation, weaned to 2L supplemental O2, which he was discharged home on. Today, he reports feeling significantly better. He denies cough or shortness of breath, aside from when he worked with PT and did not have his oxygen on. He has not experienced any orthopnea, PND, chest pain or tightness, wheezing, or lower extremity swelling. He continues on his Incruse ellipta daily and Duoneb nebulizer treatments twice daily. He does report some nausea in the mornings after taking his first Wellbutrin dose. This subsides on it's own. He denies any vomiting. He also experiences a bitter taste in the mornings. He continues on omeprazole 20 mg daily.   He is scheduled for NSPG this Sunday. He currently does not have CPAP or BiPAP therapy at night. He does experience some daytime fatigue symptoms but denies any confusion, decreased level of consciousness, lightheadedness, or narcolepsy. He continues on his supplemental O2 at 2 L and denies any increasing requirements. Overall, he feels well and offers no further complaints.   No Known Allergies  Immunization History  Administered Date(s) Administered   Influenza, High Dose Seasonal PF 07/08/2017   Influenza,inj,Quad PF,6+ Mos 08/26/2011, 07/03/2015, 08/29/2016   PFIZER(Purple Top)SARS-COV-2 Vaccination 10/05/2019, 10/26/2019  Past Medical History:  Diagnosis Date   Abnormal EKG 12/03/2017   Acute bronchitis  12/29/2016   Adenomatous polyps    Chest discomfort 12/03/2017   COPD (chronic obstructive pulmonary disease) (HCC)    Diabetes mellitus ([place]) 12/03/2017   Emphysema/COPD (HCC)    GERD (gastroesophageal reflux disease)    Hyperlipidemia 12/21/2015   Hypertension    not on meds    Leukocytosis 09/27/2014   Multiple pulmonary nodules    Pneumonia    hx of 2017    Sepsis ([place]) 09/27/2014   Tobacco abuse    Umbilical hernia 0/94/7096    Tobacco History: Social History   Tobacco Use  Smoking Status Former   Packs/day: 2.00   Years: 48.00   Pack years: 96.00   Types: Cigarettes   Quit date: 06/30/2021   Years since quitting: 0.0  Smokeless Tobacco Never   Counseling given: Not Answered   Outpatient Medications Prior to Visit  Medication Sig Dispense Refill   aspirin 81 MG EC tablet Take 1 tablet (81 mg total) by mouth daily. 30 tablet 3   ibuprofen (ADVIL) 200 MG tablet Take 600 mg by mouth every 6 (six) hours as needed for mild pain.     metFORMIN (GLUCOPHAGE) 500 MG tablet Take 2 (two) times daily with a meal by mouth.     rosuvastatin (CRESTOR) 20 MG tablet Take 1 tablet (20 mg total) by mouth daily. 30 tablet 11   TRULICITY 2.83 MO/2.9UT SOPN Inject 0.75 mg into the skin once a week. Wednesday     buPROPion (WELLBUTRIN SR) 150 MG 12 hr tablet Take 1 tablet (150 mg total) by mouth 2 (two) times daily. 60 tablet 0   omeprazole (PRILOSEC) 20 MG capsule Take 1 capsule (20 mg total) by mouth daily. 30 capsule 0   umeclidinium bromide (INCRUSE ELLIPTA) 62.5 MCG/ACT AEPB Inhale 1 puff into the lungs daily. 30 each 0   No facility-administered medications prior to visit.     Review of Systems:   Constitutional: No weight loss or gain, night sweats, fevers, chills. +occasional fatigue HEENT: No headaches, difficulty swallowing, tooth/dental problems, or sore throat. No sneezing, itching, ear ache, nasal congestion, or post nasal drip. +bitter taste in AM CV:  No chest pain,  orthopnea, PND, swelling in lower extremities, anasarca, dizziness, palpitations, syncope Resp: +shortness of breath with exertion without oxygen. No excess mucus or change in color of mucus. No productive or non-productive cough. No hemoptysis. No wheezing.  No chest wall deformity GI:  +nausea in AM after first Wellbutrin. No heartburn, abdominal pain, vomiting, diarrhea, change in bowel habits, loss of appetite, bloody stools.  GU: No dysuria, change in color of urine, urgency or frequency.  No flank pain, no hematuria  Skin: No rash, lesions, ulcerations MSK:  No joint pain or swelling.  No decreased range of motion.  No back pain. Neuro: No dizziness or lightheadedness.  Psych: No depression or anxiety. Mood stable.     Physical Exam:  BP 120/80 (BP Location: Right Arm, Patient Position: Sitting, Cuff Size: Large)   Pulse 99   Temp 98.1 F (36.7 C) (Oral)   Ht 5\' 10"  (1.778 m)   Wt 293 lb 6.4 oz (133.1 kg)   SpO2 96%   BMI 42.10 kg/m   GEN: Pleasant, interactive, chronically-ill appearing; morbidly obese; in no acute distress. HEENT:  Normocephalic and atraumatic. EACs patent bilaterally. TM pearly gray with present light reflex bilaterally. PERRLA. Sclera white. Nasal turbinates pink, moist and  patent bilaterally. No rhinorrhea present. Oropharynx erythematous and moist, without exudate or edema. No lesions, ulcerations, or postnasal drip.  NECK:  Supple w/ fair ROM. No JVD present. Normal carotid impulses w/o bruits. Thyroid symmetrical with no goiter or nodules palpated. No lymphadenopathy.   CV: RRR, no m/r/g, no peripheral edema. Pulses intact, +2 bilaterally. No cyanosis, pallor or clubbing. PULMONARY:  Unlabored, regular breathing. Diminished breath sounds posteriorly, clear otherwise bilaterally A&P w/o wheezes/rales/rhonchi. No accessory muscle use. No dullness to percussion. GI: BS present and normoactive. Soft, non-tender to palpation. No organomegaly or masses detected.  No CVA tenderness. MSK: No erythema, warmth or tenderness. Cap refil <2 sec all extrem. No deformities or joint swelling noted.  Neuro: A/Ox3. No focal deficits noted.   Skin: Warm, no lesions or rashe Psych: Normal affect and behavior. Judgement and thought content appropriate.     Lab Results:  CBC    Component Value Date/Time   WBC 8.9 07/08/2021 0539   RBC 5.19 07/08/2021 0539   HGB 15.9 07/08/2021 0539   HCT 48.6 07/08/2021 0539   PLT 124 (L) 07/08/2021 0539   MCV 93.6 07/08/2021 0539   MCH 30.6 07/08/2021 0539   MCHC 32.7 07/08/2021 0539   RDW 13.6 07/08/2021 0539   LYMPHSABS 1.0 07/01/2021 0902   MONOABS 0.5 07/01/2021 0902   EOSABS 0.0 07/01/2021 0902   BASOSABS 0.0 07/01/2021 0902    BMET    Component Value Date/Time   NA 142 07/06/2021 0557   NA 145 (H) 12/07/2017 0914   K 4.3 07/06/2021 0557   CL 102 07/06/2021 0557   CO2 34 (H) 07/06/2021 0557   GLUCOSE 76 07/06/2021 0557   BUN 39 (H) 07/06/2021 0557   BUN 19 12/07/2017 0914   CREATININE 0.97 07/06/2021 0557   CALCIUM 8.6 (L) 07/06/2021 0557   GFRNONAA >60 07/06/2021 0557   GFRAA >60 05/19/2019 1826    BNP    Component Value Date/Time   BNP 212.9 (H) 06/30/2021 1523     Imaging:  07/30/2021: CXR reviewed by me. No acute cardiopulmonary abnormalities. Eventration of the left hemidiaphragm, unchanged from previous exam.   DG Chest 2 View  Result Date: 07/30/2021 CLINICAL DATA:  Community acquired pneumonia.  Follow-up exam. EXAM: CHEST - 2 VIEW COMPARISON:  07/03/2021 FINDINGS: Normal heart size. No pleural effusion, interstitial edema or airspace consolidation. Eventration of the left hemidiaphragm is again noted. Mild spondylosis within the thoracic spine. IMPRESSION: No active cardiopulmonary abnormalities. Electronically Signed   By: Kerby Moors M.D.   On: 07/30/2021 12:13   US Abdomen Complete  Result Date: 07/01/2021 CLINICAL DATA:  Abdominal pain EXAM: ABDOMEN ULTRASOUND COMPLETE  COMPARISON:  08/02/2019 FINDINGS: Gallbladder: Cholelithiasis is again identified. No wall thickening or pericholecystic fluid is noted. Negative sonographic Murphy's sign is seen. Common bile duct: Diameter: 3.8 mm. Liver: Diffusely increased in echogenicity consistent with fatty infiltration. No focal mass is noted. Portal vein is patent on color Doppler imaging with normal direction of blood flow towards the liver. IVC: No abnormality visualized. Pancreas: Not well visualized Spleen: Size and appearance within normal limits. Right Kidney: Length: 16.1 cm. Cysts are noted within the right kidney. The largest of these lies in the upper pole measuring 8.4 cm. No mass lesion or hydronephrosis is noted. Left Kidney: Length: 13.9 cm. Echogenicity within normal limits. No mass or hydronephrosis visualized. Abdominal aorta: No aneurysm visualized. Other findings: None. IMPRESSION: Cholelithiasis and fatty infiltration of the liver. Simple right renal cysts the largest of  which measures 8.4 cm. Electronically Signed   By: Inez Catalina M.D.   On: 07/01/2021 03:08   DG CHEST PORT 1 VIEW  Result Date: 07/03/2021 CLINICAL DATA:  Endotracheal tube placement, respiratory failure EXAM: PORTABLE CHEST 1 VIEW COMPARISON:  Chest radiograph dated July 02, 2021 FINDINGS: Endotracheal tube approximately 5 cm above the carina. Right IJ access central line and feeding tube coursing below the diaphragm are unchanged. Cardiomediastinal silhouette are unchanged. Lungs are clear without evidence of focal consolidation or large pleural effusion. IMPRESSION: Lines and tubes as above. No significant interval change. Electronically Signed   By: Keane Police D.O.   On: 07/03/2021 08:44   DG Chest Port 1 View  Result Date: 07/02/2021 CLINICAL DATA:  Oxygen desaturation EXAM: PORTABLE CHEST 1 VIEW COMPARISON:  07/01/2021 FINDINGS: Endotracheal tube terminates 9 cm above the carina. Enteric tube courses below the diaphragm. Right IJ  venous catheter terminates in the upper SVC. Lungs are essentially clear.  No pleural effusion or pneumothorax. The heart is top-normal in size. IMPRESSION: Endotracheal tube terminates 9 cm above the carina. Additional support apparatus as above. Electronically Signed   By: Julian Hy M.D.   On: 07/02/2021 03:36   DG CHEST PORT 1 VIEW  Result Date: 07/01/2021 CLINICAL DATA:  Central line placement EXAM: PORTABLE CHEST 1 VIEW COMPARISON:  Chest x-ray earlier the same day FINDINGS: Endotracheal tube tip is 6 cm above the carina. Enteric tube tip is below the diaphragm. Interval placement of a right internal jugular line with the tip in the SVC. Cardiomediastinal silhouette is unchanged. Heart size is normal. No focal consolidation identified. No significant pleural effusion. No pneumothorax. IMPRESSION: 1. Medical devices as described. 2. No pneumothorax. Electronically Signed   By: Ofilia Neas M.D.   On: 07/01/2021 09:46   DG CHEST PORT 1 VIEW  Result Date: 07/01/2021 CLINICAL DATA:  70 year old male with a history of respiratory failure EXAM: PORTABLE CHEST 1 VIEW COMPARISON:  Prior chest x-ray yesterday 06/30/2021 FINDINGS: The patient has been intubated. The tip of the endotracheal tube is 3.8 cm above the carina. A gastric tube is present. The tip lies off the field of view, but below the diaphragm and presumably within the stomach. Diffuse bronchitic changes. No pleural effusion, pneumothorax or pulmonary edema. IMPRESSION: 1. The tip of the endotracheal tube is 3.8 cm above the carina. 2. The tip of the gastric tube lies off the field of view but below the diaphragm and presumably within the stomach. 3. Diffuse mild bronchitic changes. Otherwise, no acute cardiopulmonary process. Electronically Signed   By: Jacqulynn Cadet M.D.   On: 07/01/2021 08:19   Overnight EEG with video  Result Date: 07/02/2021 Lora Havens, MD     07/02/2021  2:11 PM Patient Name: Damany Eastman  MRN: [id number] Epilepsy Attending: Lora Havens Referring Physician/Provider: Dr Trevor Mace Duration: 07/02/2021 0513 to 1200 Patient history: 70yo with episode of rhythmic shaking of BUE, L > R noted on stimulation; patient awake and tracking during this episode and calmed/shaking ceased after < 20 seconds. EEG to evaluate for seizure. Level of alertness:  comatose AEDs during EEG study: Propofol Technical aspects: This EEG study was done with scalp electrodes positioned according to the 10-20 International system of electrode placement. Electrical activity was acquired at a sampling rate of 500Hz  and reviewed with a high frequency filter of 70Hz  and a low frequency filter of 1Hz . EEG data were recorded continuously and digitally stored. Description: EEG showed continuous generalized  polymorphic 3 to 6 Hz theta-delta slowing. Hyperventilation and photic stimulation were not performed.   Patient event button was pressed on 07/02/2021 at 0855 and 1107. Patient was noted to have bilateral upper extremity tremor-like movements. Concomitant EEG before, during and after the event did not show any EEG changes to suggest seizure. ABNORMALITY - Continuous slow, generalized IMPRESSION: This study is suggestive of moderate diffuse encephalopathy, nonspecific etiology but could be secondary to sedation. No seizures or epileptiform discharges were seen throughout the recording. Patient event button was pressed on 07/02/2021 at 0855 and 1107 for bilateral upper extremity tremor-like movements without concomitant EEG change.  These episodes are most likely not epileptic. Lora Havens   ECHOCARDIOGRAM COMPLETE  Result Date: 07/01/2021    ECHOCARDIOGRAM REPORT   Patient Name:   IZAC Hagner Date of Exam: 07/01/2021 Medical Rec #:  [id number]            Height:       71.0 in Accession #:    [id number]           Weight:       313.7 lb Date of Birth:  [birth year]             BSA:          2.553 m Patient Age:    50  years             BP:           122/111 mmHg Patient Gender: M                    HR:           81 bpm. Exam Location:  Inpatient Procedure: 2D Echo, Cardiac Doppler, Color Doppler and Intracardiac            Opacification Agent STAT ECHO Indications:    Elevated Troponin  History:        Patient has prior history of Echocardiogram examinations, most                 recent 10/09/2014. COPD; Risk Factors:Dyslipidemia, Hypertension                 and Diabetes.  Sonographer:    Glo Herring Referring Phys: [phone] WHITNEY D HARRIS  Sonographer Comments: Suboptimal apical window and Technically difficult study due to poor echo windows. IMPRESSIONS  1. Left ventricular ejection fraction, by estimation, is 50 to 55%. The left ventricle has low normal function. The left ventricle has no regional wall motion abnormalities. Left ventricular diastolic parameters are indeterminate.  2. Right ventricular systolic function is mildly reduced. The right ventricular size is mildly enlarged.  3. The mitral valve is abnormal. Trivial mitral valve regurgitation.  4. The aortic valve was not well visualized. Aortic valve regurgitation is not visualized. Mild to moderate aortic valve sclerosis/calcification is present, without any evidence of aortic stenosis.  5. The inferior vena cava is dilated in size with >50% respiratory variability, suggesting right atrial pressure of 8 mmHg. FINDINGS  Left Ventricle: Left ventricular ejection fraction, by estimation, is 50 to 55%. The left ventricle has low normal function. The left ventricle has no regional wall motion abnormalities. Definity contrast agent was given IV to delineate the left ventricular endocardial borders. The left ventricular internal cavity size was normal in size. There is no left ventricular hypertrophy. Left ventricular diastolic parameters are indeterminate. Right Ventricle: The right ventricular size is mildly enlarged. Right vetricular wall thickness was  not assessed.  Right ventricular systolic function is mildly reduced. Left Atrium: Left atrial size was not well visualized. Right Atrium: Right atrial size was normal in size. Pericardium: There is no evidence of pericardial effusion. Mitral Valve: The mitral valve is abnormal. There is mild thickening of the mitral valve leaflet(s). There is mild calcification of the mitral valve leaflet(s). Mild mitral annular calcification. Trivial mitral valve regurgitation. Tricuspid Valve: The tricuspid valve is not well visualized. Tricuspid valve regurgitation is mild. Aortic Valve: The aortic valve was not well visualized. Aortic valve regurgitation is not visualized. Mild to moderate aortic valve sclerosis/calcification is present, without any evidence of aortic stenosis. Pulmonic Valve: The pulmonic valve was not well visualized. Pulmonic valve regurgitation is not visualized. Aorta: The aortic root was not well visualized. Venous: The inferior vena cava is dilated in size with greater than 50% respiratory variability, suggesting right atrial pressure of 8 mmHg. IAS/Shunts: The interatrial septum was not well visualized.   Diastology LV e' medial:    6.53 cm/s LV E/e' medial:  7.9 LV e' lateral:   6.09 cm/s LV E/e' lateral: 8.5   AORTA Ao Root diam: 3.00 cm Ao Asc diam:  2.90 cm MITRAL VALVE               TRICUSPID VALVE MV Area (PHT): 7.29 cm    TR Peak grad:   22.5 mmHg MV Decel Time: 104 msec    TR Vmax:        237.00 cm/s MV E velocity: 51.80 cm/s MV A velocity: 60.00 cm/s MV E/A ratio:  0.86 Jenkins Rouge MD Electronically signed by Jenkins Rouge MD Signature Date/Time: 07/01/2021/8:43:39 AM    Final       PFT Results Latest Ref Rng & Units 01/26/2014  FVC-Pre L 2.74  FVC-Predicted Pre % 58  FVC-Post L 3.30  FVC-Predicted Post % 70  Pre FEV1/FVC % % 64  Post FEV1/FCV % % 65  FEV1-Pre L 1.74  FEV1-Predicted Pre % 49  FEV1-Post L 2.16  DLCO uncorrected ml/min/mmHg 25.61  DLCO [facility]% % 79  DLVA Predicted % 107  TLC L 7.17   TLC % Predicted % 102  RV % Predicted % 171    No results found for: NITRICOXIDE      Assessment & Plan:   COPD (chronic obstructive pulmonary disease) Significant improvement. Not experiencing daily symptoms. Compliant with maintenance regimen of Incruse Ellipta daily. Change duonebs to as needed Continues on supplemental home oxygen. Maintaining saturations on 2L. Desaturated to 87% on room air with home PT. Walking oximetry today on room air SpO2 low 92%. Will continue supplemental oxygen with activity with SpO2 goal >88-90%.   Patient Instructions  Doristine Devoid job on quitting smoking! Continue on Wellbutrin 150 Twice daily to help curve cravings.   -Increase omeprazole to 20 mg Twice daily. Take before meals and before Wellbutrin in the AM and PM -Zofran (ondansetron) 4 mg every 8 hours as needed for nausea. Trial the increase of omeprazole before taking the Zofran.   -Continue Incruse 1 puff daily  -Continue albuterol nebs 3 mL every six hours as needed for shortness of breath or wheezing. Notify if increased heart rate or palpitations develop. -Albuterol inhaler 2 puffs every six hours as needed for shortness of breath or wheezing -Continue supplemental oxygen at 2-4L/min with activity for O2 saturation goal >88-90%. Notify of increasing requirements. Walking oximetry today on room air with SpO2 low of 92%.   -Incentive spirometry 10 times an hour  while awake  -Activity as tolerated. Healthy weight management discussed.  NPSG (sleep study) scheduled for this Sunday.   Schedule pulmonary function testing with spirometry and DLCO with follow up after.   Chest x ray today. We will notify you of results.   Follow up in one month with Dr. Lamonte Sakai. If symptoms do not improve or worsen, please contact office for sooner follow up or seek emergency care.   Acute respiratory failure with hypoxia and hypercapnia (HCC) Improved. Will continue supplemental O2 for activity. See above plan.    GERD (gastroesophageal reflux disease) Nausea and bitter taste in AM consistent with possible GERD symptoms. Increase omeprazole to Twice daily. May consider change to Protonix if symptoms persist. Zofran PRN rx if nausea persists despite optimal treatment of GERD. See above plan.  Tobacco abuse Quit approx 1 month ago. Will continue Wellbutrin therapy for total of 3 months. See above plan.  Hypercarbia NSPG Sunday. Will obtain BMET today to assess CO2 level. Continue oxygen at night until NSPG results. Follow up next week to discuss results and further treatment. See above plan.  Hospital discharge follow-up Significantly improved. Labs today. CXR today for pneumonia follow up clear and without acute cardiopulmonary abnormalities; eventration of the left hemidiaphragm noted unchanged from prior exam. Pt notified of results on [place]. See above plan.     Clayton Bibles, NP 07/30/2021  Pt aware and understands NP's role.

## 2021-07-30 ENCOUNTER — Other Ambulatory Visit: Payer: Self-pay

## 2021-07-30 ENCOUNTER — Encounter: Payer: Self-pay | Admitting: Nurse Practitioner

## 2021-07-30 ENCOUNTER — Ambulatory Visit (INDEPENDENT_AMBULATORY_CARE_PROVIDER_SITE_OTHER): Payer: Medicare Other | Admitting: Nurse Practitioner

## 2021-07-30 ENCOUNTER — Ambulatory Visit (INDEPENDENT_AMBULATORY_CARE_PROVIDER_SITE_OTHER): Payer: Medicare Other

## 2021-07-30 VITALS — BP 120/80 | HR 99 | Temp 98.1°F | Ht 70.0 in | Wt 293.4 lb

## 2021-07-30 DIAGNOSIS — R0689 Other abnormalities of breathing: Secondary | ICD-10-CM

## 2021-07-30 DIAGNOSIS — J449 Chronic obstructive pulmonary disease, unspecified: Secondary | ICD-10-CM | POA: Diagnosis not present

## 2021-07-30 DIAGNOSIS — K219 Gastro-esophageal reflux disease without esophagitis: Secondary | ICD-10-CM | POA: Diagnosis not present

## 2021-07-30 DIAGNOSIS — Z09 Encounter for follow-up examination after completed treatment for conditions other than malignant neoplasm: Secondary | ICD-10-CM

## 2021-07-30 DIAGNOSIS — Z720 Tobacco use: Secondary | ICD-10-CM

## 2021-07-30 DIAGNOSIS — R110 Nausea: Secondary | ICD-10-CM

## 2021-07-30 DIAGNOSIS — J9602 Acute respiratory failure with hypercapnia: Secondary | ICD-10-CM | POA: Diagnosis not present

## 2021-07-30 DIAGNOSIS — J9601 Acute respiratory failure with hypoxia: Secondary | ICD-10-CM

## 2021-07-30 DIAGNOSIS — J189 Pneumonia, unspecified organism: Secondary | ICD-10-CM | POA: Diagnosis not present

## 2021-07-30 DIAGNOSIS — J9611 Chronic respiratory failure with hypoxia: Secondary | ICD-10-CM | POA: Insufficient documentation

## 2021-07-30 MED ORDER — ONDANSETRON HCL 4 MG PO TABS: 4.0000 mg | ORAL_TABLET | Freq: Three times a day (TID) | ORAL | 0 refills | Status: DC | PRN

## 2021-07-30 MED ORDER — BUPROPION HCL ER (SR) 150 MG PO TB12: 150.0000 mg | ORAL_TABLET | Freq: Two times a day (BID) | ORAL | 1 refills | Status: DC

## 2021-07-30 MED ORDER — IPRATROPIUM-ALBUTEROL 0.5-2.5 (3) MG/3ML IN SOLN: 3.0000 mL | Freq: Four times a day (QID) | RESPIRATORY_TRACT | 3 refills | Status: DC | PRN

## 2021-07-30 MED ORDER — OMEPRAZOLE 20 MG PO CPDR: 20.0000 mg | DELAYED_RELEASE_CAPSULE | Freq: Two times a day (BID) | ORAL | 0 refills | Status: DC

## 2021-07-30 MED ORDER — ALBUTEROL SULFATE HFA 108 (90 BASE) MCG/ACT IN AERS: 2.0000 | INHALATION_SPRAY | Freq: Four times a day (QID) | RESPIRATORY_TRACT | 2 refills | Status: DC | PRN

## 2021-07-30 MED ORDER — UMECLIDINIUM BROMIDE 62.5 MCG/ACT IN AEPB: 1.0000 | INHALATION_SPRAY | Freq: Every day | RESPIRATORY_TRACT | 2 refills | Status: DC

## 2021-07-30 MED ORDER — ALBUTEROL SULFATE (2.5 MG/3ML) 0.083% IN NEBU: 2.5000 mg | INHALATION_SOLUTION | Freq: Four times a day (QID) | RESPIRATORY_TRACT | 12 refills | Status: DC | PRN

## 2021-07-30 NOTE — Assessment & Plan Note (Addendum)
Significant improvement. Not experiencing daily symptoms. Compliant with maintenance regimen of Incruse Ellipta daily. Change duonebs to as needed Continues on supplemental home oxygen. Maintaining saturations on 2L. Desaturated to 87% on room air with home PT. Walking oximetry today on room air SpO2 low 92%. Will continue supplemental oxygen with activity with SpO2 goal >88-90%.   Patient Instructions  Doristine Devoid job on quitting smoking! Continue on Wellbutrin 150 Twice daily to help curve cravings.   -Increase omeprazole to 20 mg Twice daily. Take before meals and before Wellbutrin in the AM and PM -Zofran (ondansetron) 4 mg every 8 hours as needed for nausea. Trial the increase of omeprazole before taking the Zofran.   -Continue Incruse 1 puff daily  -Continue albuterol nebs 3 mL every six hours as needed for shortness of breath or wheezing. Notify if increased heart rate or palpitations develop. -Albuterol inhaler 2 puffs every six hours as needed for shortness of breath or wheezing -Continue supplemental oxygen at 2-4L/min with activity for O2 saturation goal >88-90%. Notify of increasing requirements. Walking oximetry today on room air with SpO2 low of 92%.   -Incentive spirometry 10 times an hour while awake  -Activity as tolerated. Healthy weight management discussed.  NPSG (sleep study) scheduled for this Sunday.   Schedule pulmonary function testing with spirometry and DLCO with follow up after.   Chest x ray today. We will notify you of results.   Follow up in one month with Dr. Lamonte Sakai. If symptoms do not improve or worsen, please contact office for sooner follow up or seek emergency care.

## 2021-07-30 NOTE — Assessment & Plan Note (Addendum)
Significantly improved. Labs today. CXR today for pneumonia follow up clear and without acute cardiopulmonary abnormalities; eventration of the left hemidiaphragm noted unchanged from prior exam. Pt notified of results on Manuel Leonard. See above plan.

## 2021-07-30 NOTE — Patient Instructions (Addendum)
Great job on quitting smoking! Continue on Wellbutrin 150 Twice daily to help curve cravings.   -Increase omeprazole to 20 mg Twice daily. Take before meals and before Wellbutrin in the AM and PM -Zofran (ondansetron) 4 mg every 8 hours as needed for nausea. Trial the increase of omeprazole before taking the Zofran.   -Continue Incruse 1 puff daily  -Continue albuterol nebs 3 mL every six hours as needed for shortness of breath or wheezing. Notify if increased heart rate or palpitations develop. -Albuterol inhaler 2 puffs every six hours as needed for shortness of breath or wheezing -Continue supplemental oxygen at 2-4L/min at night and with activity for O2 saturation goal >88-90%. Notify of increasing requirements. Walking oximetry today on room air with SpO2 low of 92%.   -Incentive spirometry 10 times an hour while awake  -Activity as tolerated. Healthy weight management discussed.  NPSG (sleep study) scheduled for this Sunday.   Schedule pulmonary function testing with spirometry and DLCO with follow up after.   Chest x ray today. We will notify you of results.   Follow up next week for NSPG results and then in one-two months with Dr. Lamonte Sakai. If symptoms do not improve or worsen, please contact office for sooner follow up or seek emergency care.

## 2021-07-30 NOTE — Assessment & Plan Note (Signed)
Quit approx 1 month ago. Will continue Wellbutrin therapy for total of 3 months. See above plan.

## 2021-07-30 NOTE — Assessment & Plan Note (Signed)
NSPG Sunday. Will obtain BMET today to assess CO2 level. Continue oxygen at night until NSPG results. Follow up next week to discuss results and further treatment. See above plan.

## 2021-07-30 NOTE — Assessment & Plan Note (Signed)
Improved. Will continue supplemental O2 for activity. See above plan.

## 2021-07-30 NOTE — Assessment & Plan Note (Signed)
Nausea and bitter taste in AM consistent with possible GERD symptoms. Increase omeprazole to Twice daily. May consider change to Protonix if symptoms persist. Zofran PRN rx if nausea persists despite optimal treatment of GERD. See above plan.

## 2021-07-31 DIAGNOSIS — J9601 Acute respiratory failure with hypoxia: Secondary | ICD-10-CM | POA: Diagnosis not present

## 2021-07-31 DIAGNOSIS — J439 Emphysema, unspecified: Secondary | ICD-10-CM | POA: Diagnosis not present

## 2021-07-31 DIAGNOSIS — N179 Acute kidney failure, unspecified: Secondary | ICD-10-CM | POA: Diagnosis not present

## 2021-07-31 DIAGNOSIS — J9602 Acute respiratory failure with hypercapnia: Secondary | ICD-10-CM | POA: Diagnosis not present

## 2021-07-31 DIAGNOSIS — I214 Non-ST elevation (NSTEMI) myocardial infarction: Secondary | ICD-10-CM | POA: Diagnosis not present

## 2021-07-31 DIAGNOSIS — G9341 Metabolic encephalopathy: Secondary | ICD-10-CM | POA: Diagnosis not present

## 2021-08-01 ENCOUNTER — Telehealth: Payer: Self-pay | Admitting: Nurse Practitioner

## 2021-08-01 DIAGNOSIS — J9602 Acute respiratory failure with hypercapnia: Secondary | ICD-10-CM | POA: Diagnosis not present

## 2021-08-01 DIAGNOSIS — J9601 Acute respiratory failure with hypoxia: Secondary | ICD-10-CM | POA: Diagnosis not present

## 2021-08-01 DIAGNOSIS — N179 Acute kidney failure, unspecified: Secondary | ICD-10-CM | POA: Diagnosis not present

## 2021-08-01 DIAGNOSIS — G9341 Metabolic encephalopathy: Secondary | ICD-10-CM | POA: Diagnosis not present

## 2021-08-01 DIAGNOSIS — J439 Emphysema, unspecified: Secondary | ICD-10-CM | POA: Diagnosis not present

## 2021-08-01 DIAGNOSIS — I214 Non-ST elevation (NSTEMI) myocardial infarction: Secondary | ICD-10-CM | POA: Diagnosis not present

## 2021-08-02 NOTE — Telephone Encounter (Signed)
Pt's OV and xray results have been printed to be faxed to [facility] in [place] attn. Nothing further needed.

## 2021-08-04 ENCOUNTER — Ambulatory Visit (HOSPITAL_BASED_OUTPATIENT_CLINIC_OR_DEPARTMENT_OTHER): Payer: Medicare Other | Attending: Internal Medicine | Admitting: Internal Medicine

## 2021-08-04 DIAGNOSIS — R0681 Apnea, not elsewhere classified: Secondary | ICD-10-CM

## 2021-08-04 DIAGNOSIS — E662 Morbid (severe) obesity with alveolar hypoventilation: Secondary | ICD-10-CM | POA: Insufficient documentation

## 2021-08-04 DIAGNOSIS — R0683 Snoring: Secondary | ICD-10-CM | POA: Insufficient documentation

## 2021-08-04 DIAGNOSIS — Z9981 Dependence on supplemental oxygen: Secondary | ICD-10-CM | POA: Diagnosis not present

## 2021-08-04 DIAGNOSIS — Z6841 Body Mass Index (BMI) 40.0 and over, adult: Secondary | ICD-10-CM | POA: Insufficient documentation

## 2021-08-06 ENCOUNTER — Other Ambulatory Visit (HOSPITAL_COMMUNITY): Payer: Self-pay

## 2021-08-06 ENCOUNTER — Telehealth: Payer: Self-pay | Admitting: Nurse Practitioner

## 2021-08-06 DIAGNOSIS — N179 Acute kidney failure, unspecified: Secondary | ICD-10-CM | POA: Diagnosis not present

## 2021-08-06 DIAGNOSIS — G4733 Obstructive sleep apnea (adult) (pediatric): Secondary | ICD-10-CM | POA: Diagnosis not present

## 2021-08-06 DIAGNOSIS — J9601 Acute respiratory failure with hypoxia: Secondary | ICD-10-CM | POA: Diagnosis not present

## 2021-08-06 DIAGNOSIS — J9602 Acute respiratory failure with hypercapnia: Secondary | ICD-10-CM | POA: Diagnosis not present

## 2021-08-06 DIAGNOSIS — I214 Non-ST elevation (NSTEMI) myocardial infarction: Secondary | ICD-10-CM | POA: Diagnosis not present

## 2021-08-06 DIAGNOSIS — J439 Emphysema, unspecified: Secondary | ICD-10-CM | POA: Diagnosis not present

## 2021-08-06 DIAGNOSIS — G9341 Metabolic encephalopathy: Secondary | ICD-10-CM | POA: Diagnosis not present

## 2021-08-06 NOTE — Telephone Encounter (Signed)
Called patient directly to get permission to disclosure results to [facility] but he did not answer. Eustaquio Maize has never called our office for this patient before. Left message for him to call us back.

## 2021-08-09 DIAGNOSIS — F17210 Nicotine dependence, cigarettes, uncomplicated: Secondary | ICD-10-CM | POA: Diagnosis not present

## 2021-08-09 DIAGNOSIS — R918 Other nonspecific abnormal finding of lung field: Secondary | ICD-10-CM | POA: Diagnosis not present

## 2021-08-09 DIAGNOSIS — K8020 Calculus of gallbladder without cholecystitis without obstruction: Secondary | ICD-10-CM | POA: Diagnosis not present

## 2021-08-09 DIAGNOSIS — Z89021 Acquired absence of right finger(s): Secondary | ICD-10-CM | POA: Diagnosis not present

## 2021-08-09 DIAGNOSIS — I10 Essential (primary) hypertension: Secondary | ICD-10-CM | POA: Diagnosis not present

## 2021-08-09 DIAGNOSIS — K760 Fatty (change of) liver, not elsewhere classified: Secondary | ICD-10-CM | POA: Diagnosis not present

## 2021-08-09 DIAGNOSIS — E119 Type 2 diabetes mellitus without complications: Secondary | ICD-10-CM | POA: Diagnosis not present

## 2021-08-09 DIAGNOSIS — Z8701 Personal history of pneumonia (recurrent): Secondary | ICD-10-CM | POA: Diagnosis not present

## 2021-08-09 DIAGNOSIS — Q6102 Congenital multiple renal cysts: Secondary | ICD-10-CM | POA: Diagnosis not present

## 2021-08-09 DIAGNOSIS — Z6841 Body Mass Index (BMI) 40.0 and over, adult: Secondary | ICD-10-CM | POA: Diagnosis not present

## 2021-08-09 DIAGNOSIS — K219 Gastro-esophageal reflux disease without esophagitis: Secondary | ICD-10-CM | POA: Diagnosis not present

## 2021-08-09 DIAGNOSIS — N179 Acute kidney failure, unspecified: Secondary | ICD-10-CM | POA: Diagnosis not present

## 2021-08-09 DIAGNOSIS — D751 Secondary polycythemia: Secondary | ICD-10-CM | POA: Diagnosis not present

## 2021-08-09 DIAGNOSIS — J9602 Acute respiratory failure with hypercapnia: Secondary | ICD-10-CM | POA: Diagnosis not present

## 2021-08-09 DIAGNOSIS — G9341 Metabolic encephalopathy: Secondary | ICD-10-CM | POA: Diagnosis not present

## 2021-08-09 DIAGNOSIS — I214 Non-ST elevation (NSTEMI) myocardial infarction: Secondary | ICD-10-CM | POA: Diagnosis not present

## 2021-08-09 DIAGNOSIS — J9601 Acute respiratory failure with hypoxia: Secondary | ICD-10-CM | POA: Diagnosis not present

## 2021-08-09 DIAGNOSIS — I878 Other specified disorders of veins: Secondary | ICD-10-CM | POA: Diagnosis not present

## 2021-08-09 DIAGNOSIS — H4089 Other specified glaucoma: Secondary | ICD-10-CM | POA: Diagnosis not present

## 2021-08-09 DIAGNOSIS — Z86718 Personal history of other venous thrombosis and embolism: Secondary | ICD-10-CM | POA: Diagnosis not present

## 2021-08-09 DIAGNOSIS — D696 Thrombocytopenia, unspecified: Secondary | ICD-10-CM | POA: Diagnosis not present

## 2021-08-09 DIAGNOSIS — E785 Hyperlipidemia, unspecified: Secondary | ICD-10-CM | POA: Diagnosis not present

## 2021-08-09 DIAGNOSIS — E875 Hyperkalemia: Secondary | ICD-10-CM | POA: Diagnosis not present

## 2021-08-09 DIAGNOSIS — J439 Emphysema, unspecified: Secondary | ICD-10-CM | POA: Diagnosis not present

## 2021-08-13 DIAGNOSIS — J9602 Acute respiratory failure with hypercapnia: Secondary | ICD-10-CM | POA: Diagnosis not present

## 2021-08-13 DIAGNOSIS — G9341 Metabolic encephalopathy: Secondary | ICD-10-CM | POA: Diagnosis not present

## 2021-08-13 DIAGNOSIS — N179 Acute kidney failure, unspecified: Secondary | ICD-10-CM | POA: Diagnosis not present

## 2021-08-13 DIAGNOSIS — J439 Emphysema, unspecified: Secondary | ICD-10-CM | POA: Diagnosis not present

## 2021-08-13 DIAGNOSIS — I214 Non-ST elevation (NSTEMI) myocardial infarction: Secondary | ICD-10-CM | POA: Diagnosis not present

## 2021-08-13 DIAGNOSIS — J9601 Acute respiratory failure with hypoxia: Secondary | ICD-10-CM | POA: Diagnosis not present

## 2021-08-13 NOTE — Telephone Encounter (Signed)
Beth from [facility] is returning phone call. Patient gave permission to talk to [facility]. Beth phone number is [phone].

## 2021-08-13 NOTE — Telephone Encounter (Signed)
Hi Mr. Boyajian, Your chest x ray today overall looked normal and did not show any evidence of persistent pneumonia. You still have an area of thinning along your diaphragm, which is unchanged from your previous exams. Continue with our plans, as discussed today and contact us if you need anything! It was a pleasure meeting you today! Thanks! Roxan Diesel, NP  I called and spoke with the pt's nurse, [place], ok per pt and notified of results per Joellen Jersey  She verbalized understanding  Nothing further needed

## 2021-08-20 ENCOUNTER — Telehealth: Payer: Self-pay | Admitting: Nurse Practitioner

## 2021-08-20 DIAGNOSIS — J439 Emphysema, unspecified: Secondary | ICD-10-CM | POA: Diagnosis not present

## 2021-08-20 DIAGNOSIS — N179 Acute kidney failure, unspecified: Secondary | ICD-10-CM | POA: Diagnosis not present

## 2021-08-20 DIAGNOSIS — G9341 Metabolic encephalopathy: Secondary | ICD-10-CM | POA: Diagnosis not present

## 2021-08-20 DIAGNOSIS — I214 Non-ST elevation (NSTEMI) myocardial infarction: Secondary | ICD-10-CM | POA: Diagnosis not present

## 2021-08-20 DIAGNOSIS — J9601 Acute respiratory failure with hypoxia: Secondary | ICD-10-CM | POA: Diagnosis not present

## 2021-08-20 DIAGNOSIS — J9602 Acute respiratory failure with hypercapnia: Secondary | ICD-10-CM | POA: Diagnosis not present

## 2021-08-20 NOTE — Procedures (Signed)
NAME: Manuel Leonard DATE OF BIRTH:  1951-04-10 MEDICAL RECORD NUMBER [id number]  LOCATION: [facility]  PHYSICIAN: Marius Ditch  DATE OF STUDY: 08/04/2021  SLEEP STUDY TYPE: Positive Airway Pressure Titration               REFERRING PHYSICIAN: Marius Ditch, MD  EPWORTH SLEEPINESS SCORE:  5 HEIGHT: 5\' 11"  (180.3 cm)  WEIGHT: 291 lb (132 kg)    Body mass index is 40.59 kg/m.  NECK SIZE: 19 in.  CLINICAL INFORMATION The patient was referred to the sleep center for evaluation of hypoxemia, witnessed apnea, snoring, elevated HCO3 in the setting of Morbid Obesity. Patient using chronic oxygen due to recent pneumonia in the setting of COPD. An ABG was not obtained during recent hospitalization.  MEDICATIONS Patient self administered medications include: none  SLEEP STUDY TECHNIQUE A multi-channel overnight Polysomnography study was performed. The channels recorded and monitored were central and occipital EEG, electrooculogram (EOG), submentalis EMG (chin), nasal and oral airflow, thoracic and abdominal wall motion, anterior tibialis EMG, snore microphone, electrocardiogram, and a pulse oximetry. EtCO2 was also measured.   TECHNICAL COMMENTS Comments added by Technician: O2 initiated due to low sats. Patient had difficulty initiating sleep. Patient was restless all through the night. Patient had more than two awakenings to use the bathroom Comments added by Scorer: N/A  SLEEP ARCHITECTURE The study was initiated at 10:00:53 PM and terminated at 4:48:06 AM. The total recorded time was 407.2 minutes. EEG confirmed total sleep time was 152 minutes yielding a sleep efficiency of 37.3%. Sleep onset after lights out was 6.2 minutes with a REM latency of 200.0 minutes. The patient spent 29.9% of the night in stage N1 sleep, 48.7% in stage N2 sleep, 20.1% in stage N3 and 1.3% in REM. Wake after sleep onset (WASO) was 249.0 minutes. The Arousal Index was  31.2/hour.  RESPIRATORY PARAMETERS There were a total of 12 respiratory disturbances out of which 0 were apneas ( 0 obstructive, 0 mixed, 0 central) and 12 hypopneas. The apnea/hypopnea index (AHI) was 4.7 events/hour. The central sleep apnea index was 0 events/hour. The REM AHI was 30.0 events/hour and NREM AHI was 4.4 events/hour. He did not sleep supine.  Respiratory disturbance index was 19.3 events/hour. Respiratory disturbances were associated with oxygen desaturation down to a nadir of 75.0% during sleep. The mean oxygen saturation during the study was 89.5%. EtCO2 showed Max EtCO2 of 53.8 and mean EtCO2 of 49.5. EtCO2 > 50 mmHg for 2.4% of wakefulness and 16.4% of NREM sleep.   LEG MOVEMENT DATA The total leg movements were 17 with a resulting leg movement index of 6.7/hr . Associated arousal with leg movement index was 0.0/hr.  CARDIAC DATA The underlying cardiac rhythm was most consistent with sinus rhythm. Mean heart rate during sleep was 100.3 bpm. Additional rhythm abnormalities include None.  IMPRESSIONS - By AHI calculation, the patient had no significant Obstructive Sleep Apnea (OSA). However, the patient was on oxygen much of the night which reduced the likelihood of seeing 3% and 4% desaturations. His RDI is consistent with moderate Obstructive Sleep Apnea (OSA). My opinion is that he would have moderate OSA if he was not given supplemental oxygen - Severe Oxygen Desaturation - Evidence of hypoventilation  DIAGNOSIS - Obstructive sleep apnea by RDI - Obesity hypoventilation syndrome   RECOMMENDATIONS - Therapeutic CPAP or BPAP titration to determine optimal pressure required to alleviate sleep disordered breathing, hypoxemia, and hypoventilation.  - He has a cough  that has persisted since his pneumonia. PAP titration should be held until cough resolves.    Marius Ditch Sleep specialist, [place] [employer]  ELECTRONICALLY SIGNED ON:  08/20/2021, 8:25  PM [place] PH: [phone]   FX: [phone] [place]

## 2021-08-20 NOTE — Telephone Encounter (Signed)
[place] and spoke with someone to let them know that patient is on oxygen and verified fax number with him of [phone]. OV notes have been faxed. Nothing further needed at this time.

## 2021-08-22 DIAGNOSIS — J9601 Acute respiratory failure with hypoxia: Secondary | ICD-10-CM | POA: Diagnosis not present

## 2021-08-22 DIAGNOSIS — J439 Emphysema, unspecified: Secondary | ICD-10-CM | POA: Diagnosis not present

## 2021-08-22 DIAGNOSIS — N179 Acute kidney failure, unspecified: Secondary | ICD-10-CM | POA: Diagnosis not present

## 2021-08-22 DIAGNOSIS — G9341 Metabolic encephalopathy: Secondary | ICD-10-CM | POA: Diagnosis not present

## 2021-08-22 DIAGNOSIS — J9602 Acute respiratory failure with hypercapnia: Secondary | ICD-10-CM | POA: Diagnosis not present

## 2021-08-22 DIAGNOSIS — I214 Non-ST elevation (NSTEMI) myocardial infarction: Secondary | ICD-10-CM | POA: Diagnosis not present

## 2021-08-27 DIAGNOSIS — N179 Acute kidney failure, unspecified: Secondary | ICD-10-CM | POA: Diagnosis not present

## 2021-08-27 DIAGNOSIS — J9601 Acute respiratory failure with hypoxia: Secondary | ICD-10-CM | POA: Diagnosis not present

## 2021-08-27 DIAGNOSIS — J9602 Acute respiratory failure with hypercapnia: Secondary | ICD-10-CM | POA: Diagnosis not present

## 2021-08-27 DIAGNOSIS — J439 Emphysema, unspecified: Secondary | ICD-10-CM | POA: Diagnosis not present

## 2021-08-27 DIAGNOSIS — G9341 Metabolic encephalopathy: Secondary | ICD-10-CM | POA: Diagnosis not present

## 2021-08-27 DIAGNOSIS — I214 Non-ST elevation (NSTEMI) myocardial infarction: Secondary | ICD-10-CM | POA: Diagnosis not present

## 2021-09-03 DIAGNOSIS — J9602 Acute respiratory failure with hypercapnia: Secondary | ICD-10-CM | POA: Diagnosis not present

## 2021-09-03 DIAGNOSIS — I214 Non-ST elevation (NSTEMI) myocardial infarction: Secondary | ICD-10-CM | POA: Diagnosis not present

## 2021-09-03 DIAGNOSIS — J9601 Acute respiratory failure with hypoxia: Secondary | ICD-10-CM | POA: Diagnosis not present

## 2021-09-03 DIAGNOSIS — G9341 Metabolic encephalopathy: Secondary | ICD-10-CM | POA: Diagnosis not present

## 2021-09-03 DIAGNOSIS — N179 Acute kidney failure, unspecified: Secondary | ICD-10-CM | POA: Diagnosis not present

## 2021-09-03 DIAGNOSIS — J439 Emphysema, unspecified: Secondary | ICD-10-CM | POA: Diagnosis not present

## 2021-09-04 DIAGNOSIS — E1143 Type 2 diabetes mellitus with diabetic autonomic (poly)neuropathy: Secondary | ICD-10-CM | POA: Diagnosis not present

## 2021-09-04 DIAGNOSIS — G4733 Obstructive sleep apnea (adult) (pediatric): Secondary | ICD-10-CM | POA: Diagnosis not present

## 2021-09-04 DIAGNOSIS — Z7984 Long term (current) use of oral hypoglycemic drugs: Secondary | ICD-10-CM | POA: Diagnosis not present

## 2021-09-04 DIAGNOSIS — E785 Hyperlipidemia, unspecified: Secondary | ICD-10-CM | POA: Diagnosis not present

## 2021-09-04 DIAGNOSIS — Z23 Encounter for immunization: Secondary | ICD-10-CM | POA: Diagnosis not present

## 2021-09-04 DIAGNOSIS — J449 Chronic obstructive pulmonary disease, unspecified: Secondary | ICD-10-CM | POA: Diagnosis not present

## 2021-09-13 ENCOUNTER — Encounter (HOSPITAL_BASED_OUTPATIENT_CLINIC_OR_DEPARTMENT_OTHER): Payer: Self-pay

## 2021-09-13 DIAGNOSIS — G4733 Obstructive sleep apnea (adult) (pediatric): Secondary | ICD-10-CM

## 2021-10-14 ENCOUNTER — Encounter (HOSPITAL_BASED_OUTPATIENT_CLINIC_OR_DEPARTMENT_OTHER): Payer: Medicare Other | Admitting: Sleep Medicine

## 2021-10-18 ENCOUNTER — Other Ambulatory Visit: Payer: Self-pay | Admitting: Nurse Practitioner

## 2021-10-18 DIAGNOSIS — Z720 Tobacco use: Secondary | ICD-10-CM

## 2021-10-30 DIAGNOSIS — E785 Hyperlipidemia, unspecified: Secondary | ICD-10-CM | POA: Diagnosis not present

## 2021-10-30 DIAGNOSIS — R079 Chest pain, unspecified: Secondary | ICD-10-CM | POA: Diagnosis not present

## 2021-10-30 DIAGNOSIS — E119 Type 2 diabetes mellitus without complications: Secondary | ICD-10-CM | POA: Diagnosis not present

## 2021-10-31 ENCOUNTER — Other Ambulatory Visit: Payer: Self-pay | Admitting: Family Medicine

## 2021-10-31 ENCOUNTER — Other Ambulatory Visit (HOSPITAL_COMMUNITY): Payer: Self-pay | Admitting: Family Medicine

## 2021-10-31 DIAGNOSIS — E785 Hyperlipidemia, unspecified: Secondary | ICD-10-CM

## 2021-10-31 DIAGNOSIS — R0789 Other chest pain: Secondary | ICD-10-CM

## 2021-10-31 DIAGNOSIS — R079 Chest pain, unspecified: Secondary | ICD-10-CM

## 2021-10-31 DIAGNOSIS — J449 Chronic obstructive pulmonary disease, unspecified: Secondary | ICD-10-CM

## 2021-10-31 DIAGNOSIS — I10 Essential (primary) hypertension: Secondary | ICD-10-CM

## 2021-11-12 ENCOUNTER — Ambulatory Visit (HOSPITAL_BASED_OUTPATIENT_CLINIC_OR_DEPARTMENT_OTHER): Payer: Medicare Other | Attending: Internal Medicine | Admitting: Sleep Medicine

## 2021-11-12 ENCOUNTER — Other Ambulatory Visit: Payer: Self-pay

## 2021-11-12 DIAGNOSIS — R0902 Hypoxemia: Secondary | ICD-10-CM | POA: Insufficient documentation

## 2021-11-12 DIAGNOSIS — E119 Type 2 diabetes mellitus without complications: Secondary | ICD-10-CM | POA: Insufficient documentation

## 2021-11-12 DIAGNOSIS — G4733 Obstructive sleep apnea (adult) (pediatric): Secondary | ICD-10-CM | POA: Diagnosis not present

## 2021-11-12 DIAGNOSIS — J449 Chronic obstructive pulmonary disease, unspecified: Secondary | ICD-10-CM | POA: Diagnosis not present

## 2021-11-22 DIAGNOSIS — G4733 Obstructive sleep apnea (adult) (pediatric): Secondary | ICD-10-CM | POA: Diagnosis not present

## 2021-12-06 ENCOUNTER — Encounter: Payer: Self-pay | Admitting: Emergency Medicine

## 2021-12-06 ENCOUNTER — Ambulatory Visit (INDEPENDENT_AMBULATORY_CARE_PROVIDER_SITE_OTHER): Payer: Medicare Other | Admitting: Emergency Medicine

## 2021-12-06 DIAGNOSIS — R0902 Hypoxemia: Secondary | ICD-10-CM | POA: Diagnosis not present

## 2021-12-06 DIAGNOSIS — G4733 Obstructive sleep apnea (adult) (pediatric): Secondary | ICD-10-CM

## 2021-12-06 DIAGNOSIS — Z720 Tobacco use: Secondary | ICD-10-CM

## 2021-12-06 DIAGNOSIS — J449 Chronic obstructive pulmonary disease, unspecified: Secondary | ICD-10-CM | POA: Diagnosis not present

## 2021-12-06 DIAGNOSIS — R918 Other nonspecific abnormal finding of lung field: Secondary | ICD-10-CM

## 2021-12-06 MED ORDER — ALBUTEROL SULFATE HFA 108 (90 BASE) MCG/ACT IN AERS: 2.0000 | INHALATION_SPRAY | Freq: Four times a day (QID) | RESPIRATORY_TRACT | 6 refills | Status: AC | PRN

## 2021-12-06 NOTE — Assessment & Plan Note (Signed)
We will perform a walking oximetry today to see if you still need to wear oxygen when you are exerting yourself. ? ? ?

## 2021-12-06 NOTE — Assessment & Plan Note (Signed)
We will order CPAP 13 cmH2O, best fit nasal mask, for you to use every night while sleeping. ?You will need to follow-up in our office about 1 month after starting your CPAP so we can discuss your usage ?

## 2021-12-06 NOTE — Addendum Note (Signed)
Addended by: Monna Fam L on: 12/06/2021 01:00 PM ? ? Modules accepted: Orders ? ?

## 2021-12-06 NOTE — Assessment & Plan Note (Signed)
Stop Incruse temporarily.  We will try starting Stiolto 2 puffs once daily to see if you get more benefit.  Please call to let us know if you prefer the Stiolto so we can order it for you through your pharmacy. ?Keep albuterol available to use 2 puffs if needed for shortness of breath, chest tightness, wheezing.  We will refill this for you today. ?Follow with Dr Lamonte Sakai in 6 months or sooner if you have any problems ?

## 2021-12-06 NOTE — Progress Notes (Signed)
? ?Subjective:  ? ? Patient ID: Manuel Leonard, male    DOB: 04-28-1951, 71 y.o.   MRN: [id number] ? ?HPI ?71 yo former smoker (48 pk-yrs), hx HTN, GERD, COPD, renal cysts and hematuria. Was discovered to have a 2-50m LLL nodule, probably calcified.  ? ?ROV 12/06/21 --[age]year old gentleman, smoker (48 pack years) whom I have seen in the past for COPD and calcified pulmonary nodular disease.  He is reestablishing care after hospitalization in November 2022.  Past medical history also significant for atrial fibrillation, hypertension with diastolic dysfunction, prior DVT, hyperlipidemia, diabetes, GERD. ?He was hospitalized with influenza A and CAP, associated hypoxemic respiratory failure requiring intubation and mechanical ventilation.  There is suspicion for obstructive sleep apnea and a polysomnogram has been performed. ?Currently managed on Incruse.  He has supplemental oxygen 2 L/min.  He is using albuterol very rarely rarely ? ? ?PSG 08/04/2021 reviewed, showed low AHI but this was done on supplemental oxygen.  His RDI was 19.3/h ?CPAP titration polysomnogram 11/12/2021 reviewed by me titrated him to CPAP 13 cmH2O ? ?CT chest 06/30/2021 reviewed by me.  The contrast bolus was inadequate to assess for pulmonary embolism.  There were no mediastinal nodes or hilar nodes.  No infiltrates or masses.  No change tiny calcified pulmonary nodules. ? ? ? ?Review of Systems  ?Constitutional:  Negative for fever and unexpected weight change.  ?HENT:  Negative for congestion, dental problem, ear pain, nosebleeds, postnasal drip, rhinorrhea, sinus pressure, sneezing, sore throat and trouble swallowing.   ?Eyes:  Negative for redness and itching.  ?Respiratory:  Negative for cough, chest tightness, shortness of breath and wheezing.   ?Cardiovascular:  Negative for chest pain, palpitations and leg swelling.  ?Gastrointestinal:  Negative for nausea and vomiting.  ?Genitourinary:  Negative for dysuria.  ?Musculoskeletal:   Negative for joint swelling.  ?Skin:  Negative for rash.  ?Neurological:  Negative for headaches.  ?Hematological:  Does not bruise/bleed easily.  ?Psychiatric/Behavioral:  Negative for dysphoric mood. The patient is not nervous/anxious.   ?Past Medical History:  ?Diagnosis Date  ? Abnormal EKG 12/03/2017  ? Acute bronchitis 12/29/2016  ? Adenomatous polyps   ? Chest discomfort 12/03/2017  ? [place]   ? [place] 12/03/[place]  ? Emphysema/COPD ([facility]   ? GERD (gastroesophageal reflux disease)   ? Hyperlipidemia 12/21/2015  ? Hypertension   ? not on meds   ? Leukocytosis 09/27/2014  ? Multiple pulmonary nodules   ? Pneumonia   ? hx of 2017   ? [place]is [place] 09/27/2014  ? Tobacco abuse   ? Umbilical hernia 23/24/4010 ?  ? ?Family History  ?Problem Relation Age of Onset  ? Lung cancer Brother   ? Stomach cancer Brother   ? Cancer Father   ?  ? ?Social History  ? ?Socioeconomic History  ? Marital status: Married  ?  Spouse name: Not on file  ? Number of children: Not on file  ? Years of education: Not on file  ? Highest education level: Not on file  ?Occupational History  ? 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 Smoking status: Former  ?  Packs/day: 2.00  ?  Years: 48.00  ?  Pack years: 96.00  ?  Types: Cigarettes  ?  Quit date: 06/30/2021  ?  Years since quitting: 0.4  ? Smokeless tobacco: Never  ?Vaping Use  ? Vaping Use: Never used  ?Substance and Sexual Activity  ? Alcohol use: Yes  ?  Alcohol/week: 0.0 standard drinks  ?  Comment: occasional glass of wine or beer   ? Drug use: No  ? Sexual activity: Not on file  ?Other Topics Concern  ? Not on file  ?Social History Narrative  ? Not on file  ? ?Social Determinants of Health  ? ?Financial Resource Strain: Not on file  ?Food Insecurity: Not on file  ?Transportation Needs: Not on file  ?Physical Activity: Not on file  ?Stress: Not on file  ?Social Connections: Not on file  ?Intimate Partner Violence: Not on file  ?Never  exposed to TB to his knowledge ?Originally from [place], has lived here for 42 yrs.  ?He works in a [employer]  ? ?No Known Allergies  ? ?Outpatient Medications Prior to Visit  ?Medication Sig Dispense Refill  ? albuterol (VENTOLIN HFA) 108 (90 Base) MCG/ACT inhaler Inhale 2 puffs into the lungs every 6 (six) hours as needed for wheezing or shortness of breath. 8 g 2  ? aspirin 81 MG EC tablet Take 1 tablet (81 mg total) by mouth daily. 30 tablet 3  ? buPROPion (WELLBUTRIN SR) 150 MG 12 hr tablet TAKE 1 TABLET(150 MG) BY MOUTH TWICE DAILY 60 tablet 1  ? ibuprofen (ADVIL) 200 MG tablet Take 600 mg by mouth every 6 (six) hours as needed for mild pain.    ? ipratropium-albuterol (DUONEB) 0.5-2.5 (3) MG/3ML SOLN Take 3 mLs by nebulization every 6 (six) hours as needed (for shortness of breath or wheezing). 75 mL 3  ? metFORMIN (GLUCOPHAGE) 500 MG tablet Take 2 (two) times daily with a meal by mouth.    ? omeprazole (PRILOSEC) 20 MG capsule Take 1 capsule (20 mg total) by mouth 2 (two) times daily before a meal. 30 capsule 0  ? ondansetron (ZOFRAN) 4 MG tablet Take 1 tablet (4 mg total) by mouth every 8 (eight) hours as needed for nausea or vomiting. 20 tablet 0  ? rosuvastatin (CRESTOR) 20 MG tablet Take 1 tablet (20 mg total) by mouth daily. 30 tablet 11  ? TRULICITY 3.81 WE/9.9BZ SOPN Inject 0.75 mg into the skin once a week. Wednesday    ? umeclidinium bromide (INCRUSE ELLIPTA) 62.5 MCG/ACT AEPB Inhale 1 puff into the lungs daily. 30 each 2  ? ?No facility-administered medications prior to visit.  ? ? ? ? ?   ?Objective:  ? Physical Exam ?Vitals:  ? 12/06/21 1146  ?BP: 130/68  ?Pulse: 99  ?Temp: (!) 97.5 ?F (36.4 ?C)  ?TempSrc: Oral  ?SpO2: 98%  ?Weight: (!) 310 lb (140.6 kg)  ?Height: 6' (1.829 m)  ? ?Gen: Pleasant, obese, in no distress,  normal affect ? ?ENT: No lesions,  mouth clear,  oropharynx clear, no postnasal drip ? ?Neck: No JVD, no stridor ? ?Lungs: No use of accessory muscles, no  wheezes ? ?Cardiovascular: RRR, heart sounds normal, no murmur or gallops, no peripheral edema ? ?Musculoskeletal: No deformities, no cyanosis or clubbing ? ?Neuro: alert, non focal ? ?Skin: Warm, no lesions or rashes ? ? ? ?   ?Assessment & Plan:  ?COPD (chronic obstructive pulmonary disease) ?Stop Incruse temporarily.  We will try starting Stiolto 2 puffs once daily to see if you get more benefit.  Please call to let us know if you prefer the Stiolto so we can order it for you through your pharmacy. ?Keep albuterol available to use 2 puffs if needed for shortness of breath, chest tightness, wheezing.  We will refill this for you today. ?Follow with  Dr Lamonte Sakai in 6 months or sooner if you have any problems ? ?OSA (obstructive sleep apnea) ?We will order CPAP 13 cmH2O, best fit nasal mask, for you to use every night while sleeping. ?You will need to follow-up in our office about 1 month after starting your CPAP so we can discuss your usage ? ?Hypoxia ?We will perform a walking oximetry today to see if you still need to wear oxygen when you are exerting yourself. ? ? ? ?Tobacco abuse ?Congratulations on stopping smoking!. ? ?Multiple pulmonary nodules ?We reviewed your CT scan of the chest from 06/30/2021.  No concerning pulmonary nodules present. ? ?Baltazar Apo, MD, PhD ?12/06/2021, 12:27 PM ?[facility] ?[phone] or if no answer [phone] ? ?

## 2021-12-06 NOTE — Assessment & Plan Note (Signed)
We reviewed your CT scan of the chest from 06/30/2021.  No concerning pulmonary nodules present. ?

## 2021-12-06 NOTE — Patient Instructions (Signed)
We will order CPAP 13 cmH2O, best fit nasal mask, for you to use every night while sleeping. ?You will need to follow-up in our office about 1 month after starting your CPAP so we can discuss your usage ?We will perform a walking oximetry today to see if you still need to wear oxygen when you are exerting yourself. ?Stop Incruse temporarily.  We will try starting Stiolto 2 puffs once daily to see if you get more benefit.  Please call to let us know if you prefer the Stiolto so we can order it for you through your pharmacy. ?Keep albuterol available to use 2 puffs if needed for shortness of breath, chest tightness, wheezing.  We will refill this for you today. ?We reviewed your CT scan of the chest from 06/30/2021.  No concerning pulmonary nodules present. ?Congratulations on stopping smoking!Marland Kitchen ?Follow with Dr Lamonte Sakai in 6 months or sooner if you have any problems ? ?

## 2021-12-06 NOTE — Assessment & Plan Note (Signed)
Congratulations on stopping smoking!  

## 2021-12-10 ENCOUNTER — Encounter: Payer: Self-pay | Admitting: Emergency Medicine

## 2021-12-10 ENCOUNTER — Telehealth: Payer: Self-pay | Admitting: Emergency Medicine

## 2021-12-10 DIAGNOSIS — E119 Type 2 diabetes mellitus without complications: Secondary | ICD-10-CM | POA: Diagnosis not present

## 2021-12-10 DIAGNOSIS — E785 Hyperlipidemia, unspecified: Secondary | ICD-10-CM | POA: Diagnosis not present

## 2021-12-10 DIAGNOSIS — J449 Chronic obstructive pulmonary disease, unspecified: Secondary | ICD-10-CM

## 2021-12-10 NOTE — Telephone Encounter (Signed)
Yes - I thought we had placed an order to stop it at his OV. He did not desaturate. He is going to be on CPAP at night, no o2 ?Ok to write order / letter to d/c O2 ?

## 2021-12-10 NOTE — Telephone Encounter (Signed)
Called and spoke with patient who is calling to get a discharge letter sent to [facility] stating that he no longer needs oxygen so they can pick it up. ? ?Dr. Lamonte Sakai please advise if you are ok with [place] placing DME order to D/C oxygen ?

## 2021-12-10 NOTE — Telephone Encounter (Signed)
Called and spoke with patient who states that his oxygen is from [facility]. Order has been placed to D/C oxygen per Dr. Lamonte Sakai. Nothing further needed at this time. ?

## 2021-12-12 ENCOUNTER — Telehealth: Payer: Self-pay | Admitting: Emergency Medicine

## 2021-12-13 NOTE — Telephone Encounter (Signed)
12/11/2021 4:55 PM Danielle sent me a confirmation message saying she received the referral.  ?

## 2021-12-22 NOTE — Progress Notes (Signed)
?Cardiology Office Note:   ? ?Date:  12/23/2021  ? ?ID:  [name], DOB 09/30/1950, MRN [id number] ? ?PCP:  Lyman Bishop, DO  ?Cardiologist:  Shirlee More, MD   ? ?Referring MD: Lyman Bishop, DO  ? ? ?ASSESSMENT:   ? ?1. Chest pain of uncertain etiology   ?2. Demand ischemia ([place])   ?3. Primary hypertension   ?4. Mixed hyperlipidemia   ? ?PLAN:   ? ?In order of problems listed above: ? ?He is having chest pain I would describe it as possible angina however since being a higher risk group with his demand ischemia during recent hospitalization.  I think he should undergo further evaluation I think we will go ahead and do cardiac CTA. ?Stable currently not on antihypertensive agents ?Continue his statin ? ? ?Next appointment: 3 months ? ? ?Medication Adjustments/Labs and Tests Ordered: ?Current medicines are reviewed at length with the patient today.  Concerns regarding medicines are outlined above.  ?No orders of the defined types were placed in this encounter. ? ?No orders of the defined types were placed in this encounter. ? ? ?Chief complaint at times I have chest pain ? ? ?History of Present Illness:   ? ?Fahim Kats is a 71 y.o. male with a hx of chest pain,  T2 DM  OSA and COPD  last seen 01/22/2022. ? ?Cardiac CTA: 01/13/2018 ?IMPRESSION: ?1. Coronary artery calcium score 4 Agatston units. This places the patient in the 23rd percentile for age and gender, suggesting low risk for future cardiac events. ?2.  No obstructive coronary disease. ? ?Compliance with diet, lifestyle and medications: Yes ? ?He is a bit unsure of himself at times he will get momentary pressure in the chest not exertional relieved spontaneously the episodes are infrequent.  He relates this to occurring after his hospitalization November 2022 with respiratory failure exacerbation of COPD.  During that admission to the hospital his troponin was elevated his echocardiogram showed a low normal ejection  fraction. ? ?He has mild exertional shortness of breath no edema orthopnea palpitation or syncope ?Past Medical History:  ?Diagnosis Date  ? Abnormal EKG 12/03/2017  ? Acute bronchitis 12/29/2016  ? Adenomatous polyps   ? Chest discomfort 12/03/2017  ? COPD (chronic obstructive pulmonary disease) ([place])   ? Diabetes mellitus ([place]) 12/03/2017  ? Emphysema/COPD [facility])   ? GERD (gastroesophageal reflux disease)   ? Hyperlipidemia 12/21/2015  ? Hypertension   ? not on meds   ? Leukocytosis 09/27/2014  ? Multiple pulmonary nodules   ? Pneumonia   ? hx of 2017   ? Sepsis ([place]) 09/27/2014  ? Tobacco abuse   ? Umbilical hernia 1/69/6789  ? ? ?Past Surgical History:  ?Procedure Laterality Date  ? APPENDECTOMY    ? HEMORRHOIDECTOMY WITH HEMORRHOID BANDING    ? HERNIA REPAIR    ? bilateral   ? INSERTION OF MESH N/A 10/21/2016  ? Procedure: INSERTION OF MESH PATCH;  Surgeon: Armandina Gemma, MD;  Location: [facility];  Service: General;  Laterality: N/A;  ? right index finger surgery - partial amputation     ? TONSILLECTOMY    ? UMBILICAL HERNIA REPAIR N/A 10/21/2016  ? Procedure: UMBILICAL HERNIA REPAIR;  Surgeon: Armandina Gemma, MD;  Location: [facility];  Service: General;  Laterality: N/A;  ? ? ?Current Medications: ?Current Meds  ?Medication Sig  ? aspirin 81 MG EC tablet Take 1 tablet (81 mg total) by mouth daily.  ? ibuprofen (ADVIL) 200 MG tablet Take  600 mg by mouth every 6 (six) hours as needed for mild pain.  ? metFORMIN (GLUCOPHAGE) 500 MG tablet Take 2 (two) times daily with a meal by mouth.  ? rosuvastatin (CRESTOR) 20 MG tablet Take 1 tablet (20 mg total) by mouth daily.  ? TRULICITY 9.44 HQ/7.5FF SOPN Inject 0.75 mg into the skin once a week. Wednesday  ? umeclidinium bromide (INCRUSE ELLIPTA) 62.5 MCG/ACT AEPB Inhale 1 puff into the lungs daily.  ?  ? ?Allergies:   Patient has no known allergies.  ? ?Social History  ? ?Socioeconomic History  ? Marital status: Married  ?  Spouse name: Not on file  ? Number of children: Not on file  ? Years  of education: Not on file  ? Highest education level: Not on file  ?Occupational History  ? Occupation: Regulatory affairs officer  ?Tobacco Use  ? Smoking status: Former  ?  Packs/day: 2.00  ?  Years: 48.00  ?  Pack years: 96.00  ?  Types: Cigarettes  ?  Quit date: 06/30/2021  ?  Years since quitting: 0.4  ?  Passive exposure: Past  ? Smokeless tobacco: Never  ?Vaping Use  ? Vaping Use: Never used  ?Substance and Sexual Activity  ? Alcohol use: Yes  ?  Alcohol/week: 0.0 standard drinks  ?  Comment: occasional glass of wine or beer   ? Drug use: No  ? Sexual activity: Not on file  ?Other Topics Concern  ? Not on file  ?Social History Narrative  ? Not on file  ? ?Social Determinants of Health  ? ?Financial Resource Strain: Not on file  ?Food Insecurity: Not on file  ?Transportation Needs: Not on file  ?Physical Activity: Not on file  ?Stress: Not on file  ?Social Connections: Not on file  ?  ? ?Family History: ?The patient's family history includes Cancer in his father; Lung cancer in his brother; Stomach cancer in his brother. ?ROS:   ?Please see the history of present illness.    ?All other systems reviewed and are negative. ? ?EKGs/Labs/Other Studies Reviewed:   ? ?The following studies were reviewed today: ? ?EKG:  EKG ordered today and personally reviewed.  The ekg ordered today demonstrates sinus rhythm poor R wave progression left axis deviation ? ?Recent Labs: ?06/30/2021: ALT 50; B Natriuretic Peptide 212.9; TSH 0.934 ?07/02/2021: Magnesium 2.6 ?07/06/2021: BUN 39; Creatinine, Ser 0.97; Potassium 4.3; Sodium 142 ?07/08/2021: Hemoglobin 15.9; Platelets 124  ?Recent Lipid Panel ?   ?Component Value Date/Time  ? CHOL 196 06/30/2021 1750  ? TRIG 167 (H) 07/02/2021 0334  ? HDL 39 (L) 06/30/2021 1750  ? CHOLHDL 5.0 06/30/2021 1750  ? VLDL 20 06/30/2021 1750  ? LDLCALC 137 (H) 06/30/2021 1750  ? ? ?Physical Exam:   ? ?VS:  BP (!) 146/84 (BP Location: Left Arm, Patient Position: Sitting)   Pulse 94   Ht 6' (1.829 m)   Wt (!)  316 lb (143.3 kg)   SpO2 98%   BMI 42.86 kg/m?    ? ?Wt Readings from Last 3 Encounters:  ?12/23/21 (!) 316 lb (143.3 kg)  ?12/06/21 (!) 310 lb (140.6 kg)  ?11/12/21 291 lb (132 kg)  ?  ? ?GEN: Obese well nourished, well developed in no acute distress ?HEENT: Normal ?NECK: No JVD; No carotid bruits ?LYMPHATICS: No lymphadenopathy ?CARDIAC: RRR, no murmurs, rubs, gallops ?RESPIRATORY:  Clear to auscultation without rales, wheezing or rhonchi  ?ABDOMEN: Soft, non-tender, non-distended ?MUSCULOSKELETAL:  No edema; No deformity  ?SKIN:  Warm and dry ?NEUROLOGIC:  Alert and oriented x 3 ?PSYCHIATRIC:  Normal affect  ? ? ?Signed, ?Shirlee More, MD  ?12/23/2021 2:08 PM    ?[place]  ?

## 2021-12-23 ENCOUNTER — Encounter: Payer: Self-pay | Admitting: Cardiology

## 2021-12-23 ENCOUNTER — Ambulatory Visit (INDEPENDENT_AMBULATORY_CARE_PROVIDER_SITE_OTHER): Payer: Medicare Other | Admitting: Cardiology

## 2021-12-23 VITALS — BP 146/84 | HR 94 | Ht 72.0 in | Wt 316.0 lb

## 2021-12-23 DIAGNOSIS — I10 Essential (primary) hypertension: Secondary | ICD-10-CM

## 2021-12-23 DIAGNOSIS — I2489 Other forms of acute ischemic heart disease: Secondary | ICD-10-CM

## 2021-12-23 DIAGNOSIS — R072 Precordial pain: Secondary | ICD-10-CM | POA: Diagnosis not present

## 2021-12-23 DIAGNOSIS — R079 Chest pain, unspecified: Secondary | ICD-10-CM | POA: Diagnosis not present

## 2021-12-23 DIAGNOSIS — E782 Mixed hyperlipidemia: Secondary | ICD-10-CM | POA: Diagnosis not present

## 2021-12-23 DIAGNOSIS — I248 Other forms of acute ischemic heart disease: Secondary | ICD-10-CM

## 2021-12-23 DIAGNOSIS — R0789 Other chest pain: Secondary | ICD-10-CM | POA: Diagnosis not present

## 2021-12-23 MED ORDER — METOPROLOL TARTRATE 100 MG PO TABS: 100.0000 mg | ORAL_TABLET | Freq: Once | ORAL | 0 refills | Status: DC

## 2021-12-23 NOTE — Patient Instructions (Signed)
Medication Instructions:  ?Your physician recommends that you continue on your current medications as directed. Please refer to the Current Medication list given to you today. ? ?*If you need a refill on your cardiac medications before your next appointment, please call your pharmacy* ? ? ?Lab Work: ? ?Labs 1 week before CT in [address]: BMP ? ?If you have labs (blood work) drawn today and your tests are completely normal, you will receive your results only by: ?MyChart Message (if you have MyChart) OR ?A paper copy in the mail ?If you have any lab test that is abnormal or we need to change your treatment, we will call you to review the results. ? ? ?Testing/Procedures: ? ? ?Your cardiac CT will be scheduled at one of the below locations:  ? ?[facility] ?[address] ?[place], [place] [address] ?[phone] ? ?OR ? ?[place] ?[employer] ?[address] ?[place], [place] [address] ?[phone] ? ?If scheduled at [facility], please arrive at the [facility] and Children's Entrance (Entrance C2) of [facility] 30 minutes prior to test start time. ?You can use the FREE valet parking offered at entrance C (encouraged to control the heart rate for the test)  ?Proceed to the [facility] Radiology Department (first floor) to check-in and test prep. ? ?All radiology patients and guests should use entrance C2 at [facility], accessed from [facility], even though the hospital's physical address listed is [address]. ? ? ? ?If scheduled at [facility], please arrive 15 mins early for check-in and test prep. ? ?Please follow these instructions carefully (unless otherwise directed): ? ?On the Night Before the Test: ?Be sure to Drink plenty of water. ?Do not consume any caffeinated/decaffeinated beverages or chocolate 12 hours prior to your test. ?Do not take any antihistamines 12 hours prior to your  test. ? ?On the Day of the Test: ?Drink plenty of water until 1 hour prior to the test. ?Do not eat any food 4 hours prior to the test. ?You may take your regular medications prior to the test.  ?Take metoprolol (Lopressor) two hours prior to test.  ? ?     ?After the Test: ?Drink plenty of water. ?After receiving IV contrast, you may experience a mild flushed feeling. This is normal. ?On occasion, you may experience a mild rash up to 24 hours after the test. This is not dangerous. If this occurs, you can take Benadryl 25 mg and increase your fluid intake. ?If you experience trouble breathing, this can be serious. If it is severe call 911 IMMEDIATELY. If it is mild, please call our office. ?If you take any of these medications: Glipizide/Metformin, Avandament, Glucavance, please do not take 48 hours after completing test unless otherwise instructed. ? ?We will call to schedule your test 2-4 weeks out understanding that some insurance companies will need an authorization prior to the service being performed.  ? ?For non-scheduling related questions, please contact the cardiac imaging nurse navigator should you have any questions/concerns: ?Marchia Bond, Cardiac Imaging Nurse Navigator ?Gordy Clement, Cardiac Imaging Nurse Navigator ?[facility] ?Direct Office Dial: [phone]  ? ?For scheduling needs, including cancellations and rescheduling, please call [place], [phone]. ? ? ? ?Follow-Up: ?At [facility], you and your health needs are our priority.  As part of our continuing mission to provide you with exceptional heart care, we have created designated Provider Care Teams.  These Care Teams include your primary Cardiologist (physician) and Advanced Practice Providers (APPs -  Physician Assistants and Nurse Practitioners) who all work together to provide you with the care you need, when you need it. ? ?We recommend signing up for the patient portal called "MyChart".  Sign up  information is provided on this After Visit Summary.  MyChart is used to connect with patients for Virtual Visits (Telemedicine).  Patients are able to view lab/test results, encounter notes, upcoming appointments, etc.  Non-urgent messages can be sent to your provider as well.   ?To learn more about what you can do with MyChart, go to [email or web address].   ? ?Your next appointment:   ?3 month(s) ? ?The format for your next appointment:   ?In Person ? ?Provider:   ?Shirlee More, MD  ? ? ?Other Instructions ?Dermasil moisturizer to the toe and knees daily ? ?Important Information About Sugar ? ? ? ? ? ? ?

## 2022-01-06 ENCOUNTER — Telehealth: Payer: Self-pay

## 2022-01-06 NOTE — Telephone Encounter (Signed)
Received the message below from [facility]: ? ?"Can you have this patient come in at [facility] by Wednesday of this week.  He has his CT scan on Thursday, May 18?" ? ?Called patient and informed him of the need to have his blood drawn to assess his kidney function (BMP) by Tuesday for his CT. Patient stated he understood and would go to [address] tomorrow to have his blood drawn. Patient had no further questions at this time.   ?

## 2022-01-07 ENCOUNTER — Other Ambulatory Visit: Payer: Self-pay

## 2022-01-07 DIAGNOSIS — I10 Essential (primary) hypertension: Secondary | ICD-10-CM

## 2022-01-07 DIAGNOSIS — R079 Chest pain, unspecified: Secondary | ICD-10-CM | POA: Diagnosis not present

## 2022-01-07 DIAGNOSIS — R072 Precordial pain: Secondary | ICD-10-CM

## 2022-01-07 DIAGNOSIS — R0789 Other chest pain: Secondary | ICD-10-CM

## 2022-01-07 DIAGNOSIS — I248 Other forms of acute ischemic heart disease: Secondary | ICD-10-CM

## 2022-01-07 DIAGNOSIS — E782 Mixed hyperlipidemia: Secondary | ICD-10-CM | POA: Diagnosis not present

## 2022-01-08 ENCOUNTER — Telehealth (HOSPITAL_COMMUNITY): Payer: Self-pay | Admitting: *Deleted

## 2022-01-08 ENCOUNTER — Telehealth: Payer: Self-pay

## 2022-01-08 LAB — BASIC METABOLIC PANEL
BUN/Creatinine Ratio: 15 (ref 10–24)
BUN: 17 mg/dL (ref 8–27)
CO2: 24 mmol/L (ref 20–29)
Calcium: 9.6 mg/dL (ref 8.6–10.2)
Chloride: 102 mmol/L (ref 96–106)
Creatinine, Ser: 1.13 mg/dL (ref 0.76–1.27)
Glucose: 97 mg/dL (ref 70–99)
Potassium: 4.9 mmol/L (ref 3.5–5.2)
Sodium: 142 mmol/L (ref 134–144)
eGFR: 70 mL/min/{1.73_m2} (ref >59)

## 2022-01-08 NOTE — Telephone Encounter (Signed)
-----   Message from Brian J Munley, MD sent at 01/08/2022  8:17 AM EDT ----- ?Normal or stable result ?

## 2022-01-08 NOTE — Telephone Encounter (Signed)
Reaching out to patient to offer assistance regarding upcoming cardiac imaging study; pt verbalizes understanding of appt date/time, parking situation and where to check in, pre-test NPO status and medications ordered, and verified current allergies; name and call back number provided for further questions should they arise ? ?Gordy Clement RN Navigator Cardiac Imaging ?[facility] ?[phone] office ?[phone] cell ? ?Patient aware to take '100mg'$  metoprolol tartrate two hours prior to his cardiac CT scan.  He is aware to arrive at 8am. ?

## 2022-01-08 NOTE — Telephone Encounter (Signed)
Patient notified of results.

## 2022-01-09 ENCOUNTER — Ambulatory Visit (HOSPITAL_COMMUNITY)
Admission: RE | Admit: 2022-01-09 | Discharge: 2022-01-09 | Disposition: A | Discharge status: Home / Self Care | Payer: Medicare Other | Source: Ambulatory Visit | Attending: Cardiology | Admitting: Cardiology

## 2022-01-09 DIAGNOSIS — I10 Essential (primary) hypertension: Secondary | ICD-10-CM | POA: Diagnosis not present

## 2022-01-09 DIAGNOSIS — R079 Chest pain, unspecified: Secondary | ICD-10-CM | POA: Diagnosis not present

## 2022-01-09 DIAGNOSIS — E782 Mixed hyperlipidemia: Secondary | ICD-10-CM | POA: Insufficient documentation

## 2022-01-09 DIAGNOSIS — I248 Other forms of acute ischemic heart disease: Secondary | ICD-10-CM | POA: Insufficient documentation

## 2022-01-09 DIAGNOSIS — R0789 Other chest pain: Secondary | ICD-10-CM | POA: Insufficient documentation

## 2022-01-09 DIAGNOSIS — R072 Precordial pain: Secondary | ICD-10-CM | POA: Insufficient documentation

## 2022-01-09 MED ORDER — METOPROLOL TARTRATE 5 MG/5ML IV SOLN: 5.0000 mg | INTRAVENOUS | Status: DC | PRN

## 2022-01-09 MED ORDER — DILTIAZEM HCL 25 MG/5ML IV SOLN
5.0000 mg | Freq: Once | INTRAVENOUS | Status: AC
  Administered 2022-01-09: 5 mg via INTRAVENOUS

## 2022-01-09 MED ORDER — NITROGLYCERIN 0.4 MG SL SUBL
0.8000 mg | SUBLINGUAL_TABLET | Freq: Once | SUBLINGUAL | Status: AC
  Administered 2022-01-09: 0.8 mg via SUBLINGUAL

## 2022-01-09 MED ORDER — IOHEXOL 350 MG/ML SOLN
150.0000 mL | Freq: Once | INTRAVENOUS | Status: AC | PRN
  Administered 2022-01-09: 150 mL via INTRAVENOUS

## 2022-01-09 MED ORDER — DILTIAZEM HCL 25 MG/5ML IV SOLN
INTRAVENOUS | Status: AC
  Filled 2022-01-09: qty 5

## 2022-01-09 MED ORDER — NITROGLYCERIN 0.4 MG SL SUBL
SUBLINGUAL_TABLET | SUBLINGUAL | Status: AC
  Filled 2022-01-09: qty 2

## 2022-01-15 ENCOUNTER — Encounter: Payer: Self-pay | Admitting: Emergency Medicine

## 2022-01-15 ENCOUNTER — Ambulatory Visit (INDEPENDENT_AMBULATORY_CARE_PROVIDER_SITE_OTHER): Payer: Medicare Other | Admitting: Emergency Medicine

## 2022-01-15 DIAGNOSIS — R918 Other nonspecific abnormal finding of lung field: Secondary | ICD-10-CM | POA: Diagnosis not present

## 2022-01-15 DIAGNOSIS — G4733 Obstructive sleep apnea (adult) (pediatric): Secondary | ICD-10-CM | POA: Diagnosis not present

## 2022-01-15 DIAGNOSIS — I248 Other forms of acute ischemic heart disease: Secondary | ICD-10-CM | POA: Diagnosis not present

## 2022-01-15 DIAGNOSIS — S6991XA Unspecified injury of right wrist, hand and finger(s), initial encounter: Secondary | ICD-10-CM | POA: Diagnosis not present

## 2022-01-15 DIAGNOSIS — J449 Chronic obstructive pulmonary disease, unspecified: Secondary | ICD-10-CM

## 2022-01-15 DIAGNOSIS — S6990XA Unspecified injury of unspecified wrist, hand and finger(s), initial encounter: Secondary | ICD-10-CM | POA: Insufficient documentation

## 2022-01-15 IMAGING — DX DG CHEST 2V
2 series · 2 of 2 positions shown · non-contrast
Comparison: 07/03/2021

CLINICAL DATA: Community acquired pneumonia.  Follow-up exam.

EXAM:
CHEST - 2 VIEW

[chest pa]
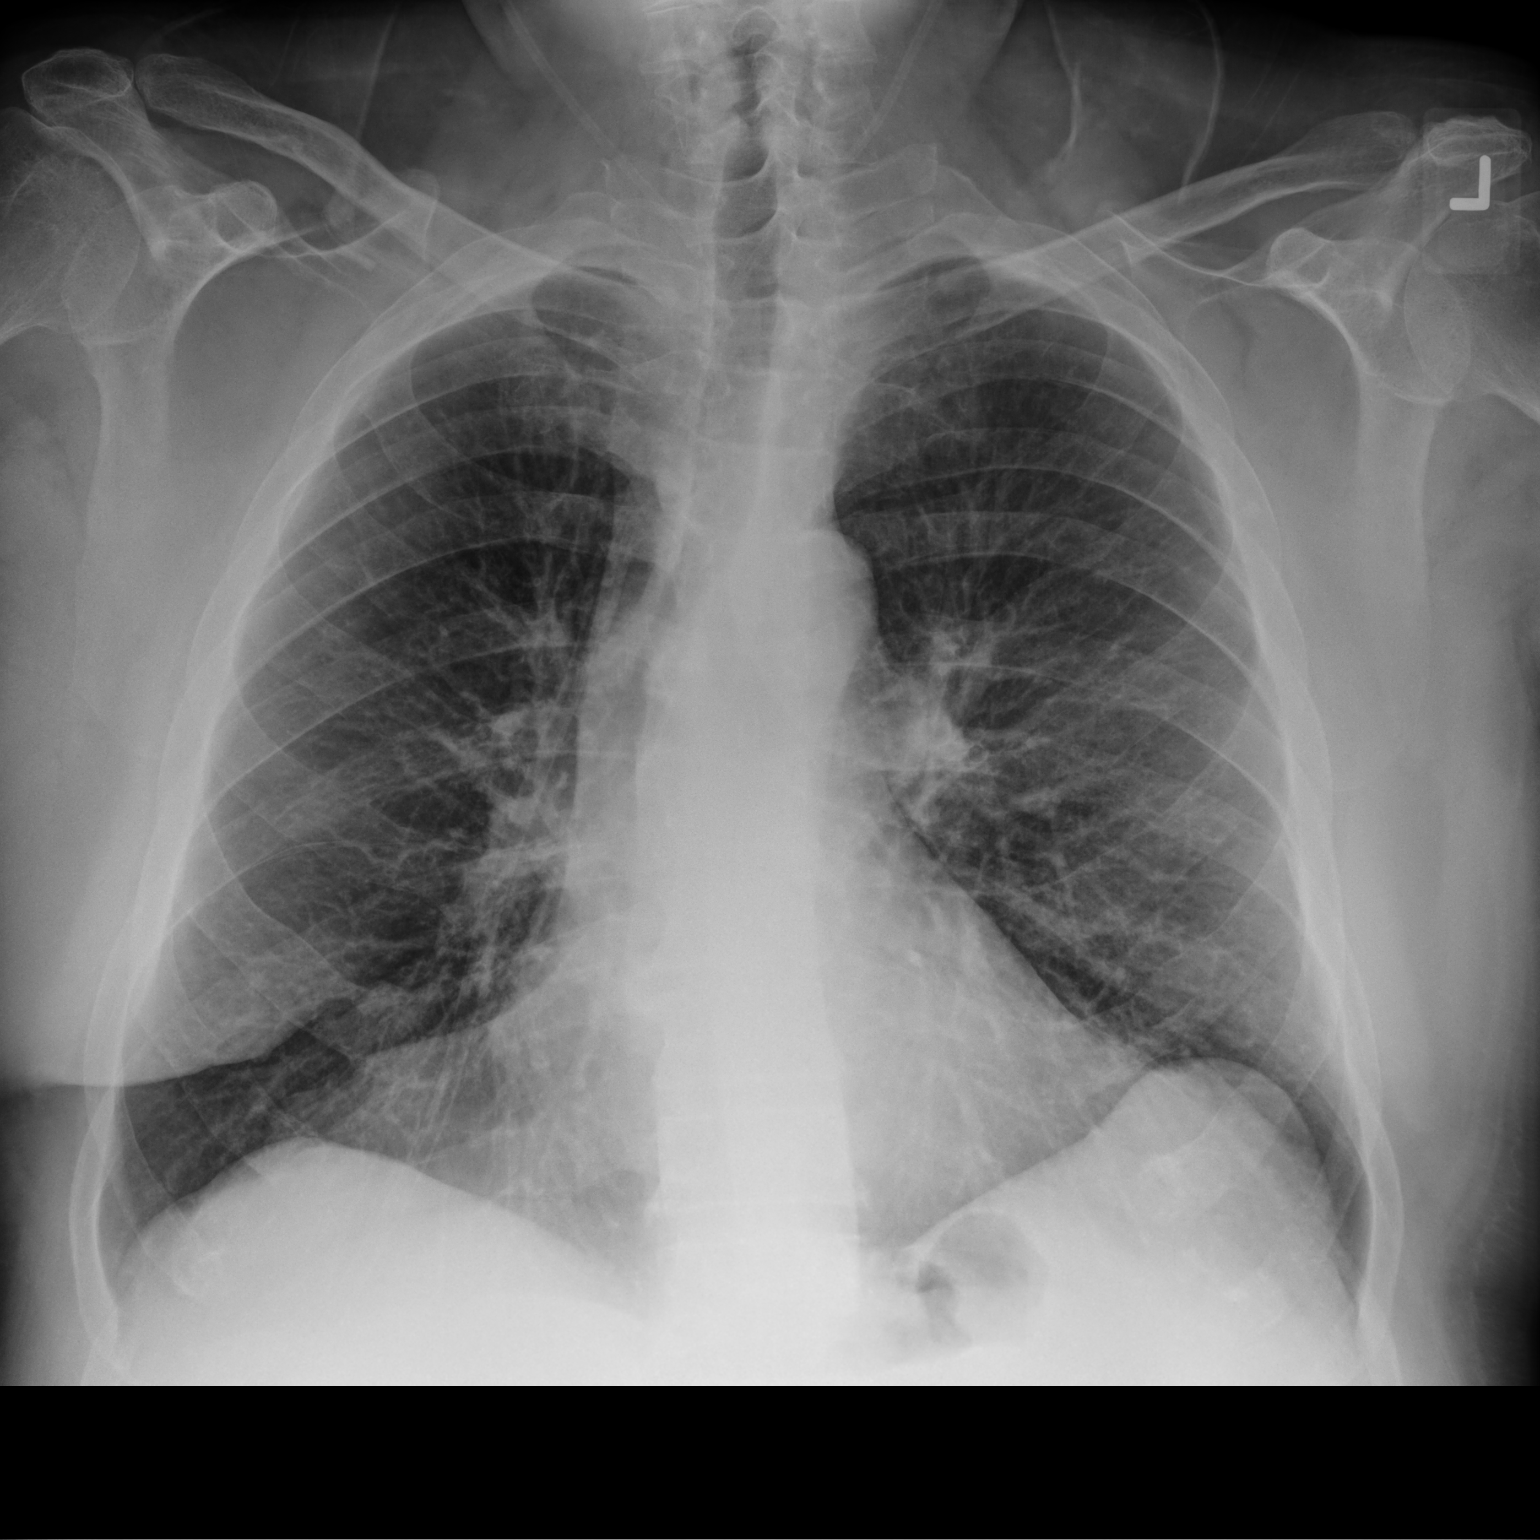

[chest lat]
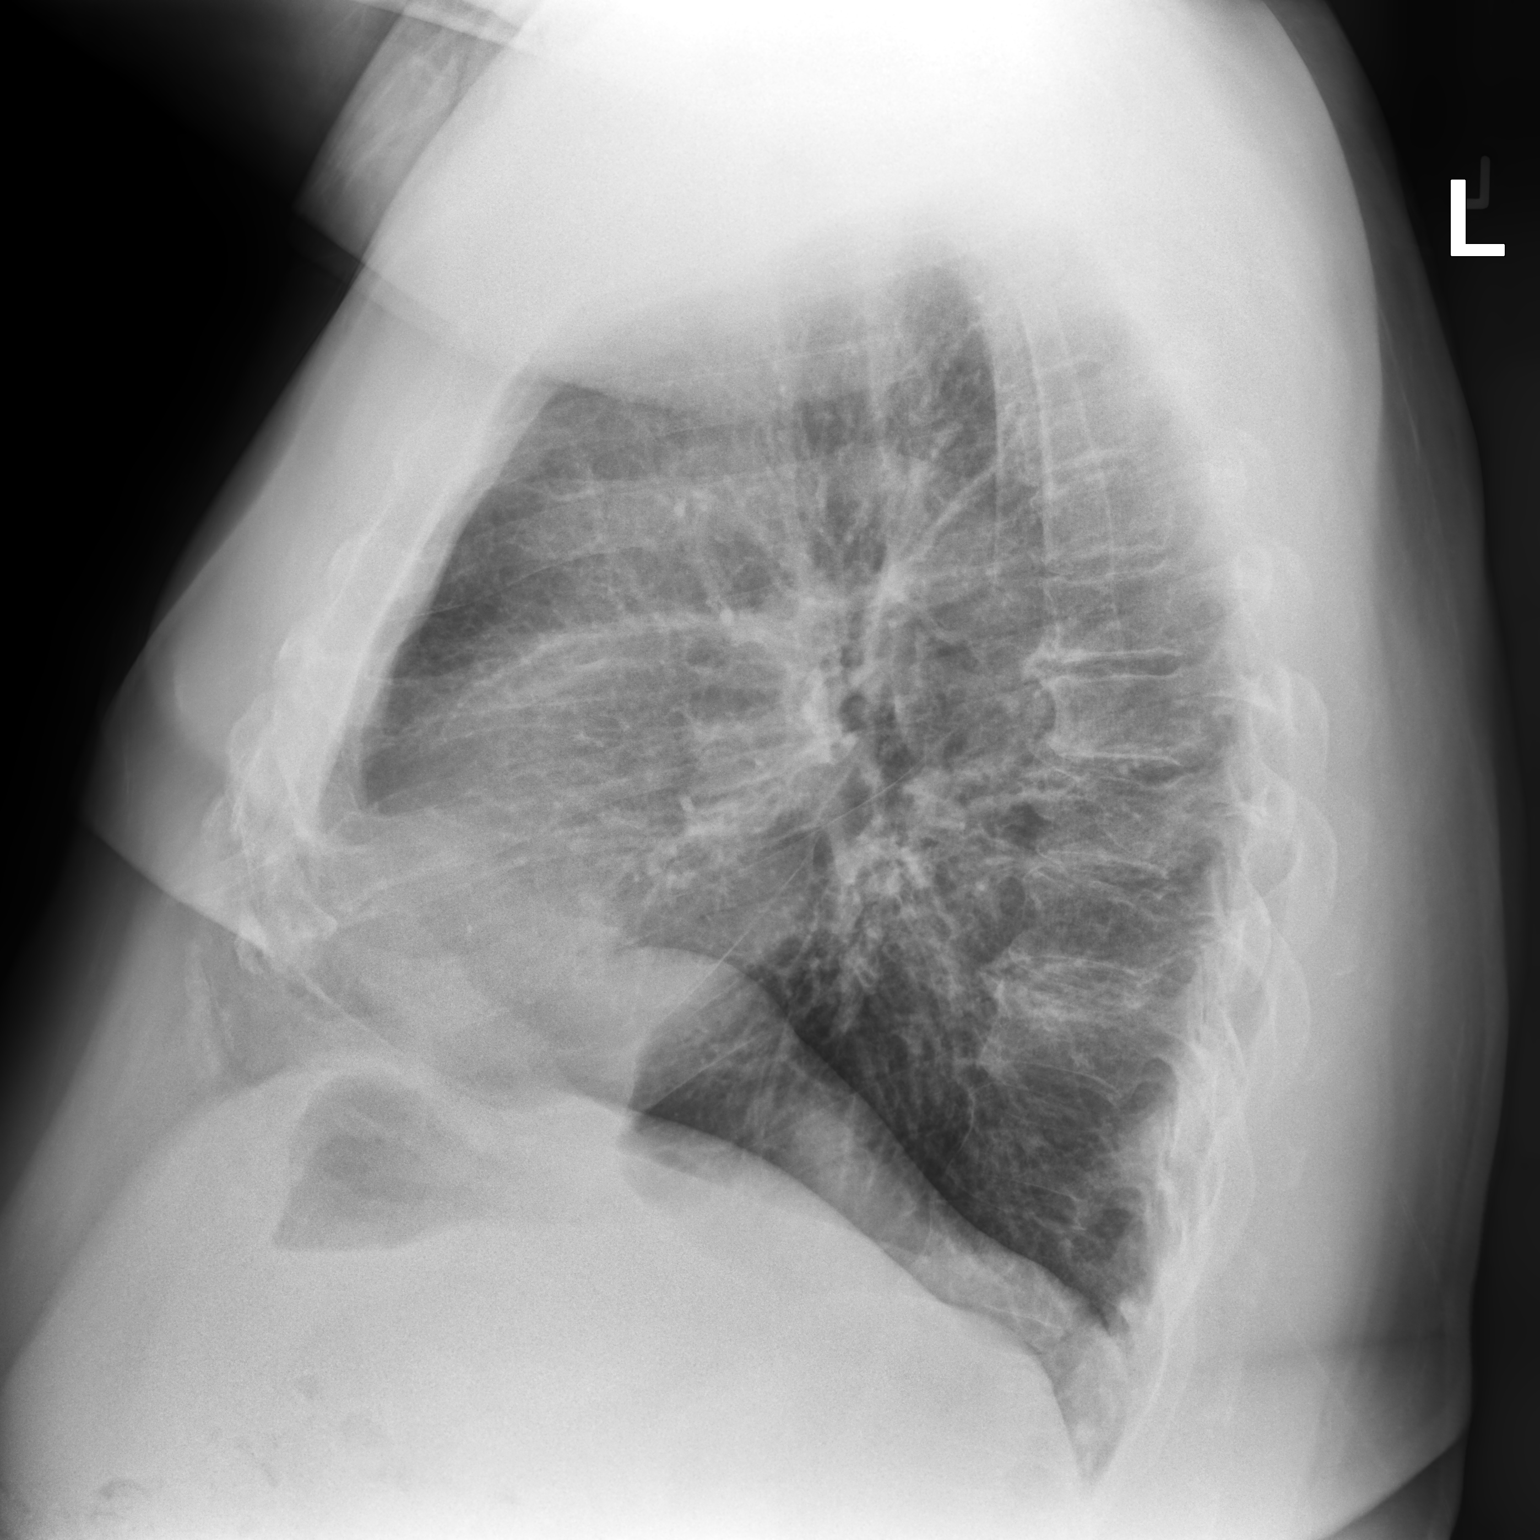

[2 of 2 positions shown; findings below may reference images not displayed]

FINDINGS: Normal heart size. No pleural effusion, interstitial edema or
airspace consolidation. Eventration of the left hemidiaphragm is
again noted. Mild spondylosis within the thoracic spine.
IMPRESSION: No active cardiopulmonary abnormalities.

## 2022-01-15 MED ORDER — UMECLIDINIUM BROMIDE 62.5 MCG/ACT IN AEPB: 1.0000 | INHALATION_SPRAY | Freq: Every day | RESPIRATORY_TRACT | 5 refills | Status: DC

## 2022-01-15 NOTE — Assessment & Plan Note (Signed)
No concerning nodule seen on his most recent CT November 22

## 2022-01-15 NOTE — Assessment & Plan Note (Signed)
Trial of Stiolto but he prefers the Incruse.  We will continue this and order through his pharmacy.

## 2022-01-15 NOTE — Assessment & Plan Note (Signed)
Some sort of nontraumatic injury to his thumb with significant edema, tenderness and "click" when he flexes.  He has had similar in the past, had to be injected.  I will refer him to sports medicine to troubleshoot.

## 2022-01-15 NOTE — Patient Instructions (Addendum)
Regulations on good compliance with your CPAP machine.  Keep up the good work.  Wear it every night reliably.  You should be able to take it to [place] with a plug adapter. We will reorder your Incruse and continue this once daily. Keep albuterol available to use 2 puffs if needed for shortness of breath, chest tightness, wheezing. We do not need to repeat your CT scan of the chest at this time. We will refer you to sports medicine to evaluate your injured right thumb Follow with Dr Lamonte Sakai in 6 months or sooner if you have any problems

## 2022-01-15 NOTE — Assessment & Plan Note (Signed)
He has been wearing his CPAP machine reliably, 87% compliance.  He does feel that he is getting clinical benefit, more energy during the day, sleeping more soundly through the night.  We will continue.  Follow-up with him in 6 months to assess status compliance

## 2022-01-15 NOTE — Progress Notes (Signed)
Subjective:    Patient ID: Manuel Leonard, male    DOB: Jan 23, 1951, 71 y.o.   MRN: [id number]  HPI 71 yo former smoker (46 pk-yrs), hx HTN, GERD, COPD, renal cysts and hematuria. Was discovered to have a 2-67m LLL nodule, probably calcified.   ROV 12/06/21 --71year old gentleman, smoker (48 pack years) whom I have seen in the past for COPD and calcified pulmonary nodular disease.  He is reestablishing care after hospitalization in November 2022.  Past medical history also significant for atrial fibrillation, hypertension with diastolic dysfunction, prior DVT, hyperlipidemia, diabetes, GERD. He was hospitalized with influenza A and CAP, associated hypoxemic respiratory failure requiring intubation and mechanical ventilation.  There is suspicion for obstructive sleep apnea and a polysomnogram has been performed. Currently managed on Incruse.  He has supplemental oxygen 2 L/min.  He is using albuterol very rarely rarely   PSG 08/04/2021 reviewed, showed low AHI but this was done on supplemental oxygen.  His RDI was 19.3/h CPAP titration polysomnogram 11/12/2021 reviewed by me titrated him to CPAP 13 cmH2O  CT chest 06/30/2021 reviewed by me.  The contrast bolus was inadequate to assess for pulmonary embolism.  There were no mediastinal nodes or hilar nodes.  No infiltrates or masses.  No change tiny calcified pulmonary nodules.  ROV 01/15/22 --follow-up visit 71year old gentleman with history of former tobacco use.  He has COPD, calcified pulmonary nodular disease and obstructive sleep apnea.  I saw him in April at which time we were starting CPAP 13 cmH2O.  He reports today that he has been wearing. More energy during the day. Gets up a few times a night to urinate.  He had been on Incruse and we tried changing to SDarden Restaurantsto see if you get more benefit. He prefers the incruse. He denies any wheeze, sometimes coughs. Still not smoking.  We were able to discontinue his oxygen.  Compliance report  available, shows good control of apneic events.  He is using 87% of the time, 57% of time for greater than 4 hours.    Review of Systems  Constitutional:  Negative for fever and unexpected weight change.  HENT:  Negative for congestion, dental problem, ear pain, nosebleeds, postnasal drip, rhinorrhea, sinus pressure, sneezing, sore throat and trouble swallowing.   Eyes:  Negative for redness and itching.  Respiratory:  Negative for cough, chest tightness, shortness of breath and wheezing.   Cardiovascular:  Negative for chest pain, palpitations and leg swelling.  Gastrointestinal:  Negative for nausea and vomiting.  Genitourinary:  Negative for dysuria.  Musculoskeletal:  Negative for joint swelling.  Skin:  Negative for rash.  Neurological:  Negative for headaches.  Hematological:  Does not bruise/bleed easily.  Psychiatric/Behavioral:  Negative for dysphoric mood. The patient is not nervous/anxious.    Past Medical History:  Diagnosis Date   Abnormal EKG 12/03/2017   Acute bronchitis 12/29/2016   Adenomatous polyps    Chest discomfort 12/03/2017   COPD (chronic obstructive pulmonary disease) ([facility])    [place] 12/03/2017   Emphysema/COPD ([facility])    GERD (gastroesophageal reflux disease)    Hyperlipidemia 12/21/2015   Hypertension    not on meds    Leukocytosis 09/27/2014   Multiple pulmonary nodules    Pneumonia    hx of 2017    [place] 09/27/2014   Tobacco abuse    Umbilical hernia 26/27/0350    Family History  Problem Relation Age of Onset   Lung cancer Brother  Stomach cancer Brother    Cancer Father      Social History   Socioeconomic History   Marital status: Married    Spouse name: Not on file   Number of children: Not on file   Years of education: Not on file   Highest education level: Not on file  Occupational History   Occupation: Regulatory affairs officer  Tobacco Use   Smoking status: Former    Packs/day: 2.00    Years: 48.00    Pack years:  96.00    Types: Cigarettes    Quit date: 06/30/2021    Years since quitting: 0.5    Passive exposure: Past   Smokeless tobacco: Never  Vaping Use   Vaping Use: Never used  Substance and Sexual Activity   Alcohol use: Yes    Alcohol/week: 0.0 standard drinks    Comment: occasional glass of wine or beer    Drug use: No   Sexual activity: Not on file  Other Topics Concern   Not on file  Social History Narrative   Not on file   Social Determinants of Health   Financial Resource Strain: Not on file  Food Insecurity: Not on file  Transportation Needs: Not on file  Physical Activity: Not on file  Stress: Not on file  Social Connections: Not on file  Intimate Partner Violence: Not on file  Never exposed to TB to his knowledge Originally from [place], has lived here for 30 yrs.  He works in a [employer]   No Known Allergies   Outpatient Medications Prior to Visit  Medication Sig Dispense Refill   albuterol (VENTOLIN HFA) 108 (90 Base) MCG/ACT inhaler Inhale 2 puffs into the lungs every 6 (six) hours as needed for wheezing or shortness of breath. 8 g 2   albuterol (VENTOLIN HFA) 108 (90 Base) MCG/ACT inhaler Inhale 2 puffs into the lungs every 6 (six) hours as needed for wheezing or shortness of breath. 8 g 6   aspirin 81 MG EC tablet Take 1 tablet (81 mg total) by mouth daily. 30 tablet 3   buPROPion (WELLBUTRIN SR) 150 MG 12 hr tablet TAKE 1 TABLET(150 MG) BY MOUTH TWICE DAILY 60 tablet 1   ibuprofen (ADVIL) 200 MG tablet Take 600 mg by mouth every 6 (six) hours as needed for mild pain.     ipratropium-albuterol (DUONEB) 0.5-2.5 (3) MG/3ML SOLN Take 3 mLs by nebulization every 6 (six) hours as needed (for shortness of breath or wheezing). 75 mL 3   metFORMIN (GLUCOPHAGE) 500 MG tablet Take 2 (two) times daily with a meal by mouth.     omeprazole (PRILOSEC) 20 MG capsule Take 1 capsule (20 mg total) by mouth 2 (two) times daily before a meal. 30 capsule 0   ondansetron (ZOFRAN)  4 MG tablet Take 1 tablet (4 mg total) by mouth every 8 (eight) hours as needed for nausea or vomiting. 20 tablet 0   rosuvastatin (CRESTOR) 20 MG tablet Take 1 tablet (20 mg total) by mouth daily. 30 tablet 11   TRULICITY 7.61 YW/7.3XT SOPN Inject 0.75 mg into the skin once a week. Wednesday     umeclidinium bromide (INCRUSE ELLIPTA) 62.5 MCG/ACT AEPB Inhale 1 puff into the lungs daily. 30 each 2   metoprolol tartrate (LOPRESSOR) 100 MG tablet Take 1 tablet (100 mg total) by mouth once for 1 dose. Please take 2 hours before your CT. 1 tablet 0   No facility-administered medications prior to visit.  Objective:   Physical Exam Vitals:   01/15/22 0859  BP: 134/72  Pulse: (!) 103  Temp: 98.2 F (36.8 C)  TempSrc: Oral  SpO2: 97%  Weight: (!) 318 lb 6.4 oz (144.4 kg)    Gen: Pleasant, obese, in no distress,  normal affect  ENT: No lesions,  mouth clear,  oropharynx clear, no postnasal drip  Neck: No JVD, no stridor  Lungs: No use of accessory muscles, no wheezes  Cardiovascular: RRR, heart sounds normal, no murmur or gallops, no peripheral edema  Musculoskeletal: His proximal right thumb is swollen and tender.  No erythema.  He has some difficulty flexing  Neuro: alert, non focal  Skin: Warm, no lesions or rashes       Assessment & Plan:  OSA (obstructive sleep apnea) He has been wearing his CPAP machine reliably, 87% compliance.  He does feel that he is getting clinical benefit, more energy during the day, sleeping more soundly through the night.  We will continue.  Follow-up with him in 6 months to assess status compliance  Multiple pulmonary nodules No concerning nodule seen on his most recent CT November 22  COPD (chronic obstructive pulmonary disease) Trial of Stiolto but he prefers the Incruse.  We will continue this and order through his pharmacy.  Thumb injury Some sort of nontraumatic injury to his thumb with significant edema, tenderness and "click"  when he flexes.  He has had similar in the past, had to be injected.  I will refer him to sports medicine to troubleshoot.  Baltazar Apo, MD, PhD 01/15/2022, 9:19 AM [facility] [phone] or if no answer [phone]

## 2022-01-22 NOTE — Procedures (Signed)
   NAME: Manuel Leonard DATE OF BIRTH:  07-30-1951 MEDICAL RECORD NUMBER [id number]  LOCATION: [facility]  PHYSICIAN: Marius Ditch  DATE OF STUDY: 11/12/2021  SLEEP STUDY TYPE: Positive Airway Pressure Titration               REFERRING PHYSICIAN: Marius Ditch, MD  EPWORTH SLEEPINESS SCORE:  5 HEIGHT: '5\' 11"'$  (180.3 cm)  WEIGHT: 291 lb (132 kg)    Body mass index is 40.59 kg/m.  NECK SIZE: 18.5 in.  CLINICAL INFORMATION Coleston Dirosa is a 71 year old Male and was referred to the sleep center for evaluation of CPAP efficacy and to determine need for nocturnal oxygen. He has an established diagnosis of OSA and OHS based on PSG 08/04/21.  MEDICATIONS Patient self administered medications include: N/A. Medications administered during study include No sleep medicine administered.  SLEEP STUDY TECHNIQUE The patient underwent an attended overnight polysomnography titration to assess the effects of CPAP therapy. The following variables were monitored: EEG(C4-A1, C3-A2, O1-A2, O2-A1), EOG, submental and leg EMG, ECG, oxyhemoglobin saturation by pulse oximetry, thoracic and abdominal respiratory effort belts, nasal/oral airflow by pressure sensor, body position sensor and snoring sensor. CPAP pressure was titrated to eliminate apneas, hypopneas and oxygen desaturation. Hypopneas were scored per AASM definition IB (4% desaturation)  TECHNICIAN COMMENTS Comments added by Technician: Patient had difficulty initiating sleep. Comments added by Scorer: N/A  SLEEP ARCHITECTURE The study was initiated at 11:04:53 PM and terminated at 5:37:51 AM. Total recorded time was 393 minutes. EEG confirmed total sleep time was 212.5 minutes yielding a sleep efficiency of 54.1%. Sleep onset after lights out was 24.2 minutes with a REM latency of 222.5 minutes. The patient spent 22.8% of the night in stage N1 sleep, 62.4% in stage N2 sleep, 0.0% in stage N3 and 14.8% in REM.  The Arousal Index was 14.1/hour.  RESPIRATORY PARAMETERS The overall AHI was 4.5 per hour, and the RDI was 7.6 events/hour with a central apnea index of 0per hour. The most appropriate setting of CPAP was 13 cm H2O. At this setting, the sleep efficiency was 95% and the patient was supine for 100%. In addition, on this setting, the AHI was 0 events per hour, the RDI was 0 events/hour (with 0 central events), the arousal index was 0 per hour and the oxygen nadir was 92.0% during sleep.  LEG MOVEMENT DATA The total leg movements were 0 with a resulting leg movement index of 0.0/hr. Associated arousal with leg movement index was 0.0/hr.  CARDIAC DATA The underlying cardiac rhythm was most consistent with sinus rhythm. Mean heart rate during sleep was 94.0 bpm. Additional rhythm abnormalities include None.  IMPRESSIONS - Obstructive Sleep apnea(OSA) and Obesity hypoventiliation syndrome. Optimal pressure attained.  DIAGNOSIS - Obstructive Sleep Apnea (G47.33) - Obesity Hypoventilation Syndrome  RECOMMENDATIONS - Trial of CPAP therapy on 13 cm H2O with a Large size Fisher&Paykel Full Face Simplus mask and heated humidification. - Avoid alcohol, sedatives and other CNS depressants that may worsen sleep apnea and disrupt normal sleep architecture. - Return to Sleep Center for re-evaluation after 4-10 weeks of therapy  Marius Ditch Sleep specialist, [facility]  ELECTRONICALLY SIGNED ON:  01/22/2022, 3:20 PM [place] PH: [phone]   FX: [phone] [place]

## 2022-01-23 ENCOUNTER — Encounter: Payer: Self-pay | Admitting: Family Medicine

## 2022-01-28 ENCOUNTER — Ambulatory Visit: Payer: Medicare Other | Admitting: Emergency Medicine

## 2022-06-18 DIAGNOSIS — Z23 Encounter for immunization: Secondary | ICD-10-CM | POA: Diagnosis not present

## 2022-07-15 ENCOUNTER — Ambulatory Visit (INDEPENDENT_AMBULATORY_CARE_PROVIDER_SITE_OTHER): Payer: Medicare Other | Admitting: Podiatry

## 2022-07-15 ENCOUNTER — Encounter: Payer: Self-pay | Admitting: Podiatry

## 2022-07-15 DIAGNOSIS — L600 Ingrowing nail: Secondary | ICD-10-CM | POA: Diagnosis not present

## 2022-07-15 DIAGNOSIS — I2489 Other forms of acute ischemic heart disease: Secondary | ICD-10-CM | POA: Diagnosis not present

## 2022-07-15 MED ORDER — NEOMYCIN-POLYMYXIN-HC 1 % OT SOLN: OTIC | 1 refills | Status: DC

## 2022-07-15 NOTE — Patient Instructions (Signed)

## 2022-07-20 NOTE — Progress Notes (Signed)
Subjective:  Patient ID: Manuel Leonard, male    DOB: 1951-07-22,  MRN: [id number] HPI Chief Complaint  Patient presents with   Toe Pain    Hallux left - medial border, ingrown, was in [place] couple months ago and it was cut our there, but the last 2 weeks its been really sore again   New Patient (Initial Visit)   Diabetes    Last A1c was 6.7    71 y.o. male presents with the above complaint.   ROS: Denies fever chills nausea vomit muscle aches pains calf pain back pain chest pain shortness of breath  Past Medical History:  Diagnosis Date   Abnormal EKG 12/03/2017   Acute bronchitis 12/29/2016   Adenomatous polyps    Chest discomfort 12/03/2017   COPD (chronic obstructive pulmonary disease) ([place])    Diabetes mellitus ([place]) 12/03/2017   Emphysema/COPD ([place])    GERD (gastroesophageal reflux disease)    Hyperlipidemia 12/21/2015   Hypertension    not on meds    Leukocytosis 09/27/2014   Multiple pulmonary nodules    Pneumonia    hx of 2017    Sepsis ([place]) 09/27/2014   Tobacco abuse    Umbilical hernia 02/18/349   Past Surgical History:  Procedure Laterality Date   APPENDECTOMY     HEMORRHOIDECTOMY WITH HEMORRHOID BANDING     HERNIA REPAIR     bilateral    INSERTION OF MESH N/A 10/21/2016   Procedure: INSERTION OF MESH PATCH;  Surgeon: Armandina Gemma, MD;  Location: [facility];  Service: General;  Laterality: N/A;   right index finger surgery - partial amputation      TONSILLECTOMY     UMBILICAL HERNIA REPAIR N/A 10/21/2016   Procedure: UMBILICAL HERNIA REPAIR;  Surgeon: Armandina Gemma, MD;  Location: [facility];  Service: General;  Laterality: N/A;    Current Outpatient Medications:    NEOMYCIN-POLYMYXIN-HYDROCORTISONE (CORTISPORIN) 1 % SOLN OTIC solution, Apply 1-2 drops to toe BID after soaking, Disp: 10 mL, Rfl: 1   albuterol (VENTOLIN HFA) 108 (90 Base) MCG/ACT inhaler, Inhale 2 puffs into the lungs every 6 (six) hours as needed for wheezing or shortness of breath., Disp: 8 g, Rfl:  2   albuterol (VENTOLIN HFA) 108 (90 Base) MCG/ACT inhaler, Inhale 2 puffs into the lungs every 6 (six) hours as needed for wheezing or shortness of breath., Disp: 8 g, Rfl: 6   aspirin 81 MG EC tablet, Take 1 tablet (81 mg total) by mouth daily., Disp: 30 tablet, Rfl: 3   buPROPion (WELLBUTRIN SR) 150 MG 12 hr tablet, TAKE 1 TABLET(150 MG) BY MOUTH TWICE DAILY, Disp: 60 tablet, Rfl: 1   ibuprofen (ADVIL) 200 MG tablet, Take 600 mg by mouth every 6 (six) hours as needed for mild pain., Disp: , Rfl:    ipratropium-albuterol (DUONEB) 0.5-2.5 (3) MG/3ML SOLN, Take 3 mLs by nebulization every 6 (six) hours as needed (for shortness of breath or wheezing)., Disp: 75 mL, Rfl: 3   metFORMIN (GLUCOPHAGE) 500 MG tablet, Take 2 (two) times daily with a meal by mouth., Disp: , Rfl:    metoprolol tartrate (LOPRESSOR) 100 MG tablet, Take 1 tablet (100 mg total) by mouth once for 1 dose. Please take 2 hours before your CT., Disp: 1 tablet, Rfl: 0   omeprazole (PRILOSEC) 20 MG capsule, Take 1 capsule (20 mg total) by mouth 2 (two) times daily before a meal., Disp: 30 capsule, Rfl: 0   ondansetron (ZOFRAN) 4 MG tablet, Take 1 tablet (4 mg  total) by mouth every 8 (eight) hours as needed for nausea or vomiting., Disp: 20 tablet, Rfl: 0   rosuvastatin (CRESTOR) 20 MG tablet, Take 1 tablet (20 mg total) by mouth daily., Disp: 30 tablet, Rfl: 11   TRULICITY 7.56 EP/3.2RJ SOPN, Inject 0.75 mg into the skin once a week. Wednesday, Disp: , Rfl:    umeclidinium bromide (INCRUSE ELLIPTA) 62.5 MCG/ACT AEPB, Inhale 1 puff into the lungs daily., Disp: 30 each, Rfl: 5  No Known Allergies Review of Systems Objective:  There were no vitals filed for this visit.  General: Well developed, nourished, in no acute distress, alert and oriented x3   Dermatological: Skin is warm, dry and supple bilateral. Nails x 10 are well maintained; remaining integument appears unremarkable at this time. There are no open sores, no preulcerative  lesions, no rash or signs of infection present.  Ingrown toenail with mild to moderate erythema no purulence no malodor tibia-fibula border of the hallux left exquisitely tender to palpation  Vascular: Dorsalis Pedis artery and Posterior Tibial artery pedal pulses are 2/4 bilateral with immedate capillary fill time. Pedal hair growth present. No varicosities and no lower extremity edema present bilateral.   Neruologic: Grossly intact via light touch bilateral. Vibratory intact via tuning fork bilateral. Protective threshold with Semmes Wienstein monofilament intact to all pedal sites bilateral. Patellar and Achilles deep tendon reflexes 2+ bilateral. No Babinski or clonus noted bilateral.   Musculoskeletal: No gross boney pedal deformities bilateral. No pain, crepitus, or limitation noted with foot and ankle range of motion bilateral. Muscular strength 5/5 in all groups tested bilateral.  Gait: Unassisted, Nonantalgic.    Radiographs:  None taken  Assessment & Plan:   Assessment: Ingrown toenail to the inferior border hallux left  Plan: Chemical matricectomy was performed today after local anesthetic was administered tolerated procedure well without complications.  He was given both oral and written home-going instructions for the care and soaking of his toe as well as a prescription for Cortisporin otic to be applied twice daily after soaking.  Will follow-up with him in 3 weeks     Sander Remedios T. [place], [place]

## 2022-07-29 ENCOUNTER — Ambulatory Visit (INDEPENDENT_AMBULATORY_CARE_PROVIDER_SITE_OTHER): Payer: Medicare Other | Admitting: Podiatry

## 2022-07-29 ENCOUNTER — Encounter: Payer: Self-pay | Admitting: Podiatry

## 2022-07-29 DIAGNOSIS — L600 Ingrowing nail: Secondary | ICD-10-CM

## 2022-07-29 DIAGNOSIS — Z9889 Other specified postprocedural states: Secondary | ICD-10-CM

## 2022-07-29 NOTE — Progress Notes (Signed)
He presents today stating that the toe is doing good is doing better.  Continues to soak in Betadine and warm water.  He covers twice daily.  Objective: Vital signs stable alert oriented x 3 there are still some mild edema present skin some maceration from dressing it too often most likely.  I see no signs of purulence no granulation tissue is present.  Assessment: Well-healing matrixectomy hallux left.  Plan: Discussed etiology pathology and surgical therapies at this point recommended he discontinue Betadine start with Epsom salts and warm water covered during the day but leave open at bedtime continue to do so until completely resolved.

## 2022-08-29 DIAGNOSIS — U071 COVID-19: Secondary | ICD-10-CM | POA: Diagnosis not present

## 2022-08-29 DIAGNOSIS — R051 Acute cough: Secondary | ICD-10-CM | POA: Diagnosis not present

## 2022-09-16 DIAGNOSIS — Z8616 Personal history of COVID-19: Secondary | ICD-10-CM | POA: Diagnosis not present

## 2022-09-16 DIAGNOSIS — J449 Chronic obstructive pulmonary disease, unspecified: Secondary | ICD-10-CM | POA: Diagnosis not present

## 2022-09-16 DIAGNOSIS — R635 Abnormal weight gain: Secondary | ICD-10-CM | POA: Diagnosis not present

## 2022-09-16 DIAGNOSIS — J189 Pneumonia, unspecified organism: Secondary | ICD-10-CM | POA: Diagnosis not present

## 2022-10-31 ENCOUNTER — Other Ambulatory Visit: Payer: Self-pay

## 2022-10-31 DIAGNOSIS — R109 Unspecified abdominal pain: Secondary | ICD-10-CM | POA: Diagnosis not present

## 2022-10-31 DIAGNOSIS — E782 Mixed hyperlipidemia: Secondary | ICD-10-CM | POA: Diagnosis not present

## 2022-10-31 DIAGNOSIS — R1011 Right upper quadrant pain: Secondary | ICD-10-CM

## 2022-11-01 ENCOUNTER — Ambulatory Visit (HOSPITAL_BASED_OUTPATIENT_CLINIC_OR_DEPARTMENT_OTHER)
Admission: RE | Admit: 2022-11-01 | Discharge: 2022-11-01 | Disposition: A | Discharge status: Home / Self Care | Payer: Medicare Other | Source: Ambulatory Visit | Attending: Family Medicine | Admitting: Family Medicine

## 2022-11-01 DIAGNOSIS — K8020 Calculus of gallbladder without cholecystitis without obstruction: Secondary | ICD-10-CM | POA: Diagnosis not present

## 2022-11-01 DIAGNOSIS — R1011 Right upper quadrant pain: Secondary | ICD-10-CM | POA: Insufficient documentation

## 2022-11-01 DIAGNOSIS — R109 Unspecified abdominal pain: Secondary | ICD-10-CM | POA: Diagnosis not present

## 2022-11-01 DIAGNOSIS — N281 Cyst of kidney, acquired: Secondary | ICD-10-CM | POA: Diagnosis not present

## 2022-11-01 DIAGNOSIS — K760 Fatty (change of) liver, not elsewhere classified: Secondary | ICD-10-CM | POA: Diagnosis not present

## 2023-02-10 DIAGNOSIS — K644 Residual hemorrhoidal skin tags: Secondary | ICD-10-CM | POA: Diagnosis not present

## 2023-02-10 DIAGNOSIS — K5909 Other constipation: Secondary | ICD-10-CM | POA: Diagnosis not present

## 2023-02-10 DIAGNOSIS — K602 Anal fissure, unspecified: Secondary | ICD-10-CM | POA: Diagnosis not present

## 2023-06-19 DIAGNOSIS — Z23 Encounter for immunization: Secondary | ICD-10-CM | POA: Diagnosis not present

## 2023-06-29 ENCOUNTER — Other Ambulatory Visit: Payer: Self-pay

## 2023-06-29 ENCOUNTER — Emergency Department (HOSPITAL_BASED_OUTPATIENT_CLINIC_OR_DEPARTMENT_OTHER): Payer: Medicare Other | Admitting: Radiology

## 2023-06-29 ENCOUNTER — Encounter (HOSPITAL_BASED_OUTPATIENT_CLINIC_OR_DEPARTMENT_OTHER): Payer: Self-pay

## 2023-06-29 ENCOUNTER — Inpatient Hospital Stay (HOSPITAL_BASED_OUTPATIENT_CLINIC_OR_DEPARTMENT_OTHER)
Admission: EM | Admit: 2023-06-29 | Discharge: 2023-07-02 | DRG: 190 | Disposition: A | Discharge status: Home / Self Care | Payer: Medicare Other | Attending: Internal Medicine | Admitting: Internal Medicine

## 2023-06-29 DIAGNOSIS — K219 Gastro-esophageal reflux disease without esophagitis: Secondary | ICD-10-CM | POA: Diagnosis not present

## 2023-06-29 DIAGNOSIS — Z720 Tobacco use: Secondary | ICD-10-CM | POA: Diagnosis not present

## 2023-06-29 DIAGNOSIS — T380X5A Adverse effect of glucocorticoids and synthetic analogues, initial encounter: Secondary | ICD-10-CM | POA: Diagnosis present

## 2023-06-29 DIAGNOSIS — J439 Emphysema, unspecified: Secondary | ICD-10-CM | POA: Diagnosis present

## 2023-06-29 DIAGNOSIS — N179 Acute kidney failure, unspecified: Secondary | ICD-10-CM | POA: Diagnosis not present

## 2023-06-29 DIAGNOSIS — Z7984 Long term (current) use of oral hypoglycemic drugs: Secondary | ICD-10-CM | POA: Diagnosis not present

## 2023-06-29 DIAGNOSIS — B349 Viral infection, unspecified: Secondary | ICD-10-CM | POA: Diagnosis not present

## 2023-06-29 DIAGNOSIS — B9789 Other viral agents as the cause of diseases classified elsewhere: Secondary | ICD-10-CM | POA: Diagnosis present

## 2023-06-29 DIAGNOSIS — K5900 Constipation, unspecified: Secondary | ICD-10-CM | POA: Diagnosis present

## 2023-06-29 DIAGNOSIS — Z86718 Personal history of other venous thrombosis and embolism: Secondary | ICD-10-CM

## 2023-06-29 DIAGNOSIS — Z1152 Encounter for screening for COVID-19: Secondary | ICD-10-CM | POA: Diagnosis not present

## 2023-06-29 DIAGNOSIS — N400 Enlarged prostate without lower urinary tract symptoms: Secondary | ICD-10-CM | POA: Diagnosis present

## 2023-06-29 DIAGNOSIS — Z7982 Long term (current) use of aspirin: Secondary | ICD-10-CM

## 2023-06-29 DIAGNOSIS — E0810 Diabetes mellitus due to underlying condition with ketoacidosis without coma: Secondary | ICD-10-CM | POA: Diagnosis not present

## 2023-06-29 DIAGNOSIS — Y92239 Unspecified place in hospital as the place of occurrence of the external cause: Secondary | ICD-10-CM | POA: Diagnosis present

## 2023-06-29 DIAGNOSIS — R509 Fever, unspecified: Secondary | ICD-10-CM | POA: Diagnosis not present

## 2023-06-29 DIAGNOSIS — E785 Hyperlipidemia, unspecified: Secondary | ICD-10-CM | POA: Diagnosis present

## 2023-06-29 DIAGNOSIS — Z6841 Body Mass Index (BMI) 40.0 and over, adult: Secondary | ICD-10-CM

## 2023-06-29 DIAGNOSIS — Z7985 Long-term (current) use of injectable non-insulin antidiabetic drugs: Secondary | ICD-10-CM | POA: Diagnosis not present

## 2023-06-29 DIAGNOSIS — A493 Mycoplasma infection, unspecified site: Secondary | ICD-10-CM | POA: Diagnosis present

## 2023-06-29 DIAGNOSIS — Z20822 Contact with and (suspected) exposure to covid-19: Secondary | ICD-10-CM | POA: Diagnosis not present

## 2023-06-29 DIAGNOSIS — R059 Cough, unspecified: Secondary | ICD-10-CM | POA: Diagnosis not present

## 2023-06-29 DIAGNOSIS — Z79899 Other long term (current) drug therapy: Secondary | ICD-10-CM | POA: Diagnosis not present

## 2023-06-29 DIAGNOSIS — I10 Essential (primary) hypertension: Secondary | ICD-10-CM | POA: Diagnosis not present

## 2023-06-29 DIAGNOSIS — E1165 Type 2 diabetes mellitus with hyperglycemia: Secondary | ICD-10-CM | POA: Diagnosis present

## 2023-06-29 DIAGNOSIS — Z800 Family history of malignant neoplasm of digestive organs: Secondary | ICD-10-CM

## 2023-06-29 DIAGNOSIS — J9601 Acute respiratory failure with hypoxia: Secondary | ICD-10-CM | POA: Diagnosis present

## 2023-06-29 DIAGNOSIS — Z794 Long term (current) use of insulin: Secondary | ICD-10-CM

## 2023-06-29 DIAGNOSIS — J441 Chronic obstructive pulmonary disease with (acute) exacerbation: Secondary | ICD-10-CM | POA: Diagnosis not present

## 2023-06-29 DIAGNOSIS — E119 Type 2 diabetes mellitus without complications: Secondary | ICD-10-CM

## 2023-06-29 DIAGNOSIS — Z801 Family history of malignant neoplasm of trachea, bronchus and lung: Secondary | ICD-10-CM | POA: Diagnosis not present

## 2023-06-29 DIAGNOSIS — R051 Acute cough: Secondary | ICD-10-CM | POA: Diagnosis not present

## 2023-06-29 DIAGNOSIS — E782 Mixed hyperlipidemia: Secondary | ICD-10-CM | POA: Diagnosis not present

## 2023-06-29 DIAGNOSIS — G4733 Obstructive sleep apnea (adult) (pediatric): Secondary | ICD-10-CM | POA: Diagnosis present

## 2023-06-29 LAB — CBC
HCT: 43.2 % (ref 39.0–52.0)
Hemoglobin: 14.2 g/dL (ref 13.0–17.0)
MCH: 28.3 pg (ref 26.0–34.0)
MCHC: 32.9 g/dL (ref 30.0–36.0)
MCV: 86.2 fL (ref 80.0–100.0)
Platelets: 188 10*3/uL (ref 150–400)
RBC: 5.01 MIL/uL (ref 4.22–5.81)
RDW: 14 % (ref 11.5–15.5)
WBC: 10 10*3/uL (ref 4.0–10.5)
nRBC: 0 % (ref 0.0–0.2)

## 2023-06-29 LAB — BASIC METABOLIC PANEL
Anion gap: 8 (ref 5–15)
Anion gap: 10 (ref 5–15)
BUN: 21 mg/dL (ref 8–23)
BUN: 24 mg/dL — ABNORMAL HIGH (ref 8–23)
CO2: 28 mmol/L (ref 22–32)
CO2: 27 mmol/L (ref 22–32)
Calcium: 9.7 mg/dL (ref 8.9–10.3)
Calcium: 9.8 mg/dL (ref 8.9–10.3)
Chloride: 97 mmol/L — ABNORMAL LOW (ref 98–111)
Chloride: 99 mmol/L (ref 98–111)
Creatinine, Ser: 1.41 mg/dL — ABNORMAL HIGH (ref 0.61–1.24)
Creatinine, Ser: 1.36 mg/dL — ABNORMAL HIGH (ref 0.61–1.24)
GFR, Estimated: 53 mL/min — ABNORMAL LOW (ref >60)
GFR, Estimated: 56 mL/min — ABNORMAL LOW (ref >60)
Glucose, Bld: 403 mg/dL — ABNORMAL HIGH (ref 70–99)
Glucose, Bld: 437 mg/dL — ABNORMAL HIGH (ref 70–99)
Potassium: 5.1 mmol/L (ref 3.5–5.1)
Potassium: 4.9 mmol/L (ref 3.5–5.1)
Sodium: 133 mmol/L — ABNORMAL LOW (ref 135–145)
Sodium: 136 mmol/L (ref 135–145)

## 2023-06-29 LAB — CBG MONITORING, ED: Glucose-Capillary: 500 mg/dL — ABNORMAL HIGH (ref 70–99)

## 2023-06-29 LAB — TROPONIN I (HIGH SENSITIVITY)
Troponin I (High Sensitivity): 6 ng/L (ref <18)
Troponin I (High Sensitivity): 6 ng/L (ref <18)

## 2023-06-29 LAB — RESP PANEL BY RT-PCR (RSV, FLU A&B, COVID)  RVPGX2
Influenza A by PCR: NEGATIVE
Influenza B by PCR: NEGATIVE
Resp Syncytial Virus by PCR: NEGATIVE
SARS Coronavirus 2 by RT PCR: NEGATIVE

## 2023-06-29 LAB — BRAIN NATRIURETIC PEPTIDE: B Natriuretic Peptide: 34.7 pg/mL (ref 0.0–100.0)

## 2023-06-29 MED ORDER — IPRATROPIUM-ALBUTEROL 0.5-2.5 (3) MG/3ML IN SOLN: 3.0000 mL | RESPIRATORY_TRACT | Status: DC | PRN

## 2023-06-29 MED ORDER — METHYLPREDNISOLONE SODIUM SUCC 125 MG IJ SOLR
125.0000 mg | Freq: Once | INTRAMUSCULAR | Status: AC
  Administered 2023-06-29: 125 mg via INTRAVENOUS
  Filled 2023-06-29: qty 2

## 2023-06-29 MED ORDER — INSULIN ASPART 100 UNIT/ML IJ SOLN
0.0000 [IU] | Freq: Three times a day (TID) | INTRAMUSCULAR | Status: DC
  Administered 2023-06-30: 2 [IU] via SUBCUTANEOUS

## 2023-06-29 MED ORDER — SODIUM CHLORIDE 0.9 % IV BOLUS
1000.0000 mL | Freq: Once | INTRAVENOUS | Status: AC
  Administered 2023-06-29: 1000 mL via INTRAVENOUS

## 2023-06-29 MED ORDER — INSULIN ASPART 100 UNIT/ML IJ SOLN: 0.0000 [IU] | Freq: Every day | INTRAMUSCULAR | Status: DC

## 2023-06-29 MED ORDER — NICOTINE 14 MG/24HR TD PT24
14.0000 mg | MEDICATED_PATCH | Freq: Every day | TRANSDERMAL | Status: DC
  Filled 2023-06-29: qty 1

## 2023-06-29 MED ORDER — INSULIN ASPART 100 UNIT/ML IJ SOLN
15.0000 [IU] | Freq: Once | INTRAMUSCULAR | Status: AC
  Administered 2023-06-29: 15 [IU] via SUBCUTANEOUS

## 2023-06-29 MED ORDER — IPRATROPIUM-ALBUTEROL 0.5-2.5 (3) MG/3ML IN SOLN
3.0000 mL | Freq: Once | RESPIRATORY_TRACT | Status: AC
  Administered 2023-06-29: 3 mL via RESPIRATORY_TRACT
  Filled 2023-06-29: qty 3

## 2023-06-29 NOTE — Progress Notes (Signed)
After treatment PEAK FLOW 150

## 2023-06-29 NOTE — ED Provider Notes (Signed)
[place] EMERGENCY DEPARTMENT AT [facility] Provider Note   CSN: [id number] Arrival date & time: 06/29/23  1352     History  Chief Complaint  Patient presents with   Shortness of Breath    Manuel Leonard is a 72 y.o. male.  With a past history of COPD presents to ED for shortness of breath and coughing.  1 week of ongoing shortness of breath and coughing.  Cough present productive with white sputum.  Also notes increased bilateral lower extremity edema.  Experienced fevers (38C) 1 day last week but none currently.  Denies chest pain nausea vomiting.  Was seen earlier today at urgent care and was directed here for further evaluation   Shortness of Breath      Home Medications Prior to Admission medications   Medication Sig Start Date End Date Taking? Authorizing Provider  albuterol (VENTOLIN HFA) 108 (90 Base) MCG/ACT inhaler Inhale 2 puffs into the lungs every 6 (six) hours as needed for wheezing or shortness of breath. 07/30/21   Cobb, Ruby Cola, NP  albuterol (VENTOLIN HFA) 108 (90 Base) MCG/ACT inhaler Inhale 2 puffs into the lungs every 6 (six) hours as needed for wheezing or shortness of breath. 12/06/21   Leslye Peer, MD  aspirin 81 MG EC tablet Take 1 tablet (81 mg total) by mouth daily. 01/22/18   Baldo Daub, MD  buPROPion (WELLBUTRIN SR) 150 MG 12 hr tablet TAKE 1 TABLET(150 MG) BY MOUTH TWICE DAILY 10/18/21   Cobb, Ruby Cola, NP  ibuprofen (ADVIL) 200 MG tablet Take 600 mg by mouth every 6 (six) hours as needed for mild pain.    [provider]  ipratropium-albuterol (DUONEB) 0.5-2.5 (3) MG/3ML SOLN Take 3 mLs by nebulization every 6 (six) hours as needed (for shortness of breath or wheezing). 07/30/21   Cobb, Ruby Cola, NP  metFORMIN (GLUCOPHAGE) 500 MG tablet Take 2 (two) times daily with a meal by mouth.    [provider]  metoprolol tartrate (LOPRESSOR) 100 MG tablet Take 1 tablet (100 mg total) by mouth once for 1 dose.  Please take 2 hours before your CT. 12/23/21 12/23/21  Baldo Daub, MD  NEOMYCIN-POLYMYXIN-HYDROCORTISONE (CORTISPORIN) 1 % SOLN OTIC solution Apply 1-2 drops to toe BID after soaking 07/15/22   Hyatt, Max T, DPM  omeprazole (PRILOSEC) 20 MG capsule Take 1 capsule (20 mg total) by mouth 2 (two) times daily before a meal. 07/30/21 07/30/22  Cobb, Ruby Cola, NP  ondansetron (ZOFRAN) 4 MG tablet Take 1 tablet (4 mg total) by mouth every 8 (eight) hours as needed for nausea or vomiting. 07/30/21   Cobb, Ruby Cola, NP  rosuvastatin (CRESTOR) 20 MG tablet Take 1 tablet (20 mg total) by mouth daily. 07/08/21 07/08/22  Brimage, Seward Meth, DO  TRULICITY 0.75 MG/0.5ML SOPN Inject 0.75 mg into the skin once a week. Wednesday 06/01/21   [provider]  umeclidinium bromide (INCRUSE ELLIPTA) 62.5 MCG/ACT AEPB Inhale 1 puff into the lungs daily. 01/15/22   Leslye Peer, MD      Allergies    Patient has no known allergies.    Review of Systems   Review of Systems  Respiratory:  Positive for shortness of breath.     Physical Exam Updated Vital Signs BP (!) 127/93   Pulse (!) 104   Temp 98.4 F (36.9 C) (Oral)   Resp 18   Ht 6' (1.829 m)   Wt (!) 155 kg   SpO2 94%  BMI 46.34 kg/m  Physical Exam Vitals and nursing note reviewed.  HENT:     Head: Normocephalic and atraumatic.  Eyes:     Pupils: Pupils are equal, round, and reactive to light.  Cardiovascular:     Rate and Rhythm: Normal rate and regular rhythm.  Pulmonary:     Effort: Pulmonary effort is normal. No respiratory distress.     Breath sounds: Examination of the right-lower field reveals rales. Examination of the left-lower field reveals rales. Rales present.  Abdominal:     Palpations: Abdomen is soft.     Tenderness: There is no abdominal tenderness.  Musculoskeletal:     Comments: Trace bilateral lower extremity edema  Skin:    General: Skin is warm and dry.  Neurological:     Mental Status: He is alert.   Psychiatric:        Mood and Affect: Mood normal.     ED Results / Procedures / Treatments   Labs (all labs ordered are listed, but only abnormal results are displayed) Labs Reviewed  BASIC METABOLIC PANEL - Abnormal; Notable for the following components:      Result Value   Sodium 133 (*)    Chloride 97 (*)    Glucose, Bld 403 (*)    Creatinine, Ser 1.41 (*)    GFR, Estimated 53 (*)    All other components within normal limits  BASIC METABOLIC PANEL - Abnormal; Notable for the following components:   Glucose, Bld 437 (*)    BUN 24 (*)    Creatinine, Ser 1.36 (*)    GFR, Estimated 56 (*)    All other components within normal limits  RESP PANEL BY RT-PCR (RSV, FLU A&B, COVID)  RVPGX2  CBC  BRAIN NATRIURETIC PEPTIDE  TROPONIN I (HIGH SENSITIVITY)  TROPONIN I (HIGH SENSITIVITY)    EKG EKG Interpretation Date/Time:  Monday June 29 2023 14:45:04 EST Ventricular Rate:  112 PR Interval:  194 QRS Duration:  86 QT Interval:  322 QTC Calculation: 439 R Axis:   -65  Text Interpretation: Sinus tachycardia Left axis deviation Low voltage QRS Inferior infarct (cited on or before 01-Jul-2021) Cannot rule out Anteroseptal infarct , age undetermined Abnormal ECG When compared with ECG of 02-Jul-2021 00:12, Premature atrial complexes are no longer Present Minimal criteria for Anteroseptal infarct are now Present Confirmed by Estelle June [phone]) on 06/29/2023 9:46:55 PM  Radiology DG Chest 2 View  Result Date: 06/29/2023 CLINICAL DATA:  Cough, fever. EXAM: CHEST - 2 VIEW COMPARISON:  July 30, 2021. FINDINGS: The heart size and mediastinal contours are within normal limits. Both lungs are clear. The visualized skeletal structures are unremarkable. IMPRESSION: No active cardiopulmonary disease. Electronically Signed   By: Lupita Raider M.D.   On: 06/29/2023 17:31    Procedures Procedures    Medications Ordered in ED Medications  ipratropium-albuterol (DUONEB) 0.5-2.5 (3)  MG/3ML nebulizer solution 3 mL (3 mLs Nebulization Given 06/29/23 1605)  methylPREDNISolone sodium succinate (SOLU-MEDROL) 125 mg/2 mL injection 125 mg (125 mg Intravenous Given 06/29/23 1614)  sodium chloride 0.9 % bolus 1,000 mL ( Intravenous Stopped 06/29/23 2020)  sodium chloride 0.9 % bolus 1,000 mL (1,000 mLs Intravenous New Bag/Given 06/29/23 2205)    ED Course/ Medical Decision Making/ A&P Clinical Course as of 06/29/23 2218  Mon Jun 29, 2023  1907 Chest x-ray shows no focal consolidation or be concerning for pneumonia or overt pulmonary edema.  BNP is not elevated.  High sensitive troponin not consistent with  ACS.  No ischemic changes on EKG.  Elevated blood glucose of 403 low suspicion for HHS/DKA.  Creatinine r 0.41 up from baseline of 1.131-year ago.  Will provide IV fluids for rehydration and recheck creatinineRemainder of laboratory workup is unremarkable.  COVID/influenza/RSV is negative.  Patient reports symptomatic improvement after DuoNeb treatment and steroids here.   [MP]  2124 Repeat creatinine slightly downtrending at 1.36.  Informed patient of his wife of this finding and recommended admission for AKI and continued monitoring.  And is now amenable with plan for admission [MP]  2218 Discussed with admitting hospitalist who accepts patient for admission [MP]    Clinical Course User Index [MP] Royanne Foots, DO                                 Medical Decision Making 72 year old male with history as above presenting for cough and congestion for the last week.  1 day fevers.  Was seen earlier today at urgent care and directed here for further evaluation given concern for bilateral lower extremity edema and potential acute heart failure.  Chest x-ray taken earlier today at urgent care showed no focal consolidation.  Trace bilateral lower extremity edema.  Most likely etiology would be viral respiratory infection versus pneumonia.  Will obtain laboratory workup including CBC  metabolic panel.  Given underlying history of COPD will treat with DuoNeb treatment and 125 Solu-Medrol for potential COPD exacerbation.  Acute heart failure would be lower on the differential given lack of pulmonary edema from chest x-ray earlier today.  No chest pain.  Low suspicion for ACS.  Amount and/or Complexity of Data Reviewed Labs: ordered. Radiology: ordered.  Risk Prescription drug management. Decision regarding hospitalization.           Final Clinical Impression(s) / ED Diagnoses Final diagnoses:  Acute kidney injury (HCC)  COPD exacerbation (HCC)  Uncontrolled type 2 diabetes mellitus with hyperglycemia [facility])    Rx / DC Orders ED Discharge Orders     None         Royanne Foots, DO 06/29/23 2218

## 2023-06-29 NOTE — ED Triage Notes (Addendum)
Cough and congestion x1 week. Fever 5 days ago-non today. HX COPD. Has home oxygen as needed. Audibly congestion. Swelling in both feet. Negative triplex with [facility]- paper result provided by patient.

## 2023-06-29 NOTE — ED Notes (Signed)
Provider was advised of Pt's blood sugar level. I was advised by MD to give pt 15 units of Insulin.  Pt was given same. See MAR.

## 2023-06-29 NOTE — Progress Notes (Signed)
PEAK FLOW = 175

## 2023-06-30 DIAGNOSIS — T380X5A Adverse effect of glucocorticoids and synthetic analogues, initial encounter: Secondary | ICD-10-CM | POA: Diagnosis present

## 2023-06-30 DIAGNOSIS — J9601 Acute respiratory failure with hypoxia: Secondary | ICD-10-CM | POA: Diagnosis present

## 2023-06-30 DIAGNOSIS — B9789 Other viral agents as the cause of diseases classified elsewhere: Secondary | ICD-10-CM | POA: Diagnosis present

## 2023-06-30 DIAGNOSIS — Z79899 Other long term (current) drug therapy: Secondary | ICD-10-CM | POA: Diagnosis not present

## 2023-06-30 DIAGNOSIS — Y92239 Unspecified place in hospital as the place of occurrence of the external cause: Secondary | ICD-10-CM | POA: Diagnosis present

## 2023-06-30 DIAGNOSIS — Z801 Family history of malignant neoplasm of trachea, bronchus and lung: Secondary | ICD-10-CM | POA: Diagnosis not present

## 2023-06-30 DIAGNOSIS — Z794 Long term (current) use of insulin: Secondary | ICD-10-CM | POA: Diagnosis not present

## 2023-06-30 DIAGNOSIS — Z6841 Body Mass Index (BMI) 40.0 and over, adult: Secondary | ICD-10-CM | POA: Diagnosis not present

## 2023-06-30 DIAGNOSIS — N400 Enlarged prostate without lower urinary tract symptoms: Secondary | ICD-10-CM | POA: Diagnosis present

## 2023-06-30 DIAGNOSIS — N179 Acute kidney failure, unspecified: Secondary | ICD-10-CM | POA: Diagnosis present

## 2023-06-30 DIAGNOSIS — Z720 Tobacco use: Secondary | ICD-10-CM | POA: Diagnosis not present

## 2023-06-30 DIAGNOSIS — J439 Emphysema, unspecified: Secondary | ICD-10-CM | POA: Diagnosis present

## 2023-06-30 DIAGNOSIS — A493 Mycoplasma infection, unspecified site: Secondary | ICD-10-CM | POA: Diagnosis present

## 2023-06-30 DIAGNOSIS — I10 Essential (primary) hypertension: Secondary | ICD-10-CM | POA: Diagnosis present

## 2023-06-30 DIAGNOSIS — Z86718 Personal history of other venous thrombosis and embolism: Secondary | ICD-10-CM | POA: Diagnosis not present

## 2023-06-30 DIAGNOSIS — J441 Chronic obstructive pulmonary disease with (acute) exacerbation: Secondary | ICD-10-CM | POA: Diagnosis present

## 2023-06-30 DIAGNOSIS — G4733 Obstructive sleep apnea (adult) (pediatric): Secondary | ICD-10-CM | POA: Diagnosis present

## 2023-06-30 DIAGNOSIS — E1165 Type 2 diabetes mellitus with hyperglycemia: Secondary | ICD-10-CM | POA: Diagnosis present

## 2023-06-30 DIAGNOSIS — Z7982 Long term (current) use of aspirin: Secondary | ICD-10-CM | POA: Diagnosis not present

## 2023-06-30 DIAGNOSIS — E782 Mixed hyperlipidemia: Secondary | ICD-10-CM | POA: Diagnosis not present

## 2023-06-30 DIAGNOSIS — Z7984 Long term (current) use of oral hypoglycemic drugs: Secondary | ICD-10-CM | POA: Diagnosis not present

## 2023-06-30 DIAGNOSIS — K219 Gastro-esophageal reflux disease without esophagitis: Secondary | ICD-10-CM | POA: Diagnosis present

## 2023-06-30 DIAGNOSIS — E0810 Diabetes mellitus due to underlying condition with ketoacidosis without coma: Secondary | ICD-10-CM | POA: Diagnosis not present

## 2023-06-30 DIAGNOSIS — Z800 Family history of malignant neoplasm of digestive organs: Secondary | ICD-10-CM | POA: Diagnosis not present

## 2023-06-30 DIAGNOSIS — Z1152 Encounter for screening for COVID-19: Secondary | ICD-10-CM | POA: Diagnosis not present

## 2023-06-30 DIAGNOSIS — E785 Hyperlipidemia, unspecified: Secondary | ICD-10-CM | POA: Diagnosis present

## 2023-06-30 DIAGNOSIS — Z7985 Long-term (current) use of injectable non-insulin antidiabetic drugs: Secondary | ICD-10-CM | POA: Diagnosis not present

## 2023-06-30 LAB — RESPIRATORY PANEL BY PCR
Adenovirus: NOT DETECTED
Bordetella Parapertussis: NOT DETECTED
Bordetella pertussis: NOT DETECTED
Chlamydophila pneumoniae: NOT DETECTED
Coronavirus 229E: NOT DETECTED
Coronavirus HKU1: NOT DETECTED
Coronavirus NL63: NOT DETECTED
Coronavirus OC43: NOT DETECTED
Influenza A: NOT DETECTED
Influenza B: NOT DETECTED
Metapneumovirus: NOT DETECTED
Mycoplasma pneumoniae: DETECTED — AB
Parainfluenza Virus 1: NOT DETECTED
Parainfluenza Virus 2: NOT DETECTED
Parainfluenza Virus 3: NOT DETECTED
Parainfluenza Virus 4: NOT DETECTED
Respiratory Syncytial Virus: NOT DETECTED
Rhinovirus / Enterovirus: DETECTED — AB

## 2023-06-30 LAB — URINALYSIS, W/ REFLEX TO CULTURE (INFECTION SUSPECTED)
Bacteria, UA: NONE SEEN
Bilirubin Urine: NEGATIVE
Glucose, UA: >=500 mg/dL — AB
Hgb urine dipstick: NEGATIVE
Ketones, ur: 5 mg/dL — AB
Leukocytes,Ua: NEGATIVE
Nitrite: NEGATIVE
Protein, ur: 30 mg/dL — AB
Specific Gravity, Urine: 1.023 (ref 1.005–1.030)
pH: 5 (ref 5.0–8.0)

## 2023-06-30 LAB — GLUCOSE, CAPILLARY
Glucose-Capillary: 406 mg/dL — ABNORMAL HIGH (ref 70–99)
Glucose-Capillary: 350 mg/dL — ABNORMAL HIGH (ref 70–99)
Glucose-Capillary: 457 mg/dL — ABNORMAL HIGH (ref 70–99)
Glucose-Capillary: 202 mg/dL — ABNORMAL HIGH (ref 70–99)
Glucose-Capillary: 346 mg/dL — ABNORMAL HIGH (ref 70–99)
Glucose-Capillary: 336 mg/dL — ABNORMAL HIGH (ref 70–99)
Glucose-Capillary: 285 mg/dL — ABNORMAL HIGH (ref 70–99)

## 2023-06-30 LAB — CBC WITH DIFFERENTIAL/PLATELET
Abs Immature Granulocytes: 0.05 10*3/uL (ref 0.00–0.07)
Basophils Absolute: 0 10*3/uL (ref 0.0–0.1)
Basophils Relative: 1 %
Eosinophils Absolute: 0 10*3/uL (ref 0.0–0.5)
Eosinophils Relative: 0 %
HCT: 42.1 % (ref 39.0–52.0)
Hemoglobin: 13.5 g/dL (ref 13.0–17.0)
Immature Granulocytes: 1 %
Lymphocytes Relative: 18 %
Lymphs Abs: 1.2 10*3/uL (ref 0.7–4.0)
MCH: 27.9 pg (ref 26.0–34.0)
MCHC: 32.1 g/dL (ref 30.0–36.0)
MCV: 87 fL (ref 80.0–100.0)
Monocytes Absolute: 0.1 10*3/uL (ref 0.1–1.0)
Monocytes Relative: 2 %
Neutro Abs: 5.1 10*3/uL (ref 1.7–7.7)
Neutrophils Relative %: 78 %
Platelets: 165 10*3/uL (ref 150–400)
RBC: 4.84 MIL/uL (ref 4.22–5.81)
RDW: 13.9 % (ref 11.5–15.5)
WBC: 6.5 10*3/uL (ref 4.0–10.5)
nRBC: 0 % (ref 0.0–0.2)

## 2023-06-30 LAB — COMPREHENSIVE METABOLIC PANEL
ALT: 13 U/L (ref 0–44)
AST: 11 U/L — ABNORMAL LOW (ref 15–41)
Albumin: 3.5 g/dL (ref 3.5–5.0)
Alkaline Phosphatase: 67 U/L (ref 38–126)
Anion gap: 10 (ref 5–15)
BUN: 24 mg/dL — ABNORMAL HIGH (ref 8–23)
CO2: 24 mmol/L (ref 22–32)
Calcium: 8.8 mg/dL — ABNORMAL LOW (ref 8.9–10.3)
Chloride: 102 mmol/L (ref 98–111)
Creatinine, Ser: 1.39 mg/dL — ABNORMAL HIGH (ref 0.61–1.24)
GFR, Estimated: 54 mL/min — ABNORMAL LOW (ref >60)
Glucose, Bld: 304 mg/dL — ABNORMAL HIGH (ref 70–99)
Potassium: 4.4 mmol/L (ref 3.5–5.1)
Sodium: 136 mmol/L (ref 135–145)
Total Bilirubin: 0.6 mg/dL (ref <1.2)
Total Protein: 6.9 g/dL (ref 6.5–8.1)

## 2023-06-30 LAB — D-DIMER, QUANTITATIVE: D-Dimer, Quant: 0.48 ug{FEU}/mL (ref 0.00–0.50)

## 2023-06-30 LAB — HEMOGLOBIN A1C
Hgb A1c MFr Bld: 9.1 % — ABNORMAL HIGH (ref 4.8–5.6)
Mean Plasma Glucose: 214.47 mg/dL

## 2023-06-30 LAB — EXPECTORATED SPUTUM ASSESSMENT W GRAM STAIN, RFLX TO RESP C

## 2023-06-30 LAB — PHOSPHORUS: Phosphorus: 2.7 mg/dL (ref 2.5–4.6)

## 2023-06-30 LAB — MAGNESIUM: Magnesium: 2.1 mg/dL (ref 1.7–2.4)

## 2023-06-30 MED ORDER — INSULIN ASPART 100 UNIT/ML IJ SOLN
0.0000 [IU] | Freq: Every day | INTRAMUSCULAR | Status: DC
  Administered 2023-06-30: 3 [IU] via SUBCUTANEOUS

## 2023-06-30 MED ORDER — ONDANSETRON HCL 4 MG/2ML IJ SOLN: 4.0000 mg | Freq: Four times a day (QID) | INTRAMUSCULAR | Status: DC | PRN

## 2023-06-30 MED ORDER — INSULIN ASPART 100 UNIT/ML IJ SOLN
0.0000 [IU] | Freq: Three times a day (TID) | INTRAMUSCULAR | Status: DC
  Administered 2023-06-30: 20 [IU] via SUBCUTANEOUS
  Administered 2023-07-01 (×2): 11 [IU] via SUBCUTANEOUS
  Administered 2023-07-01: 4 [IU] via SUBCUTANEOUS
  Administered 2023-07-02: 2 [IU] via SUBCUTANEOUS

## 2023-06-30 MED ORDER — PNEUMOCOCCAL 20-VAL CONJ VACC 0.5 ML IM SUSY
0.5000 mL | PREFILLED_SYRINGE | INTRAMUSCULAR | Status: DC
  Filled 2023-06-30: qty 0.5

## 2023-06-30 MED ORDER — INSULIN ASPART 100 UNIT/ML IJ SOLN
0.0000 [IU] | Freq: Three times a day (TID) | INTRAMUSCULAR | Status: DC
  Administered 2023-06-30: 11 [IU] via SUBCUTANEOUS

## 2023-06-30 MED ORDER — INSULIN ASPART 100 UNIT/ML IJ SOLN
15.0000 [IU] | Freq: Once | INTRAMUSCULAR | Status: AC
  Administered 2023-06-30: 15 [IU] via SUBCUTANEOUS

## 2023-06-30 MED ORDER — AZITHROMYCIN 500 MG PO TABS
500.0000 mg | ORAL_TABLET | ORAL | Status: DC
Start: 2023-06-30 — End: 2023-07-03
  Administered 2023-06-30 – 2023-07-01 (×2): 500 mg via ORAL
  Filled 2023-06-30 (×2): qty 1

## 2023-06-30 MED ORDER — ACETAMINOPHEN 650 MG RE SUPP: 650.0000 mg | Freq: Four times a day (QID) | RECTAL | Status: DC | PRN

## 2023-06-30 MED ORDER — IPRATROPIUM-ALBUTEROL 0.5-2.5 (3) MG/3ML IN SOLN
3.0000 mL | Freq: Four times a day (QID) | RESPIRATORY_TRACT | Status: DC
  Administered 2023-06-30 (×4): 3 mL via RESPIRATORY_TRACT
  Filled 2023-06-30 (×4): qty 3

## 2023-06-30 MED ORDER — POLYETHYLENE GLYCOL 3350 17 G PO PACK
17.0000 g | PACK | Freq: Every day | ORAL | Status: DC
  Administered 2023-06-30 – 2023-07-02 (×2): 17 g via ORAL
  Filled 2023-06-30 (×2): qty 1

## 2023-06-30 MED ORDER — INSULIN GLARGINE-YFGN 100 UNIT/ML ~~LOC~~ SOLN
15.0000 [IU] | Freq: Two times a day (BID) | SUBCUTANEOUS | Status: DC
  Administered 2023-06-30 – 2023-07-01 (×2): 15 [IU] via SUBCUTANEOUS
  Filled 2023-06-30 (×3): qty 0.15

## 2023-06-30 MED ORDER — ASPIRIN 81 MG PO TBEC
81.0000 mg | DELAYED_RELEASE_TABLET | Freq: Every day | ORAL | Status: DC
  Administered 2023-06-30 – 2023-07-02 (×3): 81 mg via ORAL
  Filled 2023-06-30 (×3): qty 1

## 2023-06-30 MED ORDER — PANTOPRAZOLE SODIUM 40 MG PO TBEC
40.0000 mg | DELAYED_RELEASE_TABLET | Freq: Every day | ORAL | Status: DC
  Administered 2023-06-30 – 2023-07-02 (×3): 40 mg via ORAL
  Filled 2023-06-30 (×3): qty 1

## 2023-06-30 MED ORDER — PHENOL 1.4 % MT LIQD
1.0000 | OROMUCOSAL | Status: DC | PRN
  Administered 2023-06-30: 1 via OROMUCOSAL
  Filled 2023-06-30: qty 177

## 2023-06-30 MED ORDER — METHYLPREDNISOLONE SODIUM SUCC 125 MG IJ SOLR
80.0000 mg | Freq: Two times a day (BID) | INTRAMUSCULAR | Status: DC
  Administered 2023-06-30 – 2023-07-01 (×3): 80 mg via INTRAVENOUS
  Filled 2023-06-30 (×3): qty 2

## 2023-06-30 MED ORDER — GUAIFENESIN 100 MG/5ML PO LIQD
5.0000 mL | ORAL | Status: DC | PRN
  Administered 2023-06-30 (×2): 5 mL via ORAL
  Filled 2023-06-30 (×2): qty 5

## 2023-06-30 MED ORDER — INSULIN ASPART 100 UNIT/ML IJ SOLN
6.0000 [IU] | Freq: Three times a day (TID) | INTRAMUSCULAR | Status: DC
  Administered 2023-06-30 – 2023-07-01 (×2): 6 [IU] via SUBCUTANEOUS

## 2023-06-30 MED ORDER — ACETAMINOPHEN 325 MG PO TABS: 650.0000 mg | ORAL_TABLET | Freq: Four times a day (QID) | ORAL | Status: DC | PRN

## 2023-06-30 MED ORDER — IPRATROPIUM-ALBUTEROL 0.5-2.5 (3) MG/3ML IN SOLN
3.0000 mL | Freq: Three times a day (TID) | RESPIRATORY_TRACT | Status: DC
  Administered 2023-07-01 – 2023-07-02 (×4): 3 mL via RESPIRATORY_TRACT
  Filled 2023-06-30 (×4): qty 3

## 2023-06-30 MED ORDER — TAMSULOSIN HCL 0.4 MG PO CAPS
0.4000 mg | ORAL_CAPSULE | Freq: Every day | ORAL | Status: DC
  Administered 2023-06-30 – 2023-07-02 (×3): 0.4 mg via ORAL
  Filled 2023-06-30 (×3): qty 1

## 2023-06-30 MED ORDER — INSULIN GLARGINE-YFGN 100 UNIT/ML ~~LOC~~ SOLN
15.0000 [IU] | Freq: Every day | SUBCUTANEOUS | Status: DC
  Administered 2023-06-30: 15 [IU] via SUBCUTANEOUS
  Filled 2023-06-30: qty 0.15

## 2023-06-30 MED ORDER — NICOTINE POLACRILEX 2 MG MT GUM
2.0000 mg | CHEWING_GUM | OROMUCOSAL | Status: DC | PRN
Start: 2023-06-30 — End: 2023-06-30
  Filled 2023-06-30: qty 1

## 2023-06-30 MED ORDER — BENZONATATE 100 MG PO CAPS
200.0000 mg | ORAL_CAPSULE | Freq: Three times a day (TID) | ORAL | Status: DC | PRN
  Administered 2023-06-30 – 2023-07-02 (×2): 200 mg via ORAL
  Filled 2023-06-30 (×2): qty 2

## 2023-06-30 MED ORDER — MELATONIN 3 MG PO TABS: 3.0000 mg | ORAL_TABLET | Freq: Every evening | ORAL | Status: DC | PRN

## 2023-06-30 MED ORDER — SODIUM CHLORIDE 0.9 % IV SOLN: INTRAVENOUS | Status: DC

## 2023-06-30 MED ORDER — ROSUVASTATIN CALCIUM 20 MG PO TABS
20.0000 mg | ORAL_TABLET | Freq: Every day | ORAL | Status: DC
  Administered 2023-06-30 – 2023-07-02 (×3): 20 mg via ORAL
  Filled 2023-06-30 (×3): qty 1

## 2023-06-30 MED ORDER — INSULIN ASPART 100 UNIT/ML IJ SOLN
10.0000 [IU] | Freq: Once | INTRAMUSCULAR | Status: AC
  Administered 2023-06-30: 10 [IU] via SUBCUTANEOUS

## 2023-06-30 MED ORDER — ENOXAPARIN SODIUM 80 MG/0.8ML IJ SOSY
75.0000 mg | PREFILLED_SYRINGE | INTRAMUSCULAR | Status: DC
  Administered 2023-06-30 – 2023-07-02 (×3): 75 mg via SUBCUTANEOUS
  Filled 2023-06-30 (×3): qty 0.8

## 2023-06-30 NOTE — Progress Notes (Signed)
CBG is 350 now.

## 2023-06-30 NOTE — Progress Notes (Addendum)
(  Carryover admission to the Day Admitter; accepted by Dr.  Joneen Roach as transfer from  [facility]  to a  med-surg bed at  [facility]  for acute COPD exacerbation. Please see Dr.  Lajoyce Lauber transfer documentation for additional details).  Briefly, this is a 72 year old male with history of COPD, type 2 diabetes mellitus, who is being admitted with acute COPD exacerbation as well as hyperglycemia.  Since arriving at [facility], he has been having blood sugars in the 400s to 500 range.  He received NovoLog 15 units subcu x 1 prior to transfer from [facility], and I was notified that, upon arrival at [facility], that his updated CBG is 437.  For my brief review of his labs, BMP shows no evidence of anion gap metabolic acidosis.  He was noted to receive solumedrol at [facility] for his COPD exacerbation.  I have placed some additional preliminary admit orders via the adult multi-morbid admission order set.  I have continued existing order for prn duo nebulizer treatments, and added Solu-Medrol 80 mg IV twice daily as well as scheduled duo nebulizer treatments.  Have ordered serum magnesium and phosphorus levels with the morning labs.  Regarding his hyperglycemia in the setting of a known history of type 2 diabetes mellitus, continued existing orders for Accu-Cheks on a before every meal/at bedtime basis along with existing orders for associated sliding scale insulin and A1c check.  Of also ordered an additional 15 units of subcutaneous NovoLog x 1 dose now, with repeat CBG to be checked in 1 hour.   In addition to serum magnesium and phosphorus levels, I have also ordered additional morning labs in the form of CMP and CBC.    Newton Pigg, DO Hospitalist

## 2023-06-30 NOTE — Evaluation (Signed)
Physical Therapy Evaluation and Discharge Patient Details Name: Manuel Leonard MRN: [id number] DOB: 13-Apr-1951 Today's Date: 06/30/2023  History of Present Illness  72 y.o. male presents to ED 06/29/23 for shortness of breath and coughing and bil LE edema. COPD exacerbation, AKI, hyperglycemia,  PMH COPD, DM2, DVT (not on anticoagulation), HTN, umbilical hernia, OSA, tobacco use disorder.  Clinical Impression   Patient evaluated by Physical Therapy with no further acute PT needs identified. All education has been completed and the patient has no further questions. Patient currently requiring 3L O2 when walking, but has no balance issues.  PT is signing off. Thank you for this referral.         If plan is discharge home, recommend the following:     Can travel by private vehicle        Equipment Recommendations None recommended by PT  Recommendations for Other Services       Functional Status Assessment Patient has not had a recent decline in their functional status     Precautions / Restrictions Precautions Precautions: None      Mobility  Bed Mobility               General bed mobility comments: sitting EOB on arrival    Transfers Overall transfer level: Independent Equipment used: None                    Ambulation/Gait Ambulation/Gait assistance: Independent Gait Distance (Feet): 200 Feet Assistive device: None Gait Pattern/deviations: WFL(Within Functional Limits)   Gait velocity interpretation: 1.31 - 2.62 ft/sec, indicative of limited community ambulator   General Gait Details: on 3L with sats 89% lowest  Stairs            Wheelchair Mobility     Tilt Bed    Modified Rankin (Stroke Patients Only)       Balance Overall balance assessment: Independent                                           Pertinent Vitals/Pain Pain Assessment Pain Assessment: No/denies pain    Home Living Family/patient  expects to be discharged to:: Private residence Living Arrangements: Spouse/significant other Available Help at Discharge: Family;Available 24 hours/day Type of Home: House Home Access: Stairs to enter Entrance Stairs-Rails: Right Entrance Stairs-Number of Steps: 2   Home Layout: Two level;Able to live on main level with bedroom/bathroom Home Equipment: None      Prior Function Prior Level of Function : Independent/Modified Independent               ADLs Comments: sits to shower     Extremity/Trunk Assessment   Upper Extremity Assessment Upper Extremity Assessment: Overall WFL for tasks assessed    Lower Extremity Assessment Lower Extremity Assessment: Overall WFL for tasks assessed    Cervical / Trunk Assessment Cervical / Trunk Assessment: Other exceptions Cervical / Trunk Exceptions: overweight  Communication   Communication Communication: No apparent difficulties Cueing Techniques: Verbal cues  Cognition Arousal: Alert Behavior During Therapy: WFL for tasks assessed/performed Overall Cognitive Status: Within Functional Limits for tasks assessed                                 General Comments: has questions re: his DM and drinking juice; he does have questions re: his lung  function        General Comments General comments (skin integrity, edema, etc.): Wife present    Exercises     Assessment/Plan    PT Assessment Patient does not need any further PT services  PT Problem List         PT Treatment Interventions      PT Goals (Current goals can be found in the Care Plan section)  Acute Rehab PT Goals PT Goal Formulation: All assessment and education complete, DC therapy    Frequency       Co-evaluation               AM-PAC PT "6 Clicks" Mobility  Outcome Measure Help needed turning from your back to your side while in a flat bed without using bedrails?: None Help needed moving from lying on your back to sitting on the  side of a flat bed without using bedrails?: None Help needed moving to and from a bed to a chair (including a wheelchair)?: None Help needed standing up from a chair using your arms (e.g., wheelchair or bedside chair)?: None Help needed to walk in hospital room?: None Help needed climbing 3-5 steps with a railing? : None 6 Click Score: 24    End of Session Equipment Utilized During Treatment: Oxygen Activity Tolerance: Patient tolerated treatment well Patient left: in bed;with call bell/phone within reach;with nursing/sitter in room;with family/visitor present Nurse Communication: Mobility status PT Visit Diagnosis: Other abnormalities of gait and mobility (R26.89)    Time: 8295-6213 PT Time Calculation (min) (ACUTE ONLY): 14 min   Charges:   PT Evaluation $PT Eval Low Complexity: 1 Low   PT General Charges $$ ACUTE PT VISIT: 1 Visit          Jerolyn Center, PT Acute Rehabilitation Services  Office [phone]   Zena Amos 06/30/2023, 1:29 PM

## 2023-06-30 NOTE — Progress Notes (Signed)
Patient declined CPAP. No unit in room at this time. Told patient one would be provided for him if he decided to wear one.

## 2023-06-30 NOTE — Progress Notes (Signed)
CBG rechecked was 406. Notified Dr. Arlean Hopping  with a new order for 10 units of insulin SQ and to report if recheck in an hour is greater than 400.

## 2023-06-30 NOTE — Progress Notes (Signed)
Mobility Specialist Progress Note;   06/30/23 1435  Mobility  Activity Ambulated independently in hallway  Level of Assistance Standby assist, set-up cues, supervision of patient - no hands on  Assistive Device None  Distance Ambulated (ft) 175 ft  Activity Response Tolerated well  Mobility Referral Yes  Mobility Specialist Start Time (ACUTE ONLY) 1435  Mobility Specialist Stop Time (ACUTE ONLY) 1450  Mobility Specialist Time Calculation (min) (ACUTE ONLY) 15 min    Pre-mobility: SPO2 96% 3LO2 During-mobility: SPO2 88-94% 3LO2 Post-mobility: SPO2 97% 3LO2  Pt agreeable to mobility with encouragement. Required no physical assistance during ambulation, SV. Ambulated on 3LO2, VSS. Asx throughout session. C/o fatigue at EOS from not being able to sleep well last night. Pt left on 3LO2 sitting on EOB with all needs met. Wife in room.  Caesar Bookman Mobility Specialist Please contact via SecureChat or Rehab Office [phone]

## 2023-06-30 NOTE — Progress Notes (Signed)
No tele order for patient received. Per Roney Marion, NP no need for tele. Will discontinue

## 2023-06-30 NOTE — Progress Notes (Signed)
   06/30/23 1533  TOC Brief Assessment  Insurance and Status Reviewed (Medicare A and B)  Patient has primary care physician Yes (Masneri, Wille Celeste, DO)  Home environment has been reviewed From home with Wife  Prior level of function: independent   Does use O2 PRN  Prior/Current Home Services No current home services   OT to Evaluate and treat  PT has signed off  Do not anticipate any TOC needs

## 2023-06-30 NOTE — Plan of Care (Signed)
  Problem: Education: Goal: Knowledge of General Education information will improve Description: Including pain rating scale, medication(s)/side effects and non-pharmacologic comfort measures Outcome: Progressing   Problem: Clinical Measurements: Goal: Ability to maintain clinical measurements within normal limits will improve Outcome: Progressing Goal: Diagnostic test results will improve Outcome: Progressing Goal: Cardiovascular complication will be avoided Outcome: Progressing   Problem: Activity: Goal: Risk for activity intolerance will decrease Outcome: Progressing   Problem: Elimination: Goal: Will not experience complications related to urinary retention Outcome: Progressing   Problem: Pain Management: Goal: General experience of comfort will improve Outcome: Progressing   Problem: Skin Integrity: Goal: Risk for impaired skin integrity will decrease Outcome: Progressing   Problem: Clinical Measurements: Goal: Respiratory complications will improve Outcome: Not Progressing

## 2023-06-30 NOTE — Progress Notes (Signed)
The patient is admitted from [facility] to 5 W room 11. A & O x 4.. The patient and his wife are oriented to his room, call bell/ascom and staff. Full assessment to epic completed. Admission CBG was 457 mg/dL. Notified Dr. Arlean Hopping and received order for insulin as seen in the [facility]. Order implemented and will recheck CBG in an hour.

## 2023-06-30 NOTE — Progress Notes (Signed)
SATURATION QUALIFICATIONS: (This note is used to comply with regulatory documentation for home oxygen)  Patient Saturations on Room Air at Rest = 86%  Patient Saturations on Room Air while Ambulating = not appropriate  Patient Saturations on 3 Liters of oxygen while Ambulating = 89%  Please briefly explain why patient needs home oxygen:  To maintain oxygen saturation >87% during functional mobility.    Jerolyn Center, PT Acute Rehabilitation Services  Office [phone]

## 2023-06-30 NOTE — H&P (Addendum)
History and Physical    Patient: Manuel Leonard ZOX:[id number] DOB: [birth year] DOA: 06/29/2023 DOS: the patient was seen and examined on 06/30/2023 PCP: Koren Shiver, DO  Patient coming from: Home  Chief Complaint:  Chief Complaint  Patient presents with   Shortness of Breath   HPI: Manuel Leonard is a 72 y.o. male with medical history significant of COPD, DVT not on home anticoagulation, hypertension, hyperlipidemia, type 2 diabetes mellitus, umbilical hernia, OSA, GERD, and prior hx of tobacco use disorder. He initially presented to urgent care for evaluation of shortness of breath and coughing that have been ongoing for 1 week. He was directed to come to the ED by urgent care. Patient reports the cough is productive with white sputum and he also noted an increase in bilateral lower extremity edema. No known sick contacts. He reports that he had a fever of 38C one day last week without recurrence. He denies chills, malaise, chest pain, palpitations, nausea, or vomiting.   ED Course: On arrival to [facility] ED patient was noted to be afebrile temp 36.9C, BP 120/78, HR 112, RR 24, SpO2 90% on room air.  Labs notable for sodium 133, chloride 97, glucose 403, creatinine elevated at 1.41 with a recent baseline around 1-1.2, troponin negative x 2, BNP 34.7.  He is COVID, Influenza A and B, and RSV negative.  Urinalysis without signs of UTI, glucose >500 and positive for Ketones and protein. Hgb A1C 9.1%.  Blood cultures collected and in process. He was given solumedrol, duonebs, 2L NS bolus, and 40U of novolog. [facility] contacted for admission.  Review of Systems: As mentioned in the history of present illness. All other systems reviewed and are negative. Past Medical History:  Diagnosis Date   Abnormal EKG 12/03/2017   Acute bronchitis 12/29/2016   Adenomatous polyps    Chest discomfort 12/03/2017   COPD (chronic obstructive pulmonary disease) (HCC)    Diabetes mellitus (HCC) 12/03/2017    Emphysema/COPD (HCC)    GERD (gastroesophageal reflux disease)    Hyperlipidemia 12/21/2015   Hypertension    not on meds    Leukocytosis 09/27/2014   Multiple pulmonary nodules    Pneumonia    hx of 2017    Sepsis (HCC) 09/27/2014   Tobacco abuse    Umbilical hernia 10/20/2016   Past Surgical History:  Procedure Laterality Date   APPENDECTOMY     HEMORRHOIDECTOMY WITH HEMORRHOID BANDING     HERNIA REPAIR     bilateral    INSERTION OF MESH N/A 10/21/2016   Procedure: INSERTION OF MESH PATCH;  Surgeon: Darnell Level, MD;  Location: [facility] ORS;  Service: General;  Laterality: N/A;   right index finger surgery - partial amputation      TONSILLECTOMY     UMBILICAL HERNIA REPAIR N/A 10/21/2016   Procedure: UMBILICAL HERNIA REPAIR;  Surgeon: Darnell Level, MD;  Location: [facility];  Service: General;  Laterality: N/A;   Social History:  reports that he quit smoking about 2 years ago. His smoking use included cigarettes. He started smoking about 50 years ago. He has a 96 pack-year smoking history. He has been exposed to tobacco smoke. He has never used smokeless tobacco. He reports current alcohol use. He reports that he does not use drugs.  No Known Allergies  Family History  Problem Relation Age of Onset   Lung cancer Brother    Stomach cancer Brother    Cancer Father     Prior to Admission medications   Medication Sig  Start Date End Date Taking? Authorizing Provider  albuterol (VENTOLIN HFA) 108 (90 Base) MCG/ACT inhaler Inhale 2 puffs into the lungs every 6 (six) hours as needed for wheezing or shortness of breath. 07/30/21   Cobb, Ruby Cola, NP  albuterol (VENTOLIN HFA) 108 (90 Base) MCG/ACT inhaler Inhale 2 puffs into the lungs every 6 (six) hours as needed for wheezing or shortness of breath. 12/06/21   Leslye Peer, MD  aspirin 81 MG EC tablet Take 1 tablet (81 mg total) by mouth daily. 01/22/18   Baldo Daub, MD  buPROPion (WELLBUTRIN SR) 150 MG 12 hr tablet TAKE 1 TABLET(150 MG) BY  MOUTH TWICE DAILY 10/18/21   Cobb, Ruby Cola, NP  ibuprofen (ADVIL) 200 MG tablet Take 600 mg by mouth every 6 (six) hours as needed for mild pain.    [provider]  ipratropium-albuterol (DUONEB) 0.5-2.5 (3) MG/3ML SOLN Take 3 mLs by nebulization every 6 (six) hours as needed (for shortness of breath or wheezing). 07/30/21   Cobb, Ruby Cola, NP  metFORMIN (GLUCOPHAGE) 500 MG tablet Take 2 (two) times daily with a meal by mouth.    [provider]  metoprolol tartrate (LOPRESSOR) 100 MG tablet Take 1 tablet (100 mg total) by mouth once for 1 dose. Please take 2 hours before your CT. 12/23/21 12/23/21  Baldo Daub, MD  NEOMYCIN-POLYMYXIN-HYDROCORTISONE (CORTISPORIN) 1 % SOLN OTIC solution Apply 1-2 drops to toe BID after soaking 07/15/22   Hyatt, Max T, DPM  omeprazole (PRILOSEC) 20 MG capsule Take 1 capsule (20 mg total) by mouth 2 (two) times daily before a meal. 07/30/21 07/30/22  Cobb, Ruby Cola, NP  ondansetron (ZOFRAN) 4 MG tablet Take 1 tablet (4 mg total) by mouth every 8 (eight) hours as needed for nausea or vomiting. 07/30/21   Cobb, Ruby Cola, NP  rosuvastatin (CRESTOR) 20 MG tablet Take 1 tablet (20 mg total) by mouth daily. 07/08/21 07/08/22  Brimage, Seward Meth, DO  TRULICITY 0.75 MG/0.5ML SOPN Inject 0.75 mg into the skin once a week. Wednesday 06/01/21   [provider]  umeclidinium bromide (INCRUSE ELLIPTA) 62.5 MCG/ACT AEPB Inhale 1 puff into the lungs daily. 01/15/22   Leslye Peer, MD    Physical Exam: Vitals:   06/30/23 0345 06/30/23 0400 06/30/23 0500 06/30/23 0600  BP: 105/71     Pulse: 91 89 83 90  Resp: (!) 30 19 18    Temp:      TempSrc:      SpO2: 92% 96% 96% 93%  Weight:      Height:        Constitutional: NAD, calm, comfortable Eyes: PERRL, lids and conjunctivae normal ENMT: Mucous membranes are moist. Posterior pharynx clear of any exudate or lesions. Neck: normal, supple, no masses, no thyromegaly Respiratory: Rales in bilateral  bases. Normal respiratory effort. No accessory muscle use. On 3L O2 via nasal cannula Cardiovascular: Regular rate and rhythm, no murmurs / rubs / gallops. Trace bilateral lower extremity edema. 2+ radial and pedal pulses. No carotid bruits.  Abdomen: no tenderness, no masses palpated. No hepatosplenomegaly. Bowel sounds positive.  Musculoskeletal: no clubbing / cyanosis. No joint deformity upper and lower extremities. Good ROM, no contractures. Normal muscle tone.  Skin: no rashes, lesions, ulcers. Warm and dry Neurologic: Alert and oriented x 3.   Data Reviewed: CBC    Component Value Date/Time   WBC 6.5 06/30/2023 0428   RBC 4.84 06/30/2023 0428   HGB 13.5 06/30/2023 0428   HCT  42.1 06/30/2023 0428   PLT 165 06/30/2023 0428   MCV 87.0 06/30/2023 0428   MCH 27.9 06/30/2023 0428   MCHC 32.1 06/30/2023 0428   RDW 13.9 06/30/2023 0428   LYMPHSABS 1.2 06/30/2023 0428   MONOABS 0.1 06/30/2023 0428   EOSABS 0.0 06/30/2023 0428   BASOSABS 0.0 06/30/2023 0428      Latest Ref Rng & Units 06/30/2023    4:28 AM 06/29/2023    8:36 PM 06/29/2023    2:41 PM  BMP  Glucose 70 - 99 mg/dL 119  147  829   BUN 8 - 23 mg/dL 24  24  21    Creatinine 0.61 - 1.24 mg/dL 5.62  1.30  8.65   Sodium 135 - 145 mmol/L 136  136  133   Potassium 3.5 - 5.1 mmol/L 4.4  4.9  5.1   Chloride 98 - 111 mmol/L 102  99  97   CO2 22 - 32 mmol/L 24  27  28    Calcium 8.9 - 10.3 mg/dL 8.8  9.8  9.7    BNP    Component Value Date/Time   BNP 34.7 06/29/2023 1512   Cardiac Panel (last 3 results) Recent Labs    06/29/23 1441 06/29/23 1713  TROPONINIHS 6 6   Lab Results  Component Value Date   HGBA1C 9.1 (H) 06/30/2023   HGBA1C 6.7 (H) 06/30/2021   Magnesium    Component Value Date/Time   MAGNESIUM 2.1 06/30/2023 0428   Phosphorous    Component Value Date/Time   PHOSPHOROUS 2.7 06/30/2023 0428   Urinalysis    Component Value Date/Time   COLORURINE YELLOW 06/30/2023 0020   APPEARANCEUR CLEAR  06/30/2023 0020   LABSPEC 1.023 06/30/2023 0020   PHURINE 5.0 06/30/2023 0020   GLUCOSEU >=500 (A) 06/30/2023 0020   HGBUR NEGATIVE 06/30/2023 0020   BILIRUBINUR NEGATIVE 06/30/2023 0020   KETONESUR 5 (A) 06/30/2023 0020   PROTEINUR 30 (A) 06/30/2023 0020   NITRITE NEGATIVE 06/30/2023 0020   LEUKOCYTESUR NEGATIVE 06/30/2023 0020    Results for orders placed or performed during the hospital encounter of 06/29/23  Resp panel by RT-PCR (RSV, Flu A&B, Covid) Anterior Nasal Swab     Status: None   Collection Time: 06/29/23  3:58 PM   Specimen: Anterior Nasal Swab  Result Value Ref Range Status   SARS Coronavirus 2 by RT PCR NEGATIVE NEGATIVE Final    Comment: (NOTE) SARS-CoV-2 target nucleic acids are NOT DETECTED.  The SARS-CoV-2 RNA is generally detectable in upper respiratory specimens during the acute phase of infection. The lowest concentration of SARS-CoV-2 viral copies this assay can detect is 138 copies/mL. A negative result does not preclude SARS-Cov-2 infection and should not be used as the sole basis for treatment or other patient management decisions. A negative result may occur with  improper specimen collection/handling, submission of specimen other than nasopharyngeal swab, presence of viral mutation(s) within the areas targeted by this assay, and inadequate number of viral copies(<138 copies/mL). A negative result must be combined with clinical observations, patient history, and epidemiological information. The expected result is Negative.  Fact Sheet for Patients:  BloggerCourse.com  Fact Sheet for Healthcare Providers:  SeriousBroker.it  This test is no t yet approved or cleared by the [place] FDA and  has been authorized for detection and/or diagnosis of SARS-CoV-2 by FDA under an Emergency Use Authorization (EUA). This EUA will remain  in effect (meaning this test can be used) for the duration of  the  COVID-19 declaration under Section 564(b)(1) of the Act, 21 U.S.C.section 360bbb-3(b)(1), unless the authorization is terminated  or revoked sooner.       Influenza A by PCR NEGATIVE NEGATIVE Final   Influenza B by PCR NEGATIVE NEGATIVE Final    Comment: (NOTE) The Xpert Xpress SARS-CoV-2/FLU/RSV plus assay is intended as an aid in the diagnosis of influenza from Nasopharyngeal swab specimens and should not be used as a sole basis for treatment. Nasal washings and aspirates are unacceptable for Xpert Xpress SARS-CoV-2/FLU/RSV testing.  Fact Sheet for Patients: BloggerCourse.com  Fact Sheet for Healthcare Providers: SeriousBroker.it  This test is not yet approved or cleared by the [place] FDA and has been authorized for detection and/or diagnosis of SARS-CoV-2 by FDA under an Emergency Use Authorization (EUA). This EUA will remain in effect (meaning this test can be used) for the duration of the COVID-19 declaration under Section 564(b)(1) of the Act, 21 U.S.C. section 360bbb-3(b)(1), unless the authorization is terminated or revoked.     Resp Syncytial Virus by PCR NEGATIVE NEGATIVE Final    Comment: (NOTE) Fact Sheet for Patients: BloggerCourse.com  Fact Sheet for Healthcare Providers: SeriousBroker.it  This test is not yet approved or cleared by the [place] FDA and has been authorized for detection and/or diagnosis of SARS-CoV-2 by FDA under an Emergency Use Authorization (EUA). This EUA will remain in effect (meaning this test can be used) for the duration of the COVID-19 declaration under Section 564(b)(1) of the Act, 21 U.S.C. section 360bbb-3(b)(1), unless the authorization is terminated or revoked.  Performed at [facility], [address], [place], [place] [address]    DG Chest 2 View  Result Date: 06/29/2023 CLINICAL DATA:   Cough, fever. EXAM: CHEST - 2 VIEW COMPARISON:  July 30, 2021. FINDINGS: The heart size and mediastinal contours are within normal limits. Both lungs are clear. The visualized skeletal structures are unremarkable. IMPRESSION: No active cardiopulmonary disease. Electronically Signed   By: Lupita Raider M.D.   On: 06/29/2023 17:31      Assessment and Plan: #COPD Exacerbation - IV Solumedrol - Duonebs - Add on Sputum culture and 20 Pathogen panel - Mucinex - Supplemental O2 as needed - Pulmonary toilet  #Acute Kidney Injury Creatinine 1.39 on AM BMP (Recent Baseline 1.0-1.2 ) - NS at 100 mL/hr for 10 hours, encourage PO hydration - Avoid nephrotoxins, contrast Dyes, Hypotension and Dehydration  - Repeat BMP with AM labs  #History of DVT Negative Homan's sign bilaterally on exam -Check d-dimer  #Type II Diabetes Mellitus Blood glucose elevated, IV steroids contributory. A1C 9.1%. Not on basal insulin at home - Start 15U Semglee daily, titrate as needed - ACHS CBG and SSI  #Hyperlipidemia - Continue home rosuvastatin  #GERD -Protonix   VTE prophylaxis: Lovenox GI prophylaxis: Protonix, GERD hx Diet: Carb Modified Access: PIV Lines: NONE Code Status: FULL Telemetry: No Disposition: Admit to Med-Surg  Advance Care Planning:   Code Status: Full Code   Consults: None  Family Communication: Wife at bedside  Severity of Illness: The appropriate patient status for this patient is INPATIENT. Inpatient status is judged to be reasonable and necessary in order to provide the required intensity of service to ensure the patient's safety. The patient's presenting symptoms, physical exam findings, and initial radiographic and laboratory data in the context of their chronic comorbidities is felt to place them at high risk for further clinical deterioration. Furthermore, it is not anticipated that the patient will be medically stable  for discharge from the hospital within 2  midnights of admission.   * I certify that at the point of admission it is my clinical judgment that the patient will require inpatient hospital care spanning beyond 2 midnights from the point of admission due to high intensity of service, high risk for further deterioration and high frequency of surveillance required.*  To reach the provider On-Call:   7AM- 7PM see care teams to locate the attending and reach out to them via [email or web address] Password: [email or web address] 7PM-7AM contact night-coverage If you still have difficulty reaching the appropriate provider, please page the [facility] (Director on Call) for [facility] on amion for assistance  This document was prepared using Conservation officer, historic buildings and may include unintentional dictation errors.  Bishop Limbo FNP-BC, PMHNP-BC Nurse Practitioner [facility]

## 2023-07-01 DIAGNOSIS — J441 Chronic obstructive pulmonary disease with (acute) exacerbation: Secondary | ICD-10-CM | POA: Diagnosis not present

## 2023-07-01 DIAGNOSIS — N179 Acute kidney failure, unspecified: Secondary | ICD-10-CM | POA: Diagnosis not present

## 2023-07-01 DIAGNOSIS — E782 Mixed hyperlipidemia: Secondary | ICD-10-CM | POA: Diagnosis not present

## 2023-07-01 DIAGNOSIS — Z720 Tobacco use: Secondary | ICD-10-CM | POA: Diagnosis not present

## 2023-07-01 LAB — BASIC METABOLIC PANEL
Anion gap: 8 (ref 5–15)
BUN: 35 mg/dL — ABNORMAL HIGH (ref 8–23)
CO2: 27 mmol/L (ref 22–32)
Calcium: 8.6 mg/dL — ABNORMAL LOW (ref 8.9–10.3)
Chloride: 104 mmol/L (ref 98–111)
Creatinine, Ser: 1.27 mg/dL — ABNORMAL HIGH (ref 0.61–1.24)
GFR, Estimated: >60 mL/min (ref >60)
Glucose, Bld: 200 mg/dL — ABNORMAL HIGH (ref 70–99)
Potassium: 4.5 mmol/L (ref 3.5–5.1)
Sodium: 139 mmol/L (ref 135–145)

## 2023-07-01 LAB — GLUCOSE, CAPILLARY
Glucose-Capillary: 268 mg/dL — ABNORMAL HIGH (ref 70–99)
Glucose-Capillary: 268 mg/dL — ABNORMAL HIGH (ref 70–99)
Glucose-Capillary: 192 mg/dL — ABNORMAL HIGH (ref 70–99)
Glucose-Capillary: 188 mg/dL — ABNORMAL HIGH (ref 70–99)

## 2023-07-01 LAB — CBC
HCT: 40.1 % (ref 39.0–52.0)
Hemoglobin: 12.7 g/dL — ABNORMAL LOW (ref 13.0–17.0)
MCH: 28.3 pg (ref 26.0–34.0)
MCHC: 31.7 g/dL (ref 30.0–36.0)
MCV: 89.3 fL (ref 80.0–100.0)
Platelets: 194 10*3/uL (ref 150–400)
RBC: 4.49 MIL/uL (ref 4.22–5.81)
RDW: 13.8 % (ref 11.5–15.5)
WBC: 11.5 10*3/uL — ABNORMAL HIGH (ref 4.0–10.5)
nRBC: 0 % (ref 0.0–0.2)

## 2023-07-01 LAB — MAGNESIUM: Magnesium: 2.2 mg/dL (ref 1.7–2.4)

## 2023-07-01 MED ORDER — GUAIFENESIN ER 600 MG PO TB12
600.0000 mg | ORAL_TABLET | Freq: Two times a day (BID) | ORAL | Status: DC
  Administered 2023-07-01 – 2023-07-02 (×3): 600 mg via ORAL
  Filled 2023-07-01 (×3): qty 1

## 2023-07-01 MED ORDER — INSULIN GLARGINE-YFGN 100 UNIT/ML ~~LOC~~ SOLN
20.0000 [IU] | Freq: Two times a day (BID) | SUBCUTANEOUS | Status: DC
  Administered 2023-07-01: 20 [IU] via SUBCUTANEOUS
  Filled 2023-07-01 (×3): qty 0.2

## 2023-07-01 MED ORDER — BISACODYL 10 MG RE SUPP
10.0000 mg | Freq: Every day | RECTAL | Status: DC | PRN
  Filled 2023-07-01: qty 1

## 2023-07-01 MED ORDER — BISACODYL 5 MG PO TBEC: 5.0000 mg | DELAYED_RELEASE_TABLET | Freq: Once | ORAL | Status: DC

## 2023-07-01 MED ORDER — METHYLPREDNISOLONE SODIUM SUCC 40 MG IJ SOLR
40.0000 mg | Freq: Two times a day (BID) | INTRAMUSCULAR | Status: DC
  Administered 2023-07-01 – 2023-07-02 (×2): 40 mg via INTRAVENOUS
  Filled 2023-07-01 (×2): qty 1

## 2023-07-01 MED ORDER — INSULIN ASPART 100 UNIT/ML IJ SOLN
8.0000 [IU] | Freq: Three times a day (TID) | INTRAMUSCULAR | Status: DC
Start: 2023-07-01 — End: 2023-07-02
  Administered 2023-07-01 – 2023-07-02 (×3): 8 [IU] via SUBCUTANEOUS

## 2023-07-01 NOTE — Progress Notes (Signed)
Mobility Specialist Progress Note;   07/01/23 1045  Mobility  Activity Ambulated independently in hallway  Level of Assistance Standby assist, set-up cues, supervision of patient - no hands on  Assistive Device None  Distance Ambulated (ft) 350 ft  Activity Response Tolerated well  Mobility Referral Yes  $Mobility charge 1 Mobility  Mobility Specialist Start Time (ACUTE ONLY) 1045  Mobility Specialist Stop Time (ACUTE ONLY) 1105  Mobility Specialist Time Calculation (min) (ACUTE ONLY) 20 min    Pre-mobility: SPO2 95% 3LO2 During-mobility: SPO2 92-95% 3LO2 Post-mobility: SPO2 95% 3LO2  Pt eager for mobility. Required no physical assistance during ambulation, SV. Pt wanted to try ambulating on 2LO2 as that is baseline per pt. Took 2x standing rest break d/t SOB and fatigue. VSS throughout, however increased to 3LO2 during session d/t SOB. Encouraged pursed lip breathing. Pt back in room on EOB, left on 3LO2. Wife in room.  Manuel Leonard Mobility Specialist Please contact via SecureChat or Rehab Office [phone]

## 2023-07-01 NOTE — TOC Progression Note (Signed)
Transition of Care [facility]) - Progression Note    Patient Details  Name: Manuel Leonard MRN: [id number] Date of Birth: 16-Dec-1950  Transition of Care [facility]) CM/SW Contact  Gordy Clement, RN Phone Number: 07/01/2023, 12:50 PM  Clinical Narrative:     Per progression. Anticipate dc to [facility] Thursday  Patient O2 2L is baseline  Wife to bring portable and transport home . TOC will continue to follow patient for any additional discharge needs          Expected Discharge Plan and Services                                               Social Determinants of Health (SDOH) Interventions SDOH Screenings   Food Insecurity: No Food Insecurity (06/30/2023)  Housing: Patient Declined (06/30/2023)  Transportation Needs: No Transportation Needs (06/30/2023)  Utilities: Not At Risk (06/30/2023)  Tobacco Use: Medium Risk (06/29/2023)    Readmission Risk Interventions     No data to display

## 2023-07-01 NOTE — Progress Notes (Signed)
PROGRESS NOTE        PATIENT DETAILS Name: Manuel Leonard Age: 72 y.o. Sex: male Date of Birth: 24-Aug-1951 Admit Date: 06/29/2023 Admitting Physician Gery Pray, MD [id number], Wille Celeste, DO  Brief Summary: Patient is a 72 y.o.  male with history of COPD, DM-2, HTN, HLD, OSA, GERD-presented with several days history of cough/cold/chest congestion-found to have COPD exacerbation in the setting of rhinovirus/mycoplasma infection.  Significant events: 11/4>> admit to [facility]  Significant studies: 11/4>> CXR: No PNA  Significant microbiology data: 11/4>> COVID/influenza/RSV PCR: Negative 11/5>> respiratory virus panel:+ve for rhinovirus/mycoplasma 11/5>> sputum culture: Pending 11/5>> blood culture: No growth  Procedures: None  Consults: None  Subjective: Feels better-less cough-breathing is much better.  Objective: Vitals: Blood pressure (!) 105/90, pulse 93, temperature (!) 97.5 F (36.4 C), temperature source Oral, resp. rate 16, height 6' (1.829 m), weight (!) 155 kg, SpO2 93%.   Exam: Gen Exam:Alert awake-not in any distress HEENT:atraumatic, normocephalic Chest: B/L clear to auscultation anteriorly-hardly any rhonchi CVS:S1S2 regular Abdomen:soft non tender, non distended Extremities:no edema Neurology: Non focal Skin: no rash  Pertinent Labs/Radiology:    Latest Ref Rng & Units 07/01/2023    3:31 AM 06/30/2023    4:28 AM 06/29/2023    2:41 PM  CBC  WBC 4.0 - 10.5 K/uL 11.5  6.5  10.0   Hemoglobin 13.0 - 17.0 g/dL 65.7  84.6  96.2   Hematocrit 39.0 - 52.0 % 40.1  42.1  43.2   Platelets 150 - 400 K/uL 194  165  188     Lab Results  Component Value Date   NA 139 07/01/2023   K 4.5 07/01/2023   CL 104 07/01/2023   CO2 27 07/01/2023      Assessment/Plan: COPD exacerbation Improving Taper steroids Continue bronchodilators Mobilize/incentive spirometry/flutter valve  Mycoplasma/rhinovirus infection No obvious  pneumonia-but could have small infiltrates not seen on CXR Supportive care Continue Zithromax  AKI Hemodynamically mediated Improving with supportive care  DM-2 (A1c 9.1 on 11/5) with uncontrolled hyperglycemia due to steroids CBGs remain uncontrolled-increase Semglee 20 units twice daily, NovoLog 8 units 3 times daily with meals Continue SSI Follow CBGs closely-as steroids being tapered down  Recent Labs    06/30/23 1717 06/30/23 2125 07/01/23 0852  GLUCAP 336* 285* 268*    HLD Statin  BPH Flomax  GERD PPI  History of DVT No lower extremity swelling evident-D-dimer negative On prophylactic SQ Lovenox  Morbid Obesity: Estimated body mass index is 46.34 kg/m as calculated from the following:   Height as of this encounter: 6' (1.829 m).   Weight as of this encounter: 155 kg.   Code status:   Code Status: Full Code   DVT Prophylaxis: SCDs Start: 06/30/23 0038   Family Communication: None at bedside   Disposition Plan: Status is: Inpatient Remains inpatient appropriate because: Severity of illness   Planned Discharge Destination:Home   Diet: Diet Order             Diet Carb Modified Fluid consistency: Thin; Room service appropriate? Yes  Diet effective now                     Antimicrobial agents: Anti-infectives (From admission, onward)    Start     Dose/Rate Route Frequency Ordered Stop   06/30/23 1600  azithromycin (ZITHROMAX) tablet 500 mg  500 mg Oral Every 24 hours 06/30/23 1415 07/03/23 1559        MEDICATIONS: Scheduled Meds:  aspirin EC  81 mg Oral Daily   azithromycin  500 mg Oral Q24H   bisacodyl  5 mg Oral Once   enoxaparin (LOVENOX) injection  75 mg Subcutaneous Q24H   insulin aspart  0-20 Units Subcutaneous TID WC   insulin aspart  0-5 Units Subcutaneous QHS   insulin aspart  6 Units Subcutaneous TID WC   insulin glargine-yfgn  15 Units Subcutaneous BID   ipratropium-albuterol  3 mL Nebulization TID    methylPREDNISolone (SOLU-MEDROL) injection  80 mg Intravenous Q12H   pantoprazole  40 mg Oral Daily   pneumococcal 20-valent conjugate vaccine  0.5 mL Intramuscular Tomorrow-1000   polyethylene glycol  17 g Oral Daily   rosuvastatin  20 mg Oral Daily   tamsulosin  0.4 mg Oral Daily   Continuous Infusions: PRN Meds:.acetaminophen **OR** acetaminophen, benzonatate, bisacodyl, guaiFENesin, ipratropium-albuterol, melatonin, ondansetron (ZOFRAN) IV, phenol   I have personally reviewed following labs and imaging studies  LABORATORY DATA: CBC: Recent Labs  Lab 06/29/23 1441 06/30/23 0428 07/01/23 0331  WBC 10.0 6.5 11.5*  NEUTROABS  --  5.1  --   HGB 14.2 13.5 12.7*  HCT 43.2 42.1 40.1  MCV 86.2 87.0 89.3  PLT 188 165 194    Basic Metabolic Panel: Recent Labs  Lab 06/29/23 1441 06/29/23 2036 06/30/23 0428 07/01/23 0331  NA 133* 136 136 139  K 5.1 4.9 4.4 4.5  CL 97* 99 102 104  CO2 28 27 24 27   GLUCOSE 403* 437* 304* 200*  BUN 21 24* 24* 35*  CREATININE 1.41* 1.36* 1.39* 1.27*  CALCIUM 9.7 9.8 8.8* 8.6*  MG  --   --  2.1 2.2  PHOS  --   --  2.7  --     GFR: Estimated Creatinine Clearance: 81.9 mL/min (A) (by C-G formula based on SCr of 1.27 mg/dL (H)).  Liver Function Tests: Recent Labs  Lab 06/30/23 0428  AST 11*  ALT 13  ALKPHOS 67  BILITOT 0.6  PROT 6.9  ALBUMIN 3.5   No results for input(s): "LIPASE", "AMYLASE" in the last 168 hours. No results for input(s): "AMMONIA" in the last 168 hours.  Coagulation Profile: No results for input(s): "INR", "PROTIME" in the last 168 hours.  Cardiac Enzymes: No results for input(s): "CKTOTAL", "CKMB", "CKMBINDEX", "TROPONINI" in the last 168 hours.  BNP (last 3 results) No results for input(s): "PROBNP" in the last 8760 hours.  Lipid Profile: No results for input(s): "CHOL", "HDL", "LDLCALC", "TRIG", "CHOLHDL", "LDLDIRECT" in the last 72 hours.  Thyroid Function Tests: No results for input(s): "TSH",  "T4TOTAL", "FREET4", "T3FREE", "THYROIDAB" in the last 72 hours.  Anemia Panel: No results for input(s): "VITAMINB12", "FOLATE", "FERRITIN", "TIBC", "IRON", "RETICCTPCT" in the last 72 hours.  Urine analysis:    Component Value Date/Time   COLORURINE YELLOW 06/30/2023 0020   APPEARANCEUR CLEAR 06/30/2023 0020   LABSPEC 1.023 06/30/2023 0020   PHURINE 5.0 06/30/2023 0020   GLUCOSEU >=500 (A) 06/30/2023 0020   HGBUR NEGATIVE 06/30/2023 0020   BILIRUBINUR NEGATIVE 06/30/2023 0020   KETONESUR 5 (A) 06/30/2023 0020   PROTEINUR 30 (A) 06/30/2023 0020   NITRITE NEGATIVE 06/30/2023 0020   LEUKOCYTESUR NEGATIVE 06/30/2023 0020    Sepsis Labs: Lactic Acid, Venous    Component Value Date/Time   LATICACIDVEN 1.6 07/01/2021 0037    MICROBIOLOGY: Recent Results (from the past 240 hour(s))  Resp panel by RT-PCR (RSV, Flu A&B, Covid) Anterior Nasal Swab     Status: None   Collection Time: 06/29/23  3:58 PM   Specimen: Anterior Nasal Swab  Result Value Ref Range Status   SARS Coronavirus 2 by RT PCR NEGATIVE NEGATIVE Final    Comment: (NOTE) SARS-CoV-2 target nucleic acids are NOT DETECTED.  The SARS-CoV-2 RNA is generally detectable in upper respiratory specimens during the acute phase of infection. The lowest concentration of SARS-CoV-2 viral copies this assay can detect is 138 copies/mL. A negative result does not preclude SARS-Cov-2 infection and should not be used as the sole basis for treatment or other patient management decisions. A negative result may occur with  improper specimen collection/handling, submission of specimen other than nasopharyngeal swab, presence of viral mutation(s) within the areas targeted by this assay, and inadequate number of viral copies(<138 copies/mL). A negative result must be combined with clinical observations, patient history, and epidemiological information. The expected result is Negative.  Fact Sheet for Patients:   BloggerCourse.com  Fact Sheet for Healthcare Providers:  SeriousBroker.it  This test is no t yet approved or cleared by the [place] FDA and  has been authorized for detection and/or diagnosis of SARS-CoV-2 by FDA under an Emergency Use Authorization (EUA). This EUA will remain  in effect (meaning this test can be used) for the duration of the COVID-19 declaration under Section 564(b)(1) of the Act, 21 U.S.C.section 360bbb-3(b)(1), unless the authorization is terminated  or revoked sooner.       Influenza A by PCR NEGATIVE NEGATIVE Final   Influenza B by PCR NEGATIVE NEGATIVE Final    Comment: (NOTE) The Xpert Xpress SARS-CoV-2/FLU/RSV plus assay is intended as an aid in the diagnosis of influenza from Nasopharyngeal swab specimens and should not be used as a sole basis for treatment. Nasal washings and aspirates are unacceptable for Xpert Xpress SARS-CoV-2/FLU/RSV testing.  Fact Sheet for Patients: BloggerCourse.com  Fact Sheet for Healthcare Providers: SeriousBroker.it  This test is not yet approved or cleared by the [place] FDA and has been authorized for detection and/or diagnosis of SARS-CoV-2 by FDA under an Emergency Use Authorization (EUA). This EUA will remain in effect (meaning this test can be used) for the duration of the COVID-19 declaration under Section 564(b)(1) of the Act, 21 U.S.C. section 360bbb-3(b)(1), unless the authorization is terminated or revoked.     Resp Syncytial Virus by PCR NEGATIVE NEGATIVE Final    Comment: (NOTE) Fact Sheet for Patients: BloggerCourse.com  Fact Sheet for Healthcare Providers: SeriousBroker.it  This test is not yet approved or cleared by the [place] FDA and has been authorized for detection and/or diagnosis of SARS-CoV-2 by FDA under an Emergency Use  Authorization (EUA). This EUA will remain in effect (meaning this test can be used) for the duration of the COVID-19 declaration under Section 564(b)(1) of the Act, 21 U.S.C. section 360bbb-3(b)(1), unless the authorization is terminated or revoked.  Performed at [facility], [address], [place], [place] [address]   Culture, blood (Routine X 2) w Reflex to ID Panel     Status: None (Preliminary result)   Collection Time: 06/30/23  1:14 AM   Specimen: BLOOD LEFT ARM  Result Value Ref Range Status   Specimen Description BLOOD LEFT ARM  Final   Special Requests   Final    BOTTLES DRAWN AEROBIC AND ANAEROBIC Blood Culture adequate volume   Culture   Final    NO GROWTH 1 DAY Performed  at [facility], [address] [place], [place] [address]    Report Status PENDING  Incomplete  Culture, blood (Routine X 2) w Reflex to ID Panel     Status: None (Preliminary result)   Collection Time: 06/30/23  1:22 AM   Specimen: BLOOD LEFT HAND  Result Value Ref Range Status   Specimen Description BLOOD LEFT HAND  Final   Special Requests   Final    BOTTLES DRAWN AEROBIC ONLY Blood Culture adequate volume   Culture   Final    NO GROWTH 1 DAY Performed at [facility], [address] [place], [place] [address]    Report Status PENDING  Incomplete  Expectorated Sputum Assessment w Gram Stain, Rflx to Resp Cult     Status: None   Collection Time: 06/30/23  7:51 AM   Specimen: Sputum  Result Value Ref Range Status   Specimen Description SPUTUM  Final   Special Requests NONE  Final   Sputum evaluation   Final    THIS SPECIMEN IS ACCEPTABLE FOR SPUTUM CULTURE Performed at [facility], [address], [place], [place] [address]    Report Status 06/30/2023 FINAL  Final  Culture, Respiratory w Gram Stain     Status: None (Preliminary result)   Collection Time: 06/30/23  7:51 AM   Specimen: SPU  Result Value Ref Range Status   Specimen Description  SPUTUM  Final   Special Requests NONE Reflexed from [id number]  Final   Gram Stain   Final    ABUNDANT WBC PRESENT, PREDOMINANTLY PMN FEW GRAM POSITIVE COCCI IN PAIRS RARE GRAM NEGATIVE RODS Performed at [facility], [address] [place], [place] [address]    Culture PENDING  Incomplete   Report Status PENDING  Incomplete  Respiratory (~20 pathogens) panel by PCR     Status: Abnormal   Collection Time: 06/30/23  7:56 AM   Specimen: Nasopharyngeal Swab; Respiratory  Result Value Ref Range Status   Adenovirus NOT DETECTED NOT DETECTED Final   Coronavirus 229E NOT DETECTED NOT DETECTED Final    Comment: (NOTE) The Coronavirus on the Respiratory Panel, DOES NOT test for the novel  Coronavirus (2019 nCoV)    Coronavirus HKU1 NOT DETECTED NOT DETECTED Final   Coronavirus NL63 NOT DETECTED NOT DETECTED Final   Coronavirus OC43 NOT DETECTED NOT DETECTED Final   Metapneumovirus NOT DETECTED NOT DETECTED Final   Rhinovirus / Enterovirus DETECTED (A) NOT DETECTED Final   Influenza A NOT DETECTED NOT DETECTED Final   Influenza B NOT DETECTED NOT DETECTED Final   Parainfluenza Virus 1 NOT DETECTED NOT DETECTED Final   Parainfluenza Virus 2 NOT DETECTED NOT DETECTED Final   Parainfluenza Virus 3 NOT DETECTED NOT DETECTED Final   Parainfluenza Virus 4 NOT DETECTED NOT DETECTED Final   Respiratory Syncytial Virus NOT DETECTED NOT DETECTED Final   Bordetella pertussis NOT DETECTED NOT DETECTED Final   Bordetella Parapertussis NOT DETECTED NOT DETECTED Final   Chlamydophila pneumoniae NOT DETECTED NOT DETECTED Final   Mycoplasma pneumoniae DETECTED (A) NOT DETECTED Final    Comment: Performed at [facility], [address] [place], [place] [address]    RADIOLOGY STUDIES/RESULTS: DG Chest 2 View  Result Date: 06/29/2023 CLINICAL DATA:  Cough, fever. EXAM: CHEST - 2 VIEW COMPARISON:  July 30, 2021. FINDINGS: The heart size and mediastinal contours are within normal limits. Both  lungs are clear. The visualized skeletal structures are unremarkable. IMPRESSION: No  active cardiopulmonary disease. Electronically Signed   By: Lupita Raider M.D.   On: 06/29/2023 17:31     LOS: 1 day   Jeoffrey Massed, MD  [facility]    To contact the attending provider between 7A-7P or the covering provider during after hours 7P-7A, please log into the web site [email or web address] and access using universal [place] password for that web site. If you do not have the password, please call the hospital operator.  07/01/2023, 11:40 AM

## 2023-07-01 NOTE — Plan of Care (Signed)

## 2023-07-01 NOTE — Evaluation (Signed)
Occupational Therapy Evaluation and DC Summary Patient Details Name: Manuel Leonard MRN: [id number] DOB: March 11, 1951 Today's Date: 07/01/2023   History of Present Illness 72 y.o. male presents to ED 06/29/23 for shortness of breath and coughing and bil LE edema. COPD exacerbation, AKI, hyperglycemia,  PMH COPD, DM2, DVT (not on anticoagulation), HTN, umbilical hernia, OSA, tobacco use disorder.   Clinical Impression   Pt admitted for above, he now denies SOB and reports doing much better. Pt completing Adls and ambulating in room independently with Sp02 stable on 3L, presents close baseline. Messaged MD, flutter valve ordered to help with cough/congestion. No acute skilled OT needed, pt to continue to mobilize with mobility specialists to promote OOB activity tolerance and reduce risk of decline. No follow-up OT needed.         If plan is discharge home, recommend the following: Other (comment) (n/a)    Functional Status Assessment  Patient has not had a recent decline in their functional status  Equipment Recommendations  None recommended by OT    Recommendations for Other Services       Precautions / Restrictions Precautions Precautions: None Restrictions Weight Bearing Restrictions: No      Mobility Bed Mobility               General bed mobility comments: sitting EOB on arrival    Transfers Overall transfer level: Independent Equipment used: None                      Balance Overall balance assessment: Independent                                         ADL either performed or assessed with clinical judgement   ADL Overall ADL's : Independent                                       General ADL Comments: Pt reports wearing slip ons at baseline instead of socks     Vision         Perception         Praxis         Pertinent Vitals/Pain Pain Assessment Pain Assessment: No/denies pain      Extremity/Trunk Assessment Upper Extremity Assessment Upper Extremity Assessment: Overall WFL for tasks assessed   Lower Extremity Assessment Lower Extremity Assessment: Overall WFL for tasks assessed (RLE more edema than R)   Cervical / Trunk Assessment Cervical / Trunk Assessment: Other exceptions Cervical / Trunk Exceptions: overweight   Communication Communication Communication: No apparent difficulties   Cognition Arousal: Alert Behavior During Therapy: WFL for tasks assessed/performed Overall Cognitive Status: Within Functional Limits for tasks assessed                                       General Comments  wife present, Sp02 94% on 3L with room level ambulation. Messaged RN and MD about potential flutter valve to help with congestion/coughing    Exercises     Shoulder Instructions      Home Living Family/patient expects to be discharged to:: Private residence Living Arrangements: Spouse/significant other Available Help at Discharge: Family;Available 24 hours/day Type of Home: House Home Access: Stairs  to enter Entrance Stairs-Number of Steps: 2 Entrance Stairs-Rails: Right Home Layout: Two level;Able to live on main level with bedroom/bathroom     Bathroom Shower/Tub: Producer, television/film/video: Standard Bathroom Accessibility: Yes   Home Equipment: None          Prior Functioning/Environment Prior Level of Function : Independent/Modified Independent               ADLs Comments: sits to shower        OT Problem List: Other (comment) (SOB)      OT Treatment/Interventions:      OT Goals(Current goals can be found in the care plan section) Acute Rehab OT Goals OT Goal Formulation: All assessment and education complete, DC therapy Time For Goal Achievement: 07/15/23 Potential to Achieve Goals: Good  OT Frequency:      Co-evaluation              AM-PAC OT "6 Clicks" Daily Activity     Outcome Measure  Help from another person eating meals?: None Help from another person taking care of personal grooming?: None Help from another person toileting, which includes using toliet, bedpan, or urinal?: None Help from another person bathing (including washing, rinsing, drying)?: None Help from another person to put on and taking off regular upper body clothing?: None Help from another person to put on and taking off regular lower body clothing?: None 6 Click Score: 24   End of Session Nurse Communication: Mobility status  Activity Tolerance: Patient tolerated treatment well Patient left: in bed;with call bell/phone within reach;with family/visitor present  OT Visit Diagnosis: Other (comment) (SOB)                Time: 1610-9604 OT Time Calculation (min): 11 min Charges:  OT General Charges $OT Visit: 1 Visit OT Evaluation $OT Eval Low Complexity: 1 Low  07/01/2023  AB, OTR/L  Acute Rehabilitation Services  Office: [phone]   Tristan Schroeder 07/01/2023, 2:54 PM

## 2023-07-01 NOTE — Plan of Care (Signed)
Patient feeling much better. Blood sugar more controlled.

## 2023-07-02 ENCOUNTER — Other Ambulatory Visit (HOSPITAL_COMMUNITY): Payer: Self-pay

## 2023-07-02 DIAGNOSIS — J441 Chronic obstructive pulmonary disease with (acute) exacerbation: Secondary | ICD-10-CM | POA: Diagnosis not present

## 2023-07-02 DIAGNOSIS — E0810 Diabetes mellitus due to underlying condition with ketoacidosis without coma: Secondary | ICD-10-CM

## 2023-07-02 DIAGNOSIS — K219 Gastro-esophageal reflux disease without esophagitis: Secondary | ICD-10-CM

## 2023-07-02 DIAGNOSIS — Z794 Long term (current) use of insulin: Secondary | ICD-10-CM

## 2023-07-02 DIAGNOSIS — I10 Essential (primary) hypertension: Secondary | ICD-10-CM | POA: Diagnosis not present

## 2023-07-02 LAB — GLUCOSE, CAPILLARY: Glucose-Capillary: 217 mg/dL — ABNORMAL HIGH (ref 70–99)

## 2023-07-02 MED ORDER — PREDNISONE 20 MG PO TABS: 40.0000 mg | ORAL_TABLET | Freq: Every day | ORAL | Status: DC

## 2023-07-02 MED ORDER — INCRUSE ELLIPTA 62.5 MCG/ACT IN AEPB
1.0000 | INHALATION_SPRAY | Freq: Every day | RESPIRATORY_TRACT | 2 refills | Status: DC
  Filled 2023-07-02: qty 30, 30d supply, fill #0

## 2023-07-02 MED ORDER — AZITHROMYCIN 500 MG PO TABS
500.0000 mg | ORAL_TABLET | ORAL | 0 refills | Status: AC
  Filled 2023-07-02: qty 3, 3d supply, fill #0

## 2023-07-02 MED ORDER — PREDNISONE 10 MG PO TABS
ORAL_TABLET | ORAL | 0 refills | Status: AC
  Filled 2023-07-02: qty 10, 4d supply, fill #0

## 2023-07-02 MED ORDER — FLUTICASONE-SALMETEROL 250-50 MCG/ACT IN AEPB
1.0000 | INHALATION_SPRAY | Freq: Two times a day (BID) | RESPIRATORY_TRACT | 12 refills | Status: DC
  Filled 2023-07-02: qty 1, fill #0
  Filled 2023-07-02: qty 60, 30d supply, fill #0

## 2023-07-02 NOTE — Plan of Care (Signed)

## 2023-07-02 NOTE — Progress Notes (Signed)
Mobility Specialist Progress Note;   07/02/23 0900  Mobility  Activity Ambulated independently in hallway  Level of Assistance Standby assist, set-up cues, supervision of patient - no hands on  Assistive Device None  Distance Ambulated (ft) 100 ft  Activity Response Tolerated well  Mobility Referral Yes  $Mobility charge 1 Mobility  Mobility Specialist Start Time (ACUTE ONLY) 0900  Mobility Specialist Stop Time (ACUTE ONLY) 0910  Mobility Specialist Time Calculation (min) (ACUTE ONLY) 10 min   Pt agreeable to mobility. Required no physical assistance during ambulation, SV. RN in room stated he was doing fine with no O2, therefore ambulated on RA. VSS throughout. Encouraged pursed lip breathing and slowing down his walking pace to conserve energy levels. Pt back sitting on EOB with all needs met. RN and wife in room.   Caesar Bookman Mobility Specialist Please contact via SecureChat or Rehab Office [phone]

## 2023-07-02 NOTE — Discharge Summary (Signed)
PATIENT DETAILS Name: Manuel Leonard Age: 72 y.o. Sex: male Date of Birth: 06-29-1951 MRN: [id number]. Admitting Physician: Gery Pray, MD AOZ:[id number], Wille Celeste, DO  Admit Date: 06/29/2023 Discharge date: 07/02/2023  Recommendations for Outpatient Follow-up:  Follow up with PCP in 1-2 weeks Please obtain CMP/CBC in one week  Admitted From:  Home  Disposition: Home   Discharge Condition: good  CODE STATUS:   Code Status: Full Code   Diet recommendation:  Diet Order             Diet - low sodium heart healthy           Diet Carb Modified           Diet Carb Modified Fluid consistency: Thin; Room service appropriate? Yes  Diet effective now                    Brief Summary: Patient is a 72 y.o.  male with history of COPD, DM-2, HTN, HLD, OSA, GERD-presented with several days history of cough/cold/chest congestion-found to have COPD exacerbation in the setting of rhinovirus/mycoplasma infection.   Significant events: 11/4>> admit to [facility]   Significant studies: 11/4>> CXR: No PNA   Significant microbiology data: 11/4>> COVID/influenza/RSV PCR: Negative 11/5>> respiratory virus panel:+ve for rhinovirus/mycoplasma 11/5>> sputum culture: Pending 11/5>> blood culture: No growth   Procedures: None   Consults: None  Brief Hospital Course: COPD exacerbation Much improved-on room air this morning-hardly any wheezing On IV steroids-transition to tapering prednisone Continue bronchodilators Since he feels better-stable for discharge today with outpatient follow-up with PCP/primary pulmonologist.   Mycoplasma/rhinovirus infection No obvious pneumonia-but could have small infiltrates not seen on CXR Continue Zithromax  Some URI symptoms but overall stable.   AKI Hemodynamically mediated Improving with supportive care Repeat lites at PCPs office   DM-2 (A1c 9.1 on 11/5) with uncontrolled hyperglycemia due to steroids CBGs better  controlled-suspect as steroids get tapered down further-they will improve Continue usual outpatient regimen on discharge.  HLD Statin   BPH Flomax   GERD PPI   History of DVT No lower extremity swelling evident-D-dimer negative Was on on prophylactic SQ Lovenox   Morbid Obesity: Estimated body mass index is 46.34 kg/m as calculated from the following:   Height as of this encounter: 6' (1.829 m).   Weight as of this encounter: 155 kg.   Discharge Diagnoses:  Principal Problem:   Acute exacerbation of chronic obstructive pulmonary disease (COPD) (HCC) Active Problems:   Tobacco abuse   Hypertension   Diabetes mellitus (HCC)   GERD (gastroesophageal reflux disease)   Hyperlipidemia   Constipation   AKI (acute kidney injury) [facility])   Discharge Instructions:  Activity:  As tolerated  Discharge Instructions     Call MD for:  difficulty breathing, headache or visual disturbances   Complete by: As directed    Diet - low sodium heart healthy   Complete by: As directed    Diet Carb Modified   Complete by: As directed    Discharge instructions   Complete by: As directed    Follow with Primary MD  Koren Shiver, DO in 1-2 weeks  Please get a complete blood count and chemistry panel checked by your Primary MD at your next visit, and again as instructed by your Primary MD.  Get Medicines reviewed and adjusted: Please take all your medications with you for your next visit with your Primary MD  Laboratory/radiological data: Please request your Primary MD to go over  all hospital tests and procedure/radiological results at the follow up, please ask your Primary MD to get all Hospital records sent to his/her office.  In some cases, they will be blood work, cultures and biopsy results pending at the time of your discharge. Please request that your primary care M.D. follows up on these results.  Also Note the following: If you experience worsening of your admission  symptoms, develop shortness of breath, life threatening emergency, suicidal or homicidal thoughts you must seek medical attention immediately by calling 911 or calling your MD immediately  if symptoms less severe.  You must read complete instructions/literature along with all the possible adverse reactions/side effects for all the Medicines you take and that have been prescribed to you. Take any new Medicines after you have completely understood and accpet all the possible adverse reactions/side effects.   Do not drive when taking Pain medications or sleeping medications (Benzodaizepines)  Do not take more than prescribed Pain, Sleep and Anxiety Medications. It is not advisable to combine anxiety,sleep and pain medications without talking with your primary care practitioner  Special Instructions: If you have smoked or chewed Tobacco  in the last 2 yrs please stop smoking, stop any regular Alcohol  and or any Recreational drug use.  Wear Seat belts while driving.  Please note: You were cared for by a hospitalist during your hospital stay. Once you are discharged, your primary care physician will handle any further medical issues. Please note that NO REFILLS for any discharge medications will be authorized once you are discharged, as it is imperative that you return to your primary care physician (or establish a relationship with a primary care physician if you do not have one) for your post hospital discharge needs so that they can reassess your need for medications and monitor your lab values.   Increase activity slowly   Complete by: As directed       Allergies as of 07/02/2023   No Known Allergies      Medication List     TAKE these medications    albuterol 108 (90 Base) MCG/ACT inhaler Commonly known as: VENTOLIN HFA Inhale 2 puffs into the lungs every 6 (six) hours as needed for wheezing or shortness of breath.   aspirin EC 81 MG tablet Take 1 tablet (81 mg total) by mouth  daily.   azithromycin 500 MG tablet Commonly known as: ZITHROMAX Take 1 tablet (500 mg total) by mouth daily for 3 days.   budesonide-formoterol 160-4.5 MCG/ACT inhaler Commonly known as: Symbicort Inhale 2 puffs into the lungs in the morning and at bedtime.   ibuprofen 200 MG tablet Commonly known as: ADVIL Take 600 mg by mouth every 6 (six) hours as needed for mild pain.   Incruse Ellipta 62.5 MCG/ACT Aepb Generic drug: umeclidinium bromide Inhale 1 puff into the lungs daily.   ipratropium-albuterol 0.5-2.5 (3) MG/3ML Soln Commonly known as: DUONEB Take 3 mLs by nebulization every 6 (six) hours as needed (for shortness of breath or wheezing).   metFORMIN 500 MG tablet Commonly known as: GLUCOPHAGE Take 2 (two) times daily with a meal by mouth.   predniSONE 10 MG tablet Commonly known as: DELTASONE Take 40 mg daily for 1 day, 30 mg daily for 1 day, 20 mg daily for 1 days,10 mg daily for 1 day, then stop   rosuvastatin 20 MG tablet Commonly known as: Crestor Take 1 tablet (20 mg total) by mouth daily.   tamsulosin 0.4 MG Caps capsule Commonly known  as: FLOMAX Take 0.4 mg by mouth daily.   Trulicity 0.75 MG/0.5ML Soaj Generic drug: Dulaglutide Inject 0.75 mg into the skin once a week. Wednesday        Follow-up Information     Koren Shiver, DO. Schedule an appointment as soon as possible for a visit in 1 week(s).   Specialty: Family Medicine Contact information: [address] [place] [address] [phone]         Leslye Peer, MD. Schedule an appointment as soon as possible for a visit in 2 week(s).   Specialty: Pulmonary Disease Contact information: [address] [place] [address] [phone]                No Known Allergies   Other Procedures/Studies: DG Chest 2 View  Result Date: 06/29/2023 CLINICAL DATA:  Cough, fever. EXAM: CHEST - 2 VIEW COMPARISON:  July 30, 2021. FINDINGS: The heart size  and mediastinal contours are within normal limits. Both lungs are clear. The visualized skeletal structures are unremarkable. IMPRESSION: No active cardiopulmonary disease. Electronically Signed   By: Lupita Raider M.D.   On: 06/29/2023 17:31     TODAY-DAY OF DISCHARGE:  Subjective:   Manuel Leonard today has no headache,no chest abdominal pain,no new weakness tingling or numbness, feels much better wants to go home today.   Objective:   Blood pressure 109/88, pulse 87, temperature 97.9 F (36.6 C), temperature source Oral, resp. rate 18, height 6' (1.829 m), weight (!) 155 kg, SpO2 92%.  Intake/Output Summary (Last 24 hours) at 07/02/2023 0855 Last data filed at 07/01/2023 1951 Gross per 24 hour  Intake 800 ml  Output 1200 ml  Net -400 ml   Filed Weights   06/29/23 1441  Weight: (!) 155 kg    Exam: Awake Alert, Oriented *3, No new F.N deficits, Normal affect [place].AT,PERRAL Supple Neck,No JVD, No cervical lymphadenopathy appriciated.  Symmetrical Chest wall movement, Good air movement bilaterally, CTAB RRR,No Gallops,Rubs or new Murmurs, No Parasternal Heave +ve B.Sounds, Abd Soft, Non tender, No organomegaly appriciated, No rebound -guarding or rigidity. No Cyanosis, Clubbing or edema, No new Rash or bruise   PERTINENT RADIOLOGIC STUDIES: No results found.   PERTINENT LAB RESULTS: CBC: Recent Labs    06/30/23 0428 07/01/23 0331  WBC 6.5 11.5*  HGB 13.5 12.7*  HCT 42.1 40.1  PLT 165 194   CMET CMP     Component Value Date/Time   NA 139 07/01/2023 0331   NA 142 01/07/2022 1116   K 4.5 07/01/2023 0331   CL 104 07/01/2023 0331   CO2 27 07/01/2023 0331   GLUCOSE 200 (H) 07/01/2023 0331   BUN 35 (H) 07/01/2023 0331   BUN 17 01/07/2022 1116   CREATININE 1.27 (H) 07/01/2023 0331   CALCIUM 8.6 (L) 07/01/2023 0331   PROT 6.9 06/30/2023 0428   ALBUMIN 3.5 06/30/2023 0428   AST 11 (L) 06/30/2023 0428   ALT 13 06/30/2023 0428   ALKPHOS 67 06/30/2023 0428    BILITOT 0.6 06/30/2023 0428   EGFR 70 01/07/2022 1116   GFRNONAA >60 07/01/2023 0331    GFR Estimated Creatinine Clearance: 81.9 mL/min (A) (by C-G formula based on SCr of 1.27 mg/dL (H)). No results for input(s): "LIPASE", "AMYLASE" in the last 72 hours. No results for input(s): "CKTOTAL", "CKMB", "CKMBINDEX", "TROPONINI" in the last 72 hours. Invalid input(s): "POCBNP" Recent Labs    06/30/23 0917  DDIMER 0.48   Recent Labs  06/30/23 0123  HGBA1C 9.1*   No results for input(s): "CHOL", "HDL", "LDLCALC", "TRIG", "CHOLHDL", "LDLDIRECT" in the last 72 hours. No results for input(s): "TSH", "T4TOTAL", "T3FREE", "THYROIDAB" in the last 72 hours.  Invalid input(s): "FREET3" No results for input(s): "VITAMINB12", "FOLATE", "FERRITIN", "TIBC", "IRON", "RETICCTPCT" in the last 72 hours. Coags: No results for input(s): "INR" in the last 72 hours.  Invalid input(s): "PT" Microbiology: Recent Results (from the past 240 hour(s))  Resp panel by RT-PCR (RSV, Flu A&B, Covid) Anterior Nasal Swab     Status: None   Collection Time: 06/29/23  3:58 PM   Specimen: Anterior Nasal Swab  Result Value Ref Range Status   SARS Coronavirus 2 by RT PCR NEGATIVE NEGATIVE Final    Comment: (NOTE) SARS-CoV-2 target nucleic acids are NOT DETECTED.  The SARS-CoV-2 RNA is generally detectable in upper respiratory specimens during the acute phase of infection. The lowest concentration of SARS-CoV-2 viral copies this assay can detect is 138 copies/mL. A negative result does not preclude SARS-Cov-2 infection and should not be used as the sole basis for treatment or other patient management decisions. A negative result may occur with  improper specimen collection/handling, submission of specimen other than nasopharyngeal swab, presence of viral mutation(s) within the areas targeted by this assay, and inadequate number of viral copies(<138 copies/mL). A negative result must be combined with clinical  observations, patient history, and epidemiological information. The expected result is Negative.  Fact Sheet for Patients:  BloggerCourse.com  Fact Sheet for Healthcare Providers:  SeriousBroker.it  This test is no t yet approved or cleared by the [place] FDA and  has been authorized for detection and/or diagnosis of SARS-CoV-2 by FDA under an Emergency Use Authorization (EUA). This EUA will remain  in effect (meaning this test can be used) for the duration of the COVID-19 declaration under Section 564(b)(1) of the Act, 21 U.S.C.section 360bbb-3(b)(1), unless the authorization is terminated  or revoked sooner.       Influenza A by PCR NEGATIVE NEGATIVE Final   Influenza B by PCR NEGATIVE NEGATIVE Final    Comment: (NOTE) The Xpert Xpress SARS-CoV-2/FLU/RSV plus assay is intended as an aid in the diagnosis of influenza from Nasopharyngeal swab specimens and should not be used as a sole basis for treatment. Nasal washings and aspirates are unacceptable for Xpert Xpress SARS-CoV-2/FLU/RSV testing.  Fact Sheet for Patients: BloggerCourse.com  Fact Sheet for Healthcare Providers: SeriousBroker.it  This test is not yet approved or cleared by the [place] FDA and has been authorized for detection and/or diagnosis of SARS-CoV-2 by FDA under an Emergency Use Authorization (EUA). This EUA will remain in effect (meaning this test can be used) for the duration of the COVID-19 declaration under Section 564(b)(1) of the Act, 21 U.S.C. section 360bbb-3(b)(1), unless the authorization is terminated or revoked.     Resp Syncytial Virus by PCR NEGATIVE NEGATIVE Final    Comment: (NOTE) Fact Sheet for Patients: BloggerCourse.com  Fact Sheet for Healthcare Providers: SeriousBroker.it  This test is not yet approved or cleared by  the [place] FDA and has been authorized for detection and/or diagnosis of SARS-CoV-2 by FDA under an Emergency Use Authorization (EUA). This EUA will remain in effect (meaning this test can be used) for the duration of the COVID-19 declaration under Section 564(b)(1) of the Act, 21 U.S.C. section 360bbb-3(b)(1), unless the authorization is terminated or revoked.  Performed at [facility], [address], [place], [place] [address]   Culture, blood (  Routine X 2) w Reflex to ID Panel     Status: None (Preliminary result)   Collection Time: 06/30/23  1:14 AM   Specimen: BLOOD LEFT ARM  Result Value Ref Range Status   Specimen Description BLOOD LEFT ARM  Final   Special Requests   Final    BOTTLES DRAWN AEROBIC AND ANAEROBIC Blood Culture adequate volume   Culture   Final    NO GROWTH 2 DAYS Performed at [facility], [address], [place], [place] [address]    Report Status PENDING  Incomplete  Culture, blood (Routine X 2) w Reflex to ID Panel     Status: None (Preliminary result)   Collection Time: 06/30/23  1:22 AM   Specimen: BLOOD LEFT HAND  Result Value Ref Range Status   Specimen Description BLOOD LEFT HAND  Final   Special Requests   Final    BOTTLES DRAWN AEROBIC ONLY Blood Culture adequate volume   Culture   Final    NO GROWTH 2 DAYS Performed at [facility], [address] [place], [place] [address]    Report Status PENDING  Incomplete  Expectorated Sputum Assessment w Gram Stain, Rflx to Resp Cult     Status: None   Collection Time: 06/30/23  7:51 AM   Specimen: Sputum  Result Value Ref Range Status   Specimen Description SPUTUM  Final   Special Requests NONE  Final   Sputum evaluation   Final    THIS SPECIMEN IS ACCEPTABLE FOR SPUTUM CULTURE Performed at [facility], [address] [place], [place] [address]    Report Status 06/30/2023 FINAL  Final  Culture, Respiratory w Gram Stain     Status: None (Preliminary  result)   Collection Time: 06/30/23  7:51 AM   Specimen: SPU  Result Value Ref Range Status   Specimen Description SPUTUM  Final   Special Requests NONE Reflexed from [id number]  Final   Gram Stain   Final    ABUNDANT WBC PRESENT, PREDOMINANTLY PMN FEW GRAM POSITIVE COCCI IN PAIRS RARE GRAM NEGATIVE RODS    Culture   Final    CULTURE REINCUBATED FOR BETTER GROWTH Performed at [facility], [address], [place], [place] [address]    Report Status PENDING  Incomplete  Respiratory (~20 pathogens) panel by PCR     Status: Abnormal   Collection Time: 06/30/23  7:56 AM   Specimen: Nasopharyngeal Swab; Respiratory  Result Value Ref Range Status   Adenovirus NOT DETECTED NOT DETECTED Final   Coronavirus 229E NOT DETECTED NOT DETECTED Final    Comment: (NOTE) The Coronavirus on the Respiratory Panel, DOES NOT test for the novel  Coronavirus (2019 nCoV)    Coronavirus HKU1 NOT DETECTED NOT DETECTED Final   Coronavirus NL63 NOT DETECTED NOT DETECTED Final   Coronavirus OC43 NOT DETECTED NOT DETECTED Final   Metapneumovirus NOT DETECTED NOT DETECTED Final   Rhinovirus / Enterovirus DETECTED (A) NOT DETECTED Final   Influenza A NOT DETECTED NOT DETECTED Final   Influenza B NOT DETECTED NOT DETECTED Final   Parainfluenza Virus 1 NOT DETECTED NOT DETECTED Final   Parainfluenza Virus 2 NOT DETECTED NOT DETECTED Final   Parainfluenza Virus 3 NOT DETECTED NOT DETECTED Final   Parainfluenza Virus 4 NOT DETECTED NOT DETECTED Final   Respiratory Syncytial Virus NOT DETECTED NOT DETECTED Final   Bordetella pertussis NOT DETECTED NOT DETECTED Final   Bordetella Parapertussis NOT DETECTED NOT DETECTED Final  Chlamydophila pneumoniae NOT DETECTED NOT DETECTED Final   Mycoplasma pneumoniae DETECTED (A) NOT DETECTED Final    Comment: Performed at [facility], [address] [place], [place] [address]    FURTHER DISCHARGE INSTRUCTIONS:  Get Medicines reviewed and adjusted: Please  take all your medications with you for your next visit with your Primary MD  Laboratory/radiological data: Please request your Primary MD to go over all hospital tests and procedure/radiological results at the follow up, please ask your Primary MD to get all Hospital records sent to his/her office.  In some cases, they will be blood work, cultures and biopsy results pending at the time of your discharge. Please request that your primary care M.D. goes through all the records of your hospital data and follows up on these results.  Also Note the following: If you experience worsening of your admission symptoms, develop shortness of breath, life threatening emergency, suicidal or homicidal thoughts you must seek medical attention immediately by calling 911 or calling your MD immediately  if symptoms less severe.  You must read complete instructions/literature along with all the possible adverse reactions/side effects for all the Medicines you take and that have been prescribed to you. Take any new Medicines after you have completely understood and accpet all the possible adverse reactions/side effects.   Do not drive when taking Pain medications or sleeping medications (Benzodaizepines)  Do not take more than prescribed Pain, Sleep and Anxiety Medications. It is not advisable to combine anxiety,sleep and pain medications without talking with your primary care practitioner  Special Instructions: If you have smoked or chewed Tobacco  in the last 2 yrs please stop smoking, stop any regular Alcohol  and or any Recreational drug use.  Wear Seat belts while driving.  Please note: You were cared for by a hospitalist during your hospital stay. Once you are discharged, your primary care physician will handle any further medical issues. Please note that NO REFILLS for any discharge medications will be authorized once you are discharged, as it is imperative that you return to your primary care physician (or  establish a relationship with a primary care physician if you do not have one) for your post hospital discharge needs so that they can reassess your need for medications and monitor your lab values.  Total Time spent coordinating discharge including counseling, education and face to face time equals greater than 30 minutes.  SignedJeoffrey Massed 07/02/2023 8:55 AM

## 2023-07-03 LAB — CULTURE, RESPIRATORY W GRAM STAIN: Culture: NORMAL

## 2023-07-05 LAB — CULTURE, BLOOD (ROUTINE X 2)
Culture: NO GROWTH
Culture: NO GROWTH
Special Requests: ADEQUATE
Special Requests: ADEQUATE

## 2023-07-09 ENCOUNTER — Ambulatory Visit (INDEPENDENT_AMBULATORY_CARE_PROVIDER_SITE_OTHER): Payer: Medicare Other | Admitting: Primary Care

## 2023-07-09 ENCOUNTER — Ambulatory Visit: Payer: Medicare Other

## 2023-07-09 ENCOUNTER — Encounter: Payer: Self-pay | Admitting: Primary Care

## 2023-07-09 VITALS — BP 108/78 | HR 99 | Temp 97.5°F | Ht <= 58 in | Wt 310.6 lb

## 2023-07-09 DIAGNOSIS — J441 Chronic obstructive pulmonary disease with (acute) exacerbation: Secondary | ICD-10-CM

## 2023-07-09 DIAGNOSIS — I700 Atherosclerosis of aorta: Secondary | ICD-10-CM | POA: Diagnosis not present

## 2023-07-09 DIAGNOSIS — R918 Other nonspecific abnormal finding of lung field: Secondary | ICD-10-CM | POA: Diagnosis not present

## 2023-07-09 DIAGNOSIS — E119 Type 2 diabetes mellitus without complications: Secondary | ICD-10-CM

## 2023-07-09 MED ORDER — GUAIFENESIN ER 600 MG PO TB12: 1200.0000 mg | ORAL_TABLET | Freq: Two times a day (BID) | ORAL | 1 refills | Status: DC | PRN

## 2023-07-09 MED ORDER — TRELEGY ELLIPTA 100-62.5-25 MCG/ACT IN AEPB
2.0000 | INHALATION_SPRAY | Freq: Every day | RESPIRATORY_TRACT | Status: AC
Start: 2023-07-09 — End: ?

## 2023-07-09 NOTE — Progress Notes (Signed)
@Patient  ID: Manuel Leonard, male    DOB: November 24, 1950, 72 y.o.   MRN: [id number]  Chief Complaint  Patient presents with   Follow-up    Post hospital-sob, cough-white, wheezing, chest congestion    Referring provider: Koren Shiver, DO  HPI: 72 year old male, former smoker. PMH significant for COPD, multiple pulmonary nodules, OSA, hx DVT, HTN, GERD, hyperlipidemia, obesity.   07/09/2023 Discussed the use of AI scribe software for clinical note transcription with the patient, who gave verbal consent to proceed.  History of Present Illness   The patient presents for a hospital follow-up AECOPD. He reported a significant cough and congestion, which prompted an urgent care visit prior to his hospitalization. At the urgent care, a chest x-ray was performed due to concerns of bronchitis or pneumonia, and fluid was reportedly found in the lungs. The patient also reported fluid accumulation in his legs. However, a subsequent chest x-ray at a different hospital reportedly did not show any abnormalities, leading to confusion and concern for the patient.  During his hospital stay, the patient's blood sugar levels were found to be significantly elevated, reaching up to 500. This led to an adjustment in his diabetes management, including the addition of Trulicity, which he reported has helped to lower his blood sugar levels to around 200.  The patient also reported issues with his current inhaler, Symbicort, which he found to be too strong and caused oral fungal infections. He expressed a preference for a less potent inhaler he had previously used in [place]. He also reported using a smaller nebulizer, purchased on [employer], to help with his respiratory symptoms.  The patient's primary concern at this time is the persistent congestion, which he finds very distressing. He gets short of breath coughing episodes. He is not currently taking mucinex. He has a flutter valve but is not using. He also  expressed a strong fear of his diabetes becoming uncontrolled again.      No Known Allergies  Immunization History  Administered Date(s) Administered   Fluad Quad(high Dose 65+) 06/26/2023   Influenza, High Dose Seasonal PF 07/08/2017   Influenza,inj,Quad PF,6+ Mos 08/26/2011, 07/03/2015, 08/29/2016   Influenza-Unspecified 08/26/2011, 08/26/2011, 07/03/2015, 07/03/2015, 08/29/2016, 07/08/2017   PFIZER(Purple Top)SARS-COV-2 Vaccination 10/05/2019, 10/26/2019    Past Medical History:  Diagnosis Date   Abnormal EKG 12/03/2017   Acute bronchitis 12/29/2016   Adenomatous polyps    Chest discomfort 12/03/2017   COPD (chronic obstructive pulmonary disease) (HCC)    Diabetes mellitus (HCC) 12/03/2017   Emphysema/COPD (HCC)    GERD (gastroesophageal reflux disease)    Hyperlipidemia 12/21/2015   Hypertension    not on meds    Leukocytosis 09/27/2014   Multiple pulmonary nodules    Pneumonia    hx of 2017    Sepsis (HCC) 09/27/2014   Tobacco abuse    Umbilical hernia 10/20/2016    Tobacco History: Social History   Tobacco Use  Smoking Status Former   Current packs/day: 0.00   Average packs/day: 2.0 packs/day for 48.0 years (96.0 ttl pk-yrs)   Types: Cigarettes   Start date: 06/30/1973   Quit date: 06/30/2021   Years since quitting: 2.0   Passive exposure: Past  Smokeless Tobacco Never   Counseling given: Not Answered   Outpatient Medications Prior to Visit  Medication Sig Dispense Refill   albuterol (VENTOLIN HFA) 108 (90 Base) MCG/ACT inhaler Inhale 2 puffs into the lungs every 6 (six) hours as needed for wheezing or shortness of breath. 8 g  6   aspirin 81 MG EC tablet Take 1 tablet (81 mg total) by mouth daily. 30 tablet 3   ibuprofen (ADVIL) 200 MG tablet Take 600 mg by mouth every 6 (six) hours as needed for mild pain.     ipratropium-albuterol (DUONEB) 0.5-2.5 (3) MG/3ML SOLN Take 3 mLs by nebulization every 6 (six) hours as needed (for shortness of breath or wheezing). 75 mL  3   metFORMIN (GLUCOPHAGE) 500 MG tablet Take 2 (two) times daily with a meal by mouth.     rosuvastatin (CRESTOR) 20 MG tablet Take 1 tablet (20 mg total) by mouth daily. 30 tablet 11   tamsulosin (FLOMAX) 0.4 MG CAPS capsule Take 0.4 mg by mouth daily.     TRULICITY 0.75 MG/0.5ML SOPN Inject 0.75 mg into the skin once a week. Wednesday     umeclidinium bromide (INCRUSE ELLIPTA) 62.5 MCG/ACT AEPB Inhale 1 puff into the lungs daily. 30 each 2   fluticasone-salmeterol (ADVAIR DISKUS) 250-50 MCG/ACT AEPB Inhale 1 puff into the lungs 2 (two) times daily. (Patient not taking: Reported on 07/09/2023) 60 each 12   No facility-administered medications prior to visit.   Review of Systems  Review of Systems  Constitutional: Negative.   HENT:  Positive for congestion.   Respiratory:  Positive for cough.   Cardiovascular: Negative.    Physical Exam  BP 108/78 (BP Location: Right Arm, Cuff Size: Large)   Pulse 99   Temp (!) 97.5 F (36.4 C) (Temporal)   Ht (!) 6" (0.152 m)   Wt (!) 310 lb 9.6 oz (140.9 kg)   SpO2 97%   BMI 6065.99 kg/m  Physical Exam Constitutional:      Appearance: Normal appearance.  HENT:     Head: Normocephalic and atraumatic.  Cardiovascular:     Rate and Rhythm: Normal rate and regular rhythm.  Pulmonary:     Breath sounds: Rhonchi present.  Musculoskeletal:        General: Normal range of motion.  Skin:    General: Skin is warm and dry.  Neurological:     General: No focal deficit present.     Mental Status: He is alert and oriented to person, place, and time. Mental status is at baseline.  Psychiatric:        Mood and Affect: Mood normal.        Behavior: Behavior normal.        Thought Content: Thought content normal.        Judgment: Judgment normal.      Lab Results:  CBC    Component Value Date/Time   WBC 11.5 (H) 07/01/2023 0331   RBC 4.49 07/01/2023 0331   HGB 12.7 (L) 07/01/2023 0331   HCT 40.1 07/01/2023 0331   PLT 194 07/01/2023 0331    MCV 89.3 07/01/2023 0331   MCH 28.3 07/01/2023 0331   MCHC 31.7 07/01/2023 0331   RDW 13.8 07/01/2023 0331   LYMPHSABS 1.2 06/30/2023 0428   MONOABS 0.1 06/30/2023 0428   EOSABS 0.0 06/30/2023 0428   BASOSABS 0.0 06/30/2023 0428    BMET    Component Value Date/Time   NA 139 07/01/2023 0331   NA 142 01/07/2022 1116   K 4.5 07/01/2023 0331   CL 104 07/01/2023 0331   CO2 27 07/01/2023 0331   GLUCOSE 200 (H) 07/01/2023 0331   BUN 35 (H) 07/01/2023 0331   BUN 17 01/07/2022 1116   CREATININE 1.27 (H) 07/01/2023 0331   CALCIUM 8.6 (L) 07/01/2023  0331   GFRNONAA >60 07/01/2023 0331   GFRAA >60 05/19/2019 1826    BNP    Component Value Date/Time   BNP 34.7 06/29/2023 1512    ProBNP No results found for: "PROBNP"  Imaging: DG Chest 2 View  Result Date: 06/29/2023 CLINICAL DATA:  Cough, fever. EXAM: CHEST - 2 VIEW COMPARISON:  July 30, 2021. FINDINGS: The heart size and mediastinal contours are within normal limits. Both lungs are clear. The visualized skeletal structures are unremarkable. IMPRESSION: No active cardiopulmonary disease. Electronically Signed   By: Lupita Raider M.D.   On: 06/29/2023 17:31     Assessment & Plan:   1. COPD with acute exacerbation (HCC) - DG Chest 2 View; Future  Chronic Obstructive Pulmonary Disease (COPD): Recent hospitalization for AECOPD. Treated with azithromycin and Prednisone taper with some improvement. Still has a congested cough. Currently using Symbicort and Incruse, recommend changing inhaler to Trelegy  -Discontinue Symbicort and Incruse. -Start Trelegy (sample provided) and assess response over the next 2-4 weeks. -Order chest x-ray to rule out any acute changes. -Continue nebulizer treatments 2-3 times a day followed by flutter valve  -Add Mucinex 1200mg  twice daily to regimen to help with congestion.  Type 2 Diabetes Mellitus: Currently on Trulicity with improved blood sugar control (down to  200). -Continue Trulicity once weekly. -Monitor blood sugar levels regularly.  Follow-up: Schedule appointment with Dr. Delton Coombes within the next 1-2 months.      Manuel Bayley, NP 07/09/2023

## 2023-07-09 NOTE — Patient Instructions (Addendum)
Recommendations: Stop Symbicort and Incruse Start Trelegy one puff daily in the morning (rinse mouth after use)- sample and RX sent  Start mucinex take 2 tablets daily morning and evening with glass of water Use nebulizer 2-3 times a day followed by your flutter valve   Orders: CXR today  Follow-up: 4-6 weeks with DR. Bryum or Beth NP

## 2023-07-10 DIAGNOSIS — Z7185 Encounter for immunization safety counseling: Secondary | ICD-10-CM | POA: Diagnosis not present

## 2023-07-10 DIAGNOSIS — Z125 Encounter for screening for malignant neoplasm of prostate: Secondary | ICD-10-CM | POA: Diagnosis not present

## 2023-07-10 DIAGNOSIS — Z09 Encounter for follow-up examination after completed treatment for conditions other than malignant neoplasm: Secondary | ICD-10-CM | POA: Diagnosis not present

## 2023-07-10 DIAGNOSIS — Z0000 Encounter for general adult medical examination without abnormal findings: Secondary | ICD-10-CM | POA: Diagnosis not present

## 2023-07-10 DIAGNOSIS — J157 Pneumonia due to Mycoplasma pneumoniae: Secondary | ICD-10-CM | POA: Diagnosis not present

## 2023-07-10 DIAGNOSIS — G4733 Obstructive sleep apnea (adult) (pediatric): Secondary | ICD-10-CM | POA: Diagnosis not present

## 2023-07-10 DIAGNOSIS — E118 Type 2 diabetes mellitus with unspecified complications: Secondary | ICD-10-CM | POA: Diagnosis not present

## 2023-07-10 DIAGNOSIS — J441 Chronic obstructive pulmonary disease with (acute) exacerbation: Secondary | ICD-10-CM | POA: Diagnosis not present

## 2023-07-10 DIAGNOSIS — Z1211 Encounter for screening for malignant neoplasm of colon: Secondary | ICD-10-CM | POA: Diagnosis not present

## 2023-07-10 DIAGNOSIS — J449 Chronic obstructive pulmonary disease, unspecified: Secondary | ICD-10-CM | POA: Diagnosis not present

## 2023-07-10 DIAGNOSIS — N400 Enlarged prostate without lower urinary tract symptoms: Secondary | ICD-10-CM | POA: Diagnosis not present

## 2023-07-10 DIAGNOSIS — E782 Mixed hyperlipidemia: Secondary | ICD-10-CM | POA: Diagnosis not present

## 2023-07-30 ENCOUNTER — Other Ambulatory Visit: Payer: Self-pay | Admitting: Emergency Medicine

## 2023-08-05 ENCOUNTER — Telehealth: Payer: Self-pay | Admitting: Primary Care

## 2023-08-05 DIAGNOSIS — E1143 Type 2 diabetes mellitus with diabetic autonomic (poly)neuropathy: Secondary | ICD-10-CM | POA: Diagnosis not present

## 2023-08-05 DIAGNOSIS — R058 Other specified cough: Secondary | ICD-10-CM | POA: Diagnosis not present

## 2023-08-05 DIAGNOSIS — J449 Chronic obstructive pulmonary disease, unspecified: Secondary | ICD-10-CM | POA: Diagnosis not present

## 2023-08-05 NOTE — Telephone Encounter (Signed)
Patient would like results of xray. Patient phone number is [phone].

## 2023-08-06 ENCOUNTER — Ambulatory Visit
Admission: RE | Admit: 2023-08-06 | Discharge: 2023-08-06 | Disposition: A | Discharge status: Home / Self Care | Payer: Medicare Other | Source: Ambulatory Visit | Attending: Family Medicine | Admitting: Family Medicine

## 2023-08-06 ENCOUNTER — Other Ambulatory Visit: Payer: Self-pay | Admitting: Family Medicine

## 2023-08-06 DIAGNOSIS — J449 Chronic obstructive pulmonary disease, unspecified: Secondary | ICD-10-CM | POA: Diagnosis not present

## 2023-08-11 NOTE — Telephone Encounter (Signed)
Patient upset that he had not received results of cxr done 07/09/23. Patient had another cxr ordered by pcp on 08/06/23. I tried to explain to patient that pcp uses [facility] and has access to cxr ordered by [employer]. He was saying Pulm did not even sent result to him pcp.Also informed that there was a delay in getting the results.He ended call with I just don't understand you guys.

## 2023-09-04 DIAGNOSIS — K921 Melena: Secondary | ICD-10-CM | POA: Diagnosis not present

## 2023-09-04 DIAGNOSIS — E1165 Type 2 diabetes mellitus with hyperglycemia: Secondary | ICD-10-CM | POA: Diagnosis not present

## 2023-09-04 DIAGNOSIS — E785 Hyperlipidemia, unspecified: Secondary | ICD-10-CM | POA: Diagnosis not present

## 2023-09-04 DIAGNOSIS — J449 Chronic obstructive pulmonary disease, unspecified: Secondary | ICD-10-CM | POA: Diagnosis not present

## 2023-09-10 ENCOUNTER — Encounter: Payer: Self-pay | Admitting: Emergency Medicine

## 2023-09-10 ENCOUNTER — Ambulatory Visit: Payer: Medicare Other | Admitting: Emergency Medicine

## 2023-09-10 VITALS — BP 136/84 | HR 107 | Ht 72.0 in | Wt 312.0 lb

## 2023-09-10 DIAGNOSIS — J9602 Acute respiratory failure with hypercapnia: Secondary | ICD-10-CM

## 2023-09-10 DIAGNOSIS — J9601 Acute respiratory failure with hypoxia: Secondary | ICD-10-CM | POA: Diagnosis not present

## 2023-09-10 DIAGNOSIS — J449 Chronic obstructive pulmonary disease, unspecified: Secondary | ICD-10-CM | POA: Diagnosis not present

## 2023-09-10 DIAGNOSIS — G4733 Obstructive sleep apnea (adult) (pediatric): Secondary | ICD-10-CM

## 2023-09-10 NOTE — Assessment & Plan Note (Signed)
He has not used his CPAP machine in a couple years.  He is interested in trying to get back on it but he is concerned that the machine is not working appropriately.  He is going to retry it but if the machine malfunctions then we will need to get him a new one.  He will probably need a home sleep study in order to qualify for a new device.

## 2023-09-10 NOTE — Progress Notes (Signed)
Subjective:    Patient ID: Claudia Pollock, male    DOB: Aug 12, 1951, 73 y.o.   MRN: [id number]  HPI 73 yo former smoker (48 pk-yrs), hx HTN, GERD, COPD, renal cysts and hematuria. Was discovered to have a 2-76mm LLL nodule, probably calcified.   ROV 12/06/21 --73 year old gentleman, smoker (48 pack years) whom I have seen in the past for COPD and calcified pulmonary nodular disease.  He is reestablishing care after hospitalization in November 2022.  Past medical history also significant for atrial fibrillation, hypertension with diastolic dysfunction, prior DVT, hyperlipidemia, diabetes, GERD. He was hospitalized with influenza A and CAP, associated hypoxemic respiratory failure requiring intubation and mechanical ventilation.  There is suspicion for obstructive sleep apnea and a polysomnogram has been performed. Currently managed on Incruse.  He has supplemental oxygen 2 L/min.  He is using albuterol very rarely rarely   PSG 08/04/2021 reviewed, showed low AHI but this was done on supplemental oxygen.  His RDI was 19.3/h CPAP titration polysomnogram 11/12/2021 reviewed by me titrated him to CPAP 13 cmH2O  CT chest 06/30/2021 reviewed by me.  The contrast bolus was inadequate to assess for pulmonary embolism.  There were no mediastinal nodes or hilar nodes.  No infiltrates or masses.  No change tiny calcified pulmonary nodules.  ROV 01/15/22 --follow-up visit 73 year old gentleman with history of former tobacco use.  He has COPD, calcified pulmonary nodular disease and obstructive sleep apnea.  I saw him in April at which time we were starting CPAP 13 cmH2O.  He reports today that he has been wearing. More energy during the day. Gets up a few times a night to urinate.  He had been on Incruse and we tried changing to [employer] to see if you get more benefit. He prefers the incruse. He denies any wheeze, sometimes coughs. Still not smoking.  We were able to discontinue his oxygen.  Compliance report  available, shows good control of apneic events.  He is using 87% of the time, 57% of time for greater than 4 hours.  ROV 09/10/2023 --follow-up visit 73 year old man with a history of COPD, obstructive sleep apnea no longer on CPAP, calcified pulmonary nodular disease.  Last seen here in November 2024.  When I last saw him he was on Incruse, not currently taking.  He has been started on Trelegy, hasn't made the change yet from Incruse. He is using albuterol 2x a day. No cough. He had a flare about 2-3 mon ago and received pred + abx.  [place] more than a year ago. He is interested in starting back.  He is worried that the CPAP was not functioning correctly, had pauses at times  Chest x-ray 08/06/2023 reviewed by me shows no evidence of cardiopulmonary disease.  He does have bilateral elevated hemidiaphragms     Review of Systems  Constitutional:  Negative for fever and unexpected weight change.  HENT:  Negative for congestion, dental problem, ear pain, nosebleeds, postnasal drip, rhinorrhea, sinus pressure, sneezing, sore throat and trouble swallowing.   Eyes:  Negative for redness and itching.  Respiratory:  Negative for cough, chest tightness, shortness of breath and wheezing.   Cardiovascular:  Negative for chest pain, palpitations and leg swelling.  Gastrointestinal:  Negative for nausea and vomiting.  Genitourinary:  Negative for dysuria.  Musculoskeletal:  Negative for joint swelling.  Skin:  Negative for rash.  Neurological:  Negative for headaches.  Hematological:  Does not bruise/bleed easily.  Psychiatric/Behavioral:  Negative for dysphoric mood. The patient is not nervous/anxious.     Past Medical History:  Diagnosis Date   Abnormal EKG 12/03/2017   Acute bronchitis 12/29/2016   Adenomatous polyps    Chest discomfort 12/03/2017   COPD (chronic obstructive pulmonary disease) (HCC)    Diabetes mellitus (HCC) 12/03/2017   Emphysema/COPD (HCC)     GERD (gastroesophageal reflux disease)    Hyperlipidemia 12/21/2015   Hypertension    not on meds    Leukocytosis 09/27/2014   Multiple pulmonary nodules    Pneumonia    hx of 2017    Sepsis (HCC) 09/27/2014   Tobacco abuse    Umbilical hernia 10/20/2016     Family History  Problem Relation Age of Onset   Lung cancer Brother    Stomach cancer Brother    Cancer Father      Social History   Socioeconomic History   Marital status: Married    Spouse name: Not on file   Number of children: Not on file   Years of education: Not on file   Highest education level: Not on file  Occupational History   Occupation: Sports administrator  Tobacco Use   Smoking status: Former    Current packs/day: 0.00    Average packs/day: 2.0 packs/day for 48.0 years (96.0 ttl pk-yrs)    Types: Cigarettes    Start date: 06/30/1973    Quit date: 06/30/2021    Years since quitting: 2.1    Passive exposure: Past   Smokeless tobacco: Never  Vaping Use   Vaping status: Never Used  Substance and Sexual Activity   Alcohol use: Yes    Alcohol/week: 0.0 standard drinks of alcohol    Comment: occasional glass of wine or beer    Drug use: No   Sexual activity: Not on file  Other Topics Concern   Not on file  Social History Narrative   Not on file   Social Drivers of Health   Financial Resource Strain: Not on file  Food Insecurity: No Food Insecurity (06/30/2023)   Hunger Vital Sign    Worried About Running Out of Food in the Last Year: Never true    Ran Out of Food in the Last Year: Never true  Transportation Needs: No Transportation Needs (06/30/2023)   PRAPARE - Administrator, Civil Service (Medical): No    Lack of Transportation (Non-Medical): No  Physical Activity: Not on file  Stress: Not on file  Social Connections: Not on file  Intimate Partner Violence: Not At Risk (06/30/2023)   Humiliation, Afraid, Rape, and Kick questionnaire    Fear of Current or Ex-Partner: No    Emotionally  Abused: No    Physically Abused: No    Sexually Abused: No  Never exposed to TB to his knowledge Originally from [place], has lived here for 35 yrs.  He works in a [employer]   No Known Allergies   Outpatient Medications Prior to Visit  Medication Sig Dispense Refill   albuterol (VENTOLIN HFA) 108 (90 Base) MCG/ACT inhaler Inhale 2 puffs into the lungs every 6 (six) hours as needed for wheezing or shortness of breath. 8 g 6   aspirin 81 MG EC tablet Take 1 tablet (81 mg total) by mouth daily. 30 tablet 3   Fluticasone-Umeclidin-Vilant (TRELEGY ELLIPTA) 100-62.5-25 MCG/ACT AEPB Inhale 2 puffs into the lungs daily.     ibuprofen (ADVIL) 200 MG tablet Take 600 mg by mouth every 6 (six) hours as needed  for mild pain.     ipratropium-albuterol (DUONEB) 0.5-2.5 (3) MG/3ML SOLN Take 3 mLs by nebulization every 6 (six) hours as needed (for shortness of breath or wheezing). 75 mL 3   metFORMIN (GLUCOPHAGE) 500 MG tablet Take 2 (two) times daily with a meal by mouth.     TRULICITY 0.75 MG/0.5ML SOPN Inject 0.75 mg into the skin once a week. Wednesday     fluticasone-salmeterol (ADVAIR DISKUS) 250-50 MCG/ACT AEPB Inhale 1 puff into the lungs 2 (two) times daily. (Patient not taking: Reported on 09/10/2023) 60 each 12   guaiFENesin (MUCINEX) 600 MG 12 hr tablet Take 2 tablets (1,200 mg total) by mouth 2 (two) times daily as needed for to loosen phlegm or cough. (Patient not taking: Reported on 09/10/2023) 45 tablet 1   INCRUSE ELLIPTA 62.5 MCG/ACT AEPB INHALE 1 PUFF INTO THE LUNGS DAILY (Patient not taking: Reported on 09/10/2023) 30 each 2   rosuvastatin (CRESTOR) 20 MG tablet Take 1 tablet (20 mg total) by mouth daily. 30 tablet 11   tamsulosin (FLOMAX) 0.4 MG CAPS capsule Take 0.4 mg by mouth daily. (Patient not taking: Reported on 09/10/2023)     No facility-administered medications prior to visit.         Objective:   Physical Exam Vitals:   09/10/23 1432  BP: 136/84  Pulse: (!) 107   SpO2: 96%  Weight: (!) 312 lb (141.5 kg)  Height: 6' (1.829 m)    Gen: Pleasant, obese, in no distress,  normal affect  ENT: No lesions,  mouth clear,  oropharynx clear, no postnasal drip  Neck: No JVD, no stridor  Lungs: No use of accessory muscles, no wheezes  Cardiovascular: RRR, heart sounds normal, no murmur or gallops, no peripheral edema  Musculoskeletal: His proximal right thumb is swollen and tender.  No erythema.  He has some difficulty flexing  Neuro: alert, non focal  Skin: Warm, no lesions or rashes       Assessment & Plan:  COPD (chronic obstructive pulmonary disease) Agree with changing over to Trelegy 1 inhalation once daily after your increased runs out.  Remember to rinse and gargle after use the Trelegy Keep your albuterol available to use either 2 puffs or 1 nebulizer treatment up to every 4 hours if needed for shortness of breath, chest tightness, wheezing. Follow with Dr. Delton Coombes in 6 months, sooner if you have any problems.  Acute respiratory failure with hypoxia and hypercapnia (HCC) Continue your oxygen at 2 L/min when you exert yourself  OSA (obstructive sleep apnea) He has not used his CPAP machine in a couple years.  He is interested in trying to get back on it but he is concerned that the machine is not working appropriately.  He is going to retry it but if the machine malfunctions then we will need to get him a new one.  He will probably need a home sleep study in order to qualify for a new device.   Levy Pupa, MD, PhD 09/10/2023, 2:51 PM [facility] [phone] or if no answer [phone]

## 2023-09-10 NOTE — Patient Instructions (Addendum)
Agree with changing over to Trelegy 1 inhalation once daily after your increased runs out.  Remember to rinse and gargle after use the Trelegy Keep your albuterol available to use either 2 puffs or 1 nebulizer treatment up to every 4 hours if needed for shortness of breath, chest tightness, wheezing. Continue her oxygen at 2 L/min when you exert yourself You would benefit from getting back on your CPAP.  Try to restart it.  If you note that the machine is not functioning properly then we will try to get you a new one.  We will probably need to repeat your home sleep study in order to get a new machine for you. Follow with Dr. Delton Coombes in 6 months, sooner if you have any problems.

## 2023-09-10 NOTE — Assessment & Plan Note (Signed)
Agree with changing over to Trelegy 1 inhalation once daily after your increased runs out.  Remember to rinse and gargle after use the Trelegy Keep your albuterol available to use either 2 puffs or 1 nebulizer treatment up to every 4 hours if needed for shortness of breath, chest tightness, wheezing. Follow with Dr. Delton Coombes in 6 months, sooner if you have any problems.

## 2023-09-10 NOTE — Assessment & Plan Note (Signed)
Continue your oxygen at 2 L/min when you exert yourself

## 2023-09-30 DIAGNOSIS — E1165 Type 2 diabetes mellitus with hyperglycemia: Secondary | ICD-10-CM | POA: Diagnosis not present

## 2023-09-30 DIAGNOSIS — K921 Melena: Secondary | ICD-10-CM | POA: Diagnosis not present

## 2023-09-30 DIAGNOSIS — E785 Hyperlipidemia, unspecified: Secondary | ICD-10-CM | POA: Diagnosis not present

## 2023-09-30 DIAGNOSIS — J449 Chronic obstructive pulmonary disease, unspecified: Secondary | ICD-10-CM | POA: Diagnosis not present

## 2023-10-01 DIAGNOSIS — E1165 Type 2 diabetes mellitus with hyperglycemia: Secondary | ICD-10-CM | POA: Diagnosis not present

## 2023-12-23 DIAGNOSIS — N400 Enlarged prostate without lower urinary tract symptoms: Secondary | ICD-10-CM | POA: Diagnosis not present

## 2023-12-23 DIAGNOSIS — J441 Chronic obstructive pulmonary disease with (acute) exacerbation: Secondary | ICD-10-CM | POA: Diagnosis not present

## 2023-12-23 DIAGNOSIS — E782 Mixed hyperlipidemia: Secondary | ICD-10-CM | POA: Diagnosis not present

## 2023-12-23 DIAGNOSIS — E1165 Type 2 diabetes mellitus with hyperglycemia: Secondary | ICD-10-CM | POA: Diagnosis not present

## 2024-01-14 DIAGNOSIS — J441 Chronic obstructive pulmonary disease with (acute) exacerbation: Secondary | ICD-10-CM | POA: Diagnosis not present

## 2024-01-14 DIAGNOSIS — E782 Mixed hyperlipidemia: Secondary | ICD-10-CM | POA: Diagnosis not present

## 2024-01-14 DIAGNOSIS — E1165 Type 2 diabetes mellitus with hyperglycemia: Secondary | ICD-10-CM | POA: Diagnosis not present

## 2024-01-14 DIAGNOSIS — N400 Enlarged prostate without lower urinary tract symptoms: Secondary | ICD-10-CM | POA: Diagnosis not present

## 2024-01-14 DIAGNOSIS — Z6841 Body Mass Index (BMI) 40.0 and over, adult: Secondary | ICD-10-CM | POA: Diagnosis not present

## 2024-01-23 DIAGNOSIS — N400 Enlarged prostate without lower urinary tract symptoms: Secondary | ICD-10-CM | POA: Diagnosis not present

## 2024-01-23 DIAGNOSIS — E1165 Type 2 diabetes mellitus with hyperglycemia: Secondary | ICD-10-CM | POA: Diagnosis not present

## 2024-01-23 DIAGNOSIS — E782 Mixed hyperlipidemia: Secondary | ICD-10-CM | POA: Diagnosis not present

## 2024-01-23 DIAGNOSIS — J441 Chronic obstructive pulmonary disease with (acute) exacerbation: Secondary | ICD-10-CM | POA: Diagnosis not present

## 2024-01-25 DIAGNOSIS — R3915 Urgency of urination: Secondary | ICD-10-CM | POA: Diagnosis not present

## 2024-01-25 DIAGNOSIS — Z125 Encounter for screening for malignant neoplasm of prostate: Secondary | ICD-10-CM | POA: Diagnosis not present

## 2024-01-25 DIAGNOSIS — Q6100 Congenital renal cyst, unspecified: Secondary | ICD-10-CM | POA: Diagnosis not present

## 2024-01-25 DIAGNOSIS — R310 Gross hematuria: Secondary | ICD-10-CM | POA: Diagnosis not present

## 2024-02-02 ENCOUNTER — Other Ambulatory Visit (HOSPITAL_BASED_OUTPATIENT_CLINIC_OR_DEPARTMENT_OTHER): Payer: Self-pay

## 2024-02-02 DIAGNOSIS — K760 Fatty (change of) liver, not elsewhere classified: Secondary | ICD-10-CM | POA: Diagnosis not present

## 2024-02-02 DIAGNOSIS — K31A19 Gastric intestinal metaplasia without dysplasia, unspecified site: Secondary | ICD-10-CM | POA: Diagnosis not present

## 2024-02-02 DIAGNOSIS — K921 Melena: Secondary | ICD-10-CM | POA: Diagnosis not present

## 2024-02-02 DIAGNOSIS — K8020 Calculus of gallbladder without cholecystitis without obstruction: Secondary | ICD-10-CM | POA: Diagnosis not present

## 2024-02-02 DIAGNOSIS — R109 Unspecified abdominal pain: Secondary | ICD-10-CM | POA: Diagnosis not present

## 2024-02-02 DIAGNOSIS — K5900 Constipation, unspecified: Secondary | ICD-10-CM | POA: Diagnosis not present

## 2024-02-02 DIAGNOSIS — Z800 Family history of malignant neoplasm of digestive organs: Secondary | ICD-10-CM | POA: Diagnosis not present

## 2024-02-09 ENCOUNTER — Ambulatory Visit (HOSPITAL_BASED_OUTPATIENT_CLINIC_OR_DEPARTMENT_OTHER)
Admission: RE | Admit: 2024-02-09 | Discharge: 2024-02-09 | Disposition: A | Discharge status: Home / Self Care | Source: Ambulatory Visit

## 2024-02-09 DIAGNOSIS — K760 Fatty (change of) liver, not elsewhere classified: Secondary | ICD-10-CM | POA: Insufficient documentation

## 2024-02-09 DIAGNOSIS — K759 Inflammatory liver disease, unspecified: Secondary | ICD-10-CM | POA: Diagnosis not present

## 2024-02-09 DIAGNOSIS — K709 Alcoholic liver disease, unspecified: Secondary | ICD-10-CM | POA: Diagnosis not present

## 2024-02-09 DIAGNOSIS — R932 Abnormal findings on diagnostic imaging of liver and biliary tract: Secondary | ICD-10-CM | POA: Diagnosis not present

## 2024-02-11 DIAGNOSIS — E1165 Type 2 diabetes mellitus with hyperglycemia: Secondary | ICD-10-CM | POA: Diagnosis not present

## 2024-03-03 DIAGNOSIS — D124 Benign neoplasm of descending colon: Secondary | ICD-10-CM | POA: Diagnosis not present

## 2024-03-03 DIAGNOSIS — R1084 Generalized abdominal pain: Secondary | ICD-10-CM | POA: Diagnosis not present

## 2024-03-03 DIAGNOSIS — K648 Other hemorrhoids: Secondary | ICD-10-CM | POA: Diagnosis not present

## 2024-03-03 DIAGNOSIS — K2970 Gastritis, unspecified, without bleeding: Secondary | ICD-10-CM | POA: Diagnosis not present

## 2024-03-03 DIAGNOSIS — Z800 Family history of malignant neoplasm of digestive organs: Secondary | ICD-10-CM | POA: Diagnosis not present

## 2024-03-03 DIAGNOSIS — K3189 Other diseases of stomach and duodenum: Secondary | ICD-10-CM | POA: Diagnosis not present

## 2024-03-03 DIAGNOSIS — K921 Melena: Secondary | ICD-10-CM | POA: Diagnosis not present

## 2024-03-07 DIAGNOSIS — D124 Benign neoplasm of descending colon: Secondary | ICD-10-CM | POA: Diagnosis not present

## 2024-03-07 DIAGNOSIS — K3189 Other diseases of stomach and duodenum: Secondary | ICD-10-CM | POA: Diagnosis not present

## 2024-04-25 ENCOUNTER — Emergency Department (HOSPITAL_COMMUNITY)

## 2024-04-25 ENCOUNTER — Inpatient Hospital Stay (HOSPITAL_COMMUNITY)
Admission: EM | Admit: 2024-04-25 | Discharge: 2024-04-27 | DRG: 190 | Disposition: A | Discharge status: Home / Self Care | Attending: Family Medicine | Admitting: Family Medicine

## 2024-04-25 ENCOUNTER — Encounter (HOSPITAL_COMMUNITY): Payer: Self-pay

## 2024-04-25 DIAGNOSIS — R590 Localized enlarged lymph nodes: Secondary | ICD-10-CM | POA: Diagnosis not present

## 2024-04-25 DIAGNOSIS — Z7985 Long-term (current) use of injectable non-insulin antidiabetic drugs: Secondary | ICD-10-CM | POA: Diagnosis not present

## 2024-04-25 DIAGNOSIS — D696 Thrombocytopenia, unspecified: Secondary | ICD-10-CM | POA: Diagnosis present

## 2024-04-25 DIAGNOSIS — Z7982 Long term (current) use of aspirin: Secondary | ICD-10-CM | POA: Diagnosis not present

## 2024-04-25 DIAGNOSIS — Z7984 Long term (current) use of oral hypoglycemic drugs: Secondary | ICD-10-CM | POA: Diagnosis not present

## 2024-04-25 DIAGNOSIS — Z809 Family history of malignant neoplasm, unspecified: Secondary | ICD-10-CM

## 2024-04-25 DIAGNOSIS — E785 Hyperlipidemia, unspecified: Secondary | ICD-10-CM | POA: Diagnosis present

## 2024-04-25 DIAGNOSIS — J9621 Acute and chronic respiratory failure with hypoxia: Secondary | ICD-10-CM | POA: Diagnosis present

## 2024-04-25 DIAGNOSIS — J40 Bronchitis, not specified as acute or chronic: Secondary | ICD-10-CM | POA: Diagnosis not present

## 2024-04-25 DIAGNOSIS — Z89021 Acquired absence of right finger(s): Secondary | ICD-10-CM | POA: Diagnosis not present

## 2024-04-25 DIAGNOSIS — Z800 Family history of malignant neoplasm of digestive organs: Secondary | ICD-10-CM | POA: Diagnosis not present

## 2024-04-25 DIAGNOSIS — Z9981 Dependence on supplemental oxygen: Secondary | ICD-10-CM | POA: Diagnosis not present

## 2024-04-25 DIAGNOSIS — G4733 Obstructive sleep apnea (adult) (pediatric): Secondary | ICD-10-CM | POA: Diagnosis present

## 2024-04-25 DIAGNOSIS — I517 Cardiomegaly: Secondary | ICD-10-CM | POA: Diagnosis not present

## 2024-04-25 DIAGNOSIS — Z91199 Patient's noncompliance with other medical treatment and regimen due to unspecified reason: Secondary | ICD-10-CM

## 2024-04-25 DIAGNOSIS — Z1152 Encounter for screening for COVID-19: Secondary | ICD-10-CM

## 2024-04-25 DIAGNOSIS — Z86718 Personal history of other venous thrombosis and embolism: Secondary | ICD-10-CM

## 2024-04-25 DIAGNOSIS — E1165 Type 2 diabetes mellitus with hyperglycemia: Secondary | ICD-10-CM | POA: Diagnosis not present

## 2024-04-25 DIAGNOSIS — Z8701 Personal history of pneumonia (recurrent): Secondary | ICD-10-CM

## 2024-04-25 DIAGNOSIS — R000 Tachycardia, unspecified: Secondary | ICD-10-CM | POA: Diagnosis present

## 2024-04-25 DIAGNOSIS — R0682 Tachypnea, not elsewhere classified: Secondary | ICD-10-CM | POA: Diagnosis not present

## 2024-04-25 DIAGNOSIS — Z801 Family history of malignant neoplasm of trachea, bronchus and lung: Secondary | ICD-10-CM

## 2024-04-25 DIAGNOSIS — Z87891 Personal history of nicotine dependence: Secondary | ICD-10-CM

## 2024-04-25 DIAGNOSIS — J9601 Acute respiratory failure with hypoxia: Secondary | ICD-10-CM

## 2024-04-25 DIAGNOSIS — Z79899 Other long term (current) drug therapy: Secondary | ICD-10-CM

## 2024-04-25 DIAGNOSIS — E119 Type 2 diabetes mellitus without complications: Secondary | ICD-10-CM

## 2024-04-25 DIAGNOSIS — R0602 Shortness of breath: Secondary | ICD-10-CM | POA: Diagnosis not present

## 2024-04-25 DIAGNOSIS — Z7951 Long term (current) use of inhaled steroids: Secondary | ICD-10-CM

## 2024-04-25 DIAGNOSIS — R0902 Hypoxemia: Secondary | ICD-10-CM | POA: Diagnosis not present

## 2024-04-25 DIAGNOSIS — J441 Chronic obstructive pulmonary disease with (acute) exacerbation: Secondary | ICD-10-CM | POA: Diagnosis not present

## 2024-04-25 DIAGNOSIS — R918 Other nonspecific abnormal finding of lung field: Secondary | ICD-10-CM | POA: Diagnosis not present

## 2024-04-25 DIAGNOSIS — R0981 Nasal congestion: Secondary | ICD-10-CM | POA: Diagnosis not present

## 2024-04-25 DIAGNOSIS — R0989 Other specified symptoms and signs involving the circulatory and respiratory systems: Secondary | ICD-10-CM | POA: Diagnosis not present

## 2024-04-25 DIAGNOSIS — R051 Acute cough: Secondary | ICD-10-CM | POA: Diagnosis not present

## 2024-04-25 DIAGNOSIS — I10 Essential (primary) hypertension: Secondary | ICD-10-CM | POA: Diagnosis present

## 2024-04-25 DIAGNOSIS — I4891 Unspecified atrial fibrillation: Secondary | ICD-10-CM | POA: Diagnosis not present

## 2024-04-25 LAB — CBC WITH DIFFERENTIAL/PLATELET
Abs Immature Granulocytes: 0.06 K/uL (ref 0.00–0.07)
Basophils Absolute: 0.1 K/uL (ref 0.0–0.1)
Basophils Relative: 1 %
Eosinophils Absolute: 0 K/uL (ref 0.0–0.5)
Eosinophils Relative: 0 %
HCT: 47.5 % (ref 39.0–52.0)
Hemoglobin: 14.9 g/dL (ref 13.0–17.0)
Immature Granulocytes: 1 %
Lymphocytes Relative: 14 %
Lymphs Abs: 1.5 K/uL (ref 0.7–4.0)
MCH: 28.1 pg (ref 26.0–34.0)
MCHC: 31.4 g/dL (ref 30.0–36.0)
MCV: 89.5 fL (ref 80.0–100.0)
Monocytes Absolute: 1 K/uL (ref 0.1–1.0)
Monocytes Relative: 10 %
Neutro Abs: 7.8 K/uL — ABNORMAL HIGH (ref 1.7–7.7)
Neutrophils Relative %: 74 %
Platelets: 145 K/uL — ABNORMAL LOW (ref 150–400)
RBC: 5.31 MIL/uL (ref 4.22–5.81)
RDW: 14.6 % (ref 11.5–15.5)
WBC: 10.5 K/uL (ref 4.0–10.5)
nRBC: 0 % (ref 0.0–0.2)

## 2024-04-25 LAB — TROPONIN I (HIGH SENSITIVITY)
Troponin I (High Sensitivity): 11 ng/L (ref <18)
Troponin I (High Sensitivity): 10 ng/L (ref <18)

## 2024-04-25 LAB — BASIC METABOLIC PANEL WITH GFR
Anion gap: 13 (ref 5–15)
BUN: 19 mg/dL (ref 8–23)
CO2: 29 mmol/L (ref 22–32)
Calcium: 8.8 mg/dL — ABNORMAL LOW (ref 8.9–10.3)
Chloride: 96 mmol/L — ABNORMAL LOW (ref 98–111)
Creatinine, Ser: 1.48 mg/dL — ABNORMAL HIGH (ref 0.61–1.24)
GFR, Estimated: 50 mL/min — ABNORMAL LOW (ref >60)
Glucose, Bld: 219 mg/dL — ABNORMAL HIGH (ref 70–99)
Potassium: 4.3 mmol/L (ref 3.5–5.1)
Sodium: 138 mmol/L (ref 135–145)

## 2024-04-25 LAB — RESP PANEL BY RT-PCR (RSV, FLU A&B, COVID)  RVPGX2
Influenza A by PCR: NEGATIVE
Influenza B by PCR: NEGATIVE
Resp Syncytial Virus by PCR: NEGATIVE
SARS Coronavirus 2 by RT PCR: NEGATIVE

## 2024-04-25 LAB — BRAIN NATRIURETIC PEPTIDE: B Natriuretic Peptide: 26.5 pg/mL (ref 0.0–100.0)

## 2024-04-25 LAB — HEMOGLOBIN A1C
Hgb A1c MFr Bld: 7.5 % — ABNORMAL HIGH (ref 4.8–5.6)
Mean Plasma Glucose: 168.55 mg/dL

## 2024-04-25 LAB — GLUCOSE, CAPILLARY: Glucose-Capillary: 317 mg/dL — ABNORMAL HIGH (ref 70–99)

## 2024-04-25 MED ORDER — SODIUM CHLORIDE 0.9 % IV SOLN
500.0000 mg | Freq: Once | INTRAVENOUS | Status: DC
  Filled 2024-04-25: qty 5

## 2024-04-25 MED ORDER — BUDESON-GLYCOPYRROL-FORMOTEROL 160-9-4.8 MCG/ACT IN AERO: 2.0000 | INHALATION_SPRAY | Freq: Two times a day (BID) | RESPIRATORY_TRACT | Status: DC

## 2024-04-25 MED ORDER — LACTATED RINGERS IV BOLUS
500.0000 mL | Freq: Once | INTRAVENOUS | Status: AC
  Administered 2024-04-25: 500 mL via INTRAVENOUS

## 2024-04-25 MED ORDER — METHYLPREDNISOLONE SODIUM SUCC 125 MG IJ SOLR
125.0000 mg | Freq: Once | INTRAMUSCULAR | Status: AC
  Administered 2024-04-25: 125 mg via INTRAVENOUS
  Filled 2024-04-25: qty 2

## 2024-04-25 MED ORDER — SODIUM CHLORIDE 0.9 % IV SOLN
500.0000 mg | Freq: Once | INTRAVENOUS | Status: AC
  Administered 2024-04-25: 500 mg via INTRAVENOUS

## 2024-04-25 MED ORDER — INSULIN ASPART 100 UNIT/ML IJ SOLN: 0.0000 [IU] | Freq: Three times a day (TID) | INTRAMUSCULAR | Status: DC

## 2024-04-25 MED ORDER — IBUPROFEN 200 MG PO TABS: 600.0000 mg | ORAL_TABLET | Freq: Four times a day (QID) | ORAL | Status: DC | PRN

## 2024-04-25 MED ORDER — IPRATROPIUM-ALBUTEROL 0.5-2.5 (3) MG/3ML IN SOLN: 3.0000 mL | Freq: Four times a day (QID) | RESPIRATORY_TRACT | Status: DC

## 2024-04-25 MED ORDER — SODIUM CHLORIDE 0.9 % IV SOLN
1.0000 g | Freq: Once | INTRAVENOUS | Status: AC
  Administered 2024-04-25: 1 g via INTRAVENOUS
  Filled 2024-04-25: qty 10

## 2024-04-25 MED ORDER — PREDNISONE 10 MG PO TABS: 40.0000 mg | ORAL_TABLET | Freq: Every day | ORAL | 0 refills | Status: DC

## 2024-04-25 MED ORDER — AZITHROMYCIN 500 MG PO TABS
500.0000 mg | ORAL_TABLET | Freq: Every day | ORAL | Status: AC
  Administered 2024-04-26 – 2024-04-27 (×2): 500 mg via ORAL
  Filled 2024-04-25 (×2): qty 1

## 2024-04-25 MED ORDER — IPRATROPIUM-ALBUTEROL 0.5-2.5 (3) MG/3ML IN SOLN
3.0000 mL | RESPIRATORY_TRACT | Status: DC
  Administered 2024-04-25 – 2024-04-26 (×5): 3 mL via RESPIRATORY_TRACT
  Filled 2024-04-25 (×5): qty 3

## 2024-04-25 MED ORDER — INSULIN ASPART 100 UNIT/ML IJ SOLN
0.0000 [IU] | Freq: Three times a day (TID) | INTRAMUSCULAR | Status: DC
  Administered 2024-04-25: 11 [IU] via SUBCUTANEOUS
  Administered 2024-04-26: 8 [IU] via SUBCUTANEOUS
  Administered 2024-04-26: 11 [IU] via SUBCUTANEOUS
  Administered 2024-04-26: 15 [IU] via SUBCUTANEOUS
  Administered 2024-04-27: 5 [IU] via SUBCUTANEOUS

## 2024-04-25 MED ORDER — IPRATROPIUM-ALBUTEROL 0.5-2.5 (3) MG/3ML IN SOLN
3.0000 mL | Freq: Once | RESPIRATORY_TRACT | Status: AC
  Administered 2024-04-25: 3 mL via RESPIRATORY_TRACT
  Filled 2024-04-25: qty 30
  Filled 2024-04-25: qty 3

## 2024-04-25 MED ORDER — AZITHROMYCIN 250 MG PO TABS: 500.0000 mg | ORAL_TABLET | Freq: Once | ORAL | Status: DC

## 2024-04-25 MED ORDER — AZITHROMYCIN 250 MG PO TABS: 500.0000 mg | ORAL_TABLET | Freq: Every day | ORAL | 0 refills | Status: DC

## 2024-04-25 MED ORDER — PREDNISONE 20 MG PO TABS
40.0000 mg | ORAL_TABLET | Freq: Every day | ORAL | Status: DC
  Administered 2024-04-26 – 2024-04-27 (×2): 40 mg via ORAL
  Filled 2024-04-25 (×2): qty 2

## 2024-04-25 MED ORDER — SODIUM CHLORIDE 0.9 % IV SOLN
1.0000 g | INTRAVENOUS | Status: DC
  Administered 2024-04-26: 1 g via INTRAVENOUS
  Filled 2024-04-25: qty 10

## 2024-04-25 NOTE — Assessment & Plan Note (Addendum)
 Previous A1c 9.1.  Will repeat. -Start moderate sliding scale insulin  given he is insulin  nave outpatient and adjust as needed with his dosing of steroids -Hold metformin , Trulicity while inpatient

## 2024-04-25 NOTE — ED Notes (Signed)
 Received notification from CCMD that patient HR was elevated. Pt sitting on the side of the bed. Sweating and pale/gray. Pt short of breath. Manuel Duwaine CROME, DO bedside to access patient.

## 2024-04-25 NOTE — ED Notes (Signed)
 CCMD called to place pt on monitor

## 2024-04-25 NOTE — ED Provider Notes (Addendum)
 [place] EMERGENCY DEPARTMENT AT [facility] Provider Note   CSN: [id number] Arrival date & time: 04/25/24  1230     Patient presents with: Shortness of Breath   Manuel Leonard is a 73 y.o. male.   73 year old male presents for evaluation of shortness of breath.  He was sent by urgent care for hypoxia.  Supposed be on 2 L nasal cannula at home but does not wear it.  States he has a history of COPD and he has been using his inhalers without much relief.  He does also state that he has had increase in his coughing frequency but no fevers.  Denies any other symptoms or concerns at this time.   Shortness of Breath Associated symptoms: cough   Associated symptoms: no abdominal pain, no chest pain, no ear pain, no fever, no rash, no sore throat and no vomiting        Prior to Admission medications   Medication Sig Start Date End Date Taking? Authorizing Provider  azithromycin  (ZITHROMAX ) 250 MG tablet Take 2 tablets (500 mg total) by mouth daily for 3 days. Take first 2 tablets together, then 1 every day until finished. 04/25/24 04/28/24 Yes Kaycee Haycraft L, DO  predniSONE  (DELTASONE ) 10 MG tablet Take 4 tablets (40 mg total) by mouth daily for 4 days. 04/25/24 04/29/24 Yes Jamiyla Ishee L, DO  albuterol  (VENTOLIN  HFA) 108 (90 Base) MCG/ACT inhaler Inhale 2 puffs into the lungs every 6 (six) hours as needed for wheezing or shortness of breath. 12/06/21   Shelah Lamar RAMAN, MD  aspirin  81 MG EC tablet Take 1 tablet (81 mg total) by mouth daily. 01/22/18   Monetta Redell PARAS, MD  fluticasone -salmeterol (ADVAIR  DISKUS) 250-50 MCG/ACT AEPB Inhale 1 puff into the lungs 2 (two) times daily. Patient not taking: Reported on 09/10/2023 07/02/23   Raenelle Donalda HERO, MD  Fluticasone -Umeclidin-Vilant (TRELEGY ELLIPTA ) 100-62.5-25 MCG/ACT AEPB Inhale 2 puffs into the lungs daily. 07/09/23   Hope Almarie ORN, NP  guaiFENesin  (MUCINEX ) 600 MG 12 hr tablet Take 2 tablets (1,200 mg total) by mouth 2  (two) times daily as needed for to loosen phlegm or cough. Patient not taking: Reported on 09/10/2023 07/09/23   Hope Almarie ORN, NP  ibuprofen  (ADVIL ) 200 MG tablet Take 600 mg by mouth every 6 (six) hours as needed for mild pain.    [provider]  INCRUSE ELLIPTA  62.5 MCG/ACT AEPB INHALE 1 PUFF INTO THE LUNGS DAILY Patient not taking: Reported on 09/10/2023 07/31/23   Shelah Lamar RAMAN, MD  ipratropium-albuterol  (DUONEB) 0.5-2.5 (3) MG/3ML SOLN Take 3 mLs by nebulization every 6 (six) hours as needed (for shortness of breath or wheezing). 07/30/21   Cobb, Comer GAILS, NP  metFORMIN  (GLUCOPHAGE ) 500 MG tablet Take 2 (two) times daily with a meal by mouth.    [provider]  rosuvastatin  (CRESTOR ) 20 MG tablet Take 1 tablet (20 mg total) by mouth daily. 07/08/21 07/09/23  Brimage, Vondra, DO  tamsulosin  (FLOMAX ) 0.4 MG CAPS capsule Take 0.4 mg by mouth daily. Patient not taking: Reported on 09/10/2023 06/09/23   [provider]  TRULICITY 0.75 MG/0.5ML SOPN Inject 0.75 mg into the skin once a week. Wednesday 06/01/21   [provider]    Allergies: Patient has no known allergies.    Review of Systems  Constitutional:  Negative for chills and fever.  HENT:  Negative for ear pain and sore throat.   Eyes:  Negative for pain and visual disturbance.  Respiratory:  Positive for cough and shortness of breath.   Cardiovascular:  Negative for chest pain and palpitations.  Gastrointestinal:  Negative for abdominal pain and vomiting.  Genitourinary:  Negative for dysuria and hematuria.  Musculoskeletal:  Negative for arthralgias and back pain.  Skin:  Negative for color change and rash.  Neurological:  Negative for seizures and syncope.  All other systems reviewed and are negative.   Updated Vital Signs BP (!) 153/78   Pulse (!) 110   Temp 98.4 F (36.9 C) (Oral)   Resp 20   SpO2 96%   Physical Exam Vitals and nursing note reviewed.  Constitutional:       General: He is not in acute distress.    Appearance: He is well-developed.  HENT:     Head: Normocephalic and atraumatic.  Eyes:     Conjunctiva/sclera: Conjunctivae normal.  Cardiovascular:     Rate and Rhythm: Regular rhythm. Tachycardia present.     Heart sounds: No murmur heard. Pulmonary:     Effort: Pulmonary effort is normal. No respiratory distress.     Breath sounds: Decreased breath sounds and wheezing present.  Abdominal:     Palpations: Abdomen is soft.     Tenderness: There is no abdominal tenderness.  Musculoskeletal:        General: No swelling.     Cervical back: Neck supple.  Skin:    General: Skin is warm and dry.     Capillary Refill: Capillary refill takes less than 2 seconds.  Neurological:     Mental Status: He is alert.  Psychiatric:        Mood and Affect: Mood normal.     (all labs ordered are listed, but only abnormal results are displayed) Labs Reviewed  BASIC METABOLIC PANEL WITH GFR - Abnormal; Notable for the following components:      Result Value   Chloride 96 (*)    Glucose, Bld 219 (*)    Creatinine, Ser 1.48 (*)    Calcium  8.8 (*)    GFR, Estimated 50 (*)    All other components within normal limits  CBC WITH DIFFERENTIAL/PLATELET - Abnormal; Notable for the following components:   Platelets 145 (*)    Neutro Abs 7.8 (*)    All other components within normal limits  RESP PANEL BY RT-PCR (RSV, FLU A&B, COVID)  [id number]  BRAIN NATRIURETIC PEPTIDE  TROPONIN I (HIGH SENSITIVITY)  TROPONIN I (HIGH SENSITIVITY)    EKG: EKG Interpretation Date/Time:  Monday April 25 2024 12:37:16 EDT Ventricular Rate:  118 PR Interval:  175 QRS Duration:  98 QT Interval:  309 QTC Calculation: 433 R Axis:   -65  Text Interpretation: Sinus tachycardia Left anterior fascicular block Low voltage, precordial leads Abnormal R-wave progression, early transition Nonspecific ST abnormalities in anterior and inferior leads Compared with prior EKG from  06/29/2023 Confirmed by Gennaro Bouchard ([id number]) on 04/25/2024 12:45:17 PM  Radiology: CT Chest Wo Contrast Result Date: 04/25/2024 CLINICAL DATA:  cough, sob EXAM: CT CHEST WITHOUT CONTRAST TECHNIQUE: Multidetector CT imaging of the chest was performed following the standard protocol without IV contrast. RADIATION DOSE REDUCTION: This exam was performed according to the departmental dose-optimization program which includes automated exposure control, adjustment of the mA and/or kV according to patient size and/or use of iterative reconstruction technique. COMPARISON:  Chest x-ray 04/25/2024, CT heart 01/09/2022, CT angio chest 06/30/2021 FINDINGS: Cardiovascular: Normal heart size. No significant pericardial effusion. The thoracic aorta is normal in caliber. Mild atherosclerotic plaque of the  thoracic aorta. Patent. Severe coronary artery calcifications. Mediastinum/Nodes: Enlarged prevascular lymph node measuring 1.1 cm (3:37). No gross hilar adenopathy, noting limited sensitivity for the detection of hilar adenopathy on this noncontrast study. No axillary lymphadenopathy. Thyroid  gland, trachea, and esophagus demonstrate no significant findings. Lungs/Pleura: Biapical emphysematous changes. Diffuse bronchial wall thickening. Mild mucous plugging within the bilateral lower lobes. Right upper lobe subpleural 1.5 x 1 cm ground-glass airspace opacity. Bilateral lower lobe atelectasis. No pulmonary nodule. No pulmonary mass. No pleural effusion. No pneumothorax. Upper Abdomen: Cholelithiasis. Fluid density lesions of the kidneys likely represent simple renal cysts. Musculoskeletal: No chest wall abnormality. No suspicious lytic or blastic osseous lesions. No acute displaced fracture. IMPRESSION: 1. Diffuse bronchial wall thickening with mild mucous plugging within the bilateral lower lobes. Findings suggestive of infection/inflammation. 2. 1.1 cm mediastinal lymph node likely reactive in etiology. No gross hilar  adenopathy, noting limited sensitivity for the detection of hilar adenopathy on this noncontrast study. Recommend attention on follow-up. 3. Right upper lobe subpleural 1.5 x 1 cm ground-glass airspace opacity. Initial follow-up with CT at 6 months is recommended to confirm persistence. If persistent, repeat CT is recommended every 2 years until 5 years of stability has been established. This recommendation follows the consensus statement: Guidelines for Management of Incidental Pulmonary Nodules Detected on CT Images: From the Fleischner Society 2017; Radiology 2017; 284:228-243. 4. Cholelithiasis. 5.  Aortic Atherosclerosis (ICD10-I70.0). Electronically Signed   By: Morgane  Naveau M.D.   On: 04/25/2024 15:18   DG Chest 1 View Result Date: 04/25/2024 CLINICAL DATA:  Shortness of breath. EXAM: CHEST  1 VIEW COMPARISON:  08/06/2023. FINDINGS: The heart is enlarged and the mediastinal contour is within normal limits. Lung volumes are low in there is chronic elevation of the left diaphragm. Mild airspace disease is noted at the lung bases. No effusion or pneumothorax. No acute osseous abnormality. IMPRESSION: Low lung volumes with atelectasis or infiltrate at the lung bases. Electronically Signed   By: Leita Birmingham M.D.   On: 04/25/2024 14:04     Procedures   Medications Ordered in the ED  azithromycin  (ZITHROMAX ) 500 mg in sodium chloride  0.9 % 250 mL IVPB (500 mg Intravenous New Bag/Given 04/25/24 1608)  methylPREDNISolone  sodium succinate (SOLU-MEDROL ) 125 mg/2 mL injection 125 mg (125 mg Intravenous Given 04/25/24 1312)  ipratropium-albuterol  (DUONEB) 0.5-2.5 (3) MG/3ML nebulizer solution 3 mL (3 mLs Nebulization Given 04/25/24 1312)  cefTRIAXone  (ROCEPHIN ) 1 g in sodium chloride  0.9 % 100 mL IVPB (0 g Intravenous Stopped 04/25/24 1604)                                    Medical Decision Making Cardiac monitor interpretation: Sinus tachycardia, no ectopy  Social determinants of health: Patient is  noncompliant with his home  Patient arrived hypoxic at 84% on room air with some tachypnea.  Oxygen  was increased to 4 L which immediately improved his saturations and symptoms.  He is tachycardic as well.  Was given some IV fluids as well as Solu-Medrol  and a breathing treatment.  Patient decreased to 2 L and feeling much better.  He has no evidence of pneumonia on his chest x-ray and CT scan but does have evidence of bronchitis.  I gave him a dose of Rocephin  and azithromycin .  I was going to discharge him home as he wanted to leave was feeling better but he got up and got diaphoretic and tachycardic and  sweaty and gray.  We put him back in the bed and slightly increase his oxygen .  Patient's case discussed with family medicine resident and patient will be admitted for further workup and management.  Patient and family at bedside are agreeable with the plan.  Problems Addressed: Acute hypoxic respiratory failure (HCC): acute illness or injury that poses a threat to life or bodily functions Bronchitis: undiagnosed new problem with uncertain prognosis COPD exacerbation (HCC): acute illness or injury that poses a threat to life or bodily functions Tachycardia: undiagnosed new problem with uncertain prognosis  Amount and/or Complexity of Data Reviewed External Data Reviewed: notes.    Details: Prior outpatient records reviewed and patient follows up with primary care for diabetes Labs: ordered. Decision-making details documented in ED Course.    Details: Ordered and reviewed by me and fairly unremarkable, troponin negative and there is no leukocytosis Radiology: ordered and independent interpretation performed. Decision-making details documented in ED Course.    Details: Chest x-ray ordered and interpreted by me independently of radiology and shows infiltrates versus atelectasis at the bases  CT chest: Ordered and reviewed by me and shows no evidence of pneumonia but patient does have bronchitis as  well as a lung nodule that will need follow-up ECG/medicine tests: ordered and independent interpretation performed. Decision-making details documented in ED Course.    Details: Ordered and interpreted by me in the absence of cardiology and shows sinus tachycardia, no STEMI or acute change when compared to prior EKG Discussion of management or test interpretation with external provider(s): Discussion with on-call family medicine resident patient to be admitted for further workup and management.  Risk OTC drugs. Prescription drug management. Drug therapy requiring intensive monitoring for toxicity. Decision regarding hospitalization. Diagnosis or treatment significantly limited by social determinants of health. Risk Details: CRITICAL CARE Performed by: Duwaine LITTIE Fusi   Total critical care time: 32 minutes  Critical care time was exclusive of separately billable procedures and treating other patients.  Critical care was necessary to treat or prevent imminent or life-threatening deterioration.  Critical care was time spent personally by me on the following activities: development of treatment plan with patient and/or surrogate as well as nursing, discussions with consultants, evaluation of patient's response to treatment, examination of patient, obtaining history from patient or surrogate, ordering and performing treatments and interventions, ordering and review of laboratory studies, ordering and review of radiographic studies, pulse oximetry and re-evaluation of patient's condition.     Final diagnoses:  COPD exacerbation (HCC)  Bronchitis  Acute hypoxic respiratory failure (HCC)  Tachycardia    ED Discharge Orders          Ordered    predniSONE  (DELTASONE ) 10 MG tablet  Daily        04/25/24 1529    azithromycin  (ZITHROMAX ) 250 MG tablet  Daily        04/25/24 1529               Rayson Rando, Duwaine LITTIE, DO 04/25/24 1538    Anthonee Gelin L, DO 04/25/24 1617

## 2024-04-25 NOTE — H&P (Cosign Needed Addendum)
 Hospital Admission History and Physical Service Pager: [phone]  Patient name: Manuel Leonard Medical record number: [id number] Date of Birth: 12/17/1950 Age: 73 y.o. Gender: male  Primary Care Provider: Cindy Clotilda HERO, DO Consultants: None Code Status: Full Preferred Emergency Contact:   Contact Information     Name Relation Home Work Mobile   [place] Spouse [phone]  [phone]   Ezell, Poke Daughter   [phone]      Other Contacts   None on File     Chief Complaint: Shortness of breath  Differential and Medical Decision Making:  Manuel Leonard is a 73 y.o. male presenting with shortness of breath. Differential for this patient's presentation of this includes COPD exacerbation (most likely given increasing cough, wheezing, oxygen  requirement), viral URI (could have been due to COPD exacerbation, though severity of presentation likely simply URI), pneumonia (less likely due to lack of focal findings on exam, afebrile nature, and imaging findings), and PE (he is tachycardic and hypoxic on room air; however, he is very tight on exam with audible wheezes which is more likely because of his presentation). Assessment & Plan COPD exacerbation (HCC) Unclear trigger.  Stable on 3 L nasal cannula.  Tachycardia likely in the setting of illness and possible mild hypovolemia. Will admit for observation on treatment. -Continue to wean O2 as tolerated -Negative for flu and COVID, will collect full viral panel -Continue Rocephin  (total 5 days, 9/1-), azithromycin  (total 3 days, 9/1-) -Status post 1 dose of methylprednisolone , continue prednisone  40 mg daily (total 5-day course, 9/2-) -DuoNebs every 4 scheduled, restart home Trelegy as able -500 mL lactated ringer  bolus, continue good p.o. intake, consider further fluids -AM CBC, BMP Diabetes mellitus (HCC) Previous A1c 9.1.  Will repeat. -Start moderate sliding scale insulin  given he is  insulin  nave outpatient and adjust as needed with his dosing of steroids -Hold metformin , Trulicity while inpatient  Chronic and Stable Conditions: OSA-ordered CPAP HLD-ordered Crestor  History of DVT-Lovenox  as below Thrombocytopenia-chronic, monitor CBC as above  FEN/GI: Heart healthy VTE Prophylaxis: Lovenox   Disposition: Med/tele  History of Present Illness:  Manuel Leonard is a 73 y.o. male presenting with shortness of breath.  He has been having shortness of breath with wheezing and increasing cough last couple days.  He is very commonly diaphoretic, so this is not new for him.  He has a history of pneumonia and COPD exacerbation.  He has continued to take his inhalers without issue.  No sick contacts.  No fevers.  No nausea, vomiting.  He has had diarrhea, though this has been going on for a while now.  He is supposed to be on 2 L nasal cannula at home but has not been wearing it.  He was sent here from urgent care earlier today due to their concern for pneumonia.  In the ED, he was found to be hypoxic to 84% on room air with tachypnea that improved with 4 L of O2.  He was given IV fluids, Solu-Medrol , DuoNeb.  Chest x-ray and CT scan with evidence of bronchitis.  He was given Rocephin  and azithromycin  and was about to be discharged though became diaphoretic, tachycardic, and gray.  He was called for observation.  Review Of Systems: Per HPI.  Pertinent Past Medical History: COPD T2DM GERD HLD Hypertension Tobacco use Cholelithiasis History of DVT Morbid obesity OSA Remainder reviewed in history tab.   Pertinent Past Surgical History: Appendectomy Hemorrhoidectomy Hernia repair Right index finger partial amputation Tonsillectomy Remainder reviewed in history tab.  Pertinent Social History: Tobacco use: Yes, quit in 2022, total 96 pack years Alcohol use: Socially, wine or beer Other Substance use: None Lives with wife.  Daughter at bedside.  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  They speak [place].  Pertinent Family History: Father-cancer Brother-lung cancer Brother-stomach cancer  Important Outpatient Medications: Albuterol  inhaler 2 puffs every 6 as needed wheezing Aspir 81 milligrams daily Trelegy 2 puffs daily Ibuprofen  6 or milligrams every 6 as needed pain DuoNebs 3 mL every 6 as needed for shortness of breath or wheezing Metformin  5 mg twice daily Crestor  20 mg daily Trulicity 0.75 mg weekly  Objective: BP 116/70   Pulse (!) 109   Temp 98.4 F (36.9 C) (Oral)   Resp 17   SpO2 96%  Exam: General: Sitting up in bed, no acute distress, diaphoretic Eyes: EOM grossly intact ENTM: External ears normal, neck range of motion grossly normal Cardiovascular: Difficult to auscultate due to body habitus Respiratory: While difficult to auscultate due to body habitus, breath sounds decreased posteriorly with end-respiratory faint wheezes Gastrointestinal: Soft, protuberant abdomen, hyperactive bowel sounds, nontender MSK: Moves all extremities grossly equally Neuro: No gross focal deficit Psych: Appropriate mood and affect  Labs:  CBC BMET  Recent Labs  Lab 04/25/24 1243  WBC 10.5  HGB 14.9  HCT 47.5  PLT 145*   Recent Labs  Lab 04/25/24 1243  NA 138  K 4.3  CL 96*  CO2 29  BUN 19  CREATININE 1.48*  GLUCOSE 219*  CALCIUM  8.8*     Troponin 11> 10 BNP 26.5 Negative respiratory panel  EKG: My own interpretation (not copied from electronic read): Sinus tachycardia rate 118 bpm, left anterior fascicular block, not significantly changed from prior  Imaging Studies Performed:  CXR IMPRESSION: Low lung volumes with atelectasis or infiltrate at the lung bases.  CT chest wo contrast IMPRESSION: 1. Diffuse bronchial wall thickening with mild mucous plugging within the bilateral lower lobes. Findings suggestive of infection/inflammation. 2. 1.1 cm mediastinal lymph node likely reactive in etiology. No gross  hilar adenopathy, noting limited sensitivity for the detection of hilar adenopathy on this noncontrast study. Recommend attention on follow-up. 3. Right upper lobe subpleural 1.5 x 1 cm ground-glass airspace opacity. Initial follow-up with CT at 6 months is recommended to confirm persistence. If persistent, repeat CT is recommended every 2 years until 5 years of stability has been established. This recommendation follows the consensus statement: Guidelines for Management of Incidental Pulmonary Nodules Detected on CT Images: From the Fleischner Society 2017; Radiology 2017; 284:228-243. 4. Cholelithiasis. 5.  Aortic Atherosclerosis (ICD10-I70.0).  Tharon Lung, MD 04/25/2024, 5:38 PM PGY-3, [facility] Family Medicine  FPTS Intern pager: [phone], text pages welcome Secure chat group [facility] Teaching Service

## 2024-04-25 NOTE — ED Triage Notes (Signed)
 Pt bibems from [facility]. Noncompliant with home oxygen  for about 2 weeks. 85% RM at UC, 92% on 4L [place]

## 2024-04-25 NOTE — ED Notes (Signed)
 X-ray at bedside.

## 2024-04-25 NOTE — ED Notes (Addendum)
 Called by CCMD to check patient for HR 140-150s. Upon entering room found patient diaphoretic, pale and slightly gray in color. Patient alert and oriented, denies pain and denies sob. Placed back on monitor and MD aware

## 2024-04-25 NOTE — Assessment & Plan Note (Addendum)
 Unclear trigger.  Stable on 3 L nasal cannula.  Tachycardia likely in the setting of illness and possible mild hypovolemia. Will admit for observation on treatment. -Continue to wean O2 as tolerated -Negative for flu and COVID, will collect full viral panel -Continue Rocephin  (total 5 days, 9/1-), azithromycin  (total 3 days, 9/1-) -Status post 1 dose of methylprednisolone , continue prednisone  40 mg daily (total 5-day course, 9/2-) -DuoNebs every 4 scheduled, restart home Trelegy as able -500 mL lactated ringer  bolus, continue good p.o. intake, consider further fluids -AM CBC, BMP

## 2024-04-25 NOTE — Discharge Instructions (Signed)
 Dear Cyntha Jules,   Thank you for letting us  participate in your care! In this section, you will find a brief hospital admission summary of why you were admitted to the hospital, what happened during your admission, your diagnosis/diagnoses, and recommended follow up.   You were diagnosed with a COPD exacerbation and received antibiotics, steroids and breathing treatment to help with your symptoms. Please utilize your home oxygen  as prescribed by your doctor.  You can complete your antibiotic course with 2 more days of Augmentin , please take 1 tablet twice a day for 2 more days. You will finish this on 9/5.   You will also need to complete your steroid course for 2 more days with Prednisone , please take 2 tablets once a day for 2 days, you will finish this on 9/5.   POST-HOSPITAL & CARE INSTRUCTIONS We recommend following up with your PCP within 1 week from being discharged from the hospital. Please let PCP/Specialists know of any changes in medications that were made which you will be able to see in the medications section of this packet.   Thank you for choosing [facility]! Take care and be well!  Family Medicine Teaching Service Inpatient Team [place]  [facility]  [address] [place], [place] [address] [phone]

## 2024-04-25 NOTE — Plan of Care (Signed)
 FMTS Interim Progress Note  S: Patient seen on overnight rounds resting comfortably. He states his breathing is better than it was before. He denies any chest pain or any other pain at this time. He is supposed to be on 2 L oxygen  at home, however, he uses it very seldomly. When asked why he does not use his oxygen  at home he says because he is lazy.     O: BP 133/80   Pulse (!) 106   Temp 98.3 F (36.8 C) (Oral)   Resp 17   SpO2 92%   General: NAD, diaphoretic Cardiovascular: RRR, no m/r/g Respiratory: faint end expiratory wheezes posteriorly, normal work of breathing on 3.5 L nasal cannula  Abdomen: obese, soft, non-tender to palpation  Extremities: 1+ pitting edema bilaterally   A/P: COPD Exacerbation  - wean O2 as tolerated - continue management per day team   Diabetes mellitus - Moderate SSI  - continue management per day team  Lennie Raguel MATSU, DO 04/25/2024, 10:25 PM PGY-1, [facility] Family Medicine Service pager [phone]

## 2024-04-25 NOTE — ED Notes (Signed)
 Pt transported to CT ?

## 2024-04-25 NOTE — Hospital Course (Signed)
 Manuel Leonard is a 73 y.o. male who was admitted to the [facility] Medicine Teaching Service at [facility] for COPD exacerbation. Hospital course is outlined below by problem:  COPD exacerbation Presenting with dyspnea, increased cough and wheezing most consistent with acute COPD exacerbation. CXR without focal findings. Negative for Flu/COVID, RVP. Patient received antibiotics, steroids, and breathing treatments as needed with improvement of symptoms. Patient prescribed 2L O2 at home but rarely uses, remained stable on 3-2L during admission, weaned to 2 by discharge. Discharged with remaining 3 days of prednisone  40 mg, 3 more days of ceftriaxone  1 g, 1 more dose of azithromycin  100 mg tomorrow.   Other conditions that were chronic and stable: T2DM, OSA, HLD, Hx DVT, Thrombocytopenia   Issues for follow up: Right upper lobe subpleural 1.5 x 1 cm ground-glass airspace opacity. Initial follow-up with CT at 6 months is recommended to confirm persistence. If persistent, repeat CT is recommended every 2 years until 5 years of stability has been established.   Encourage adherence to supplemental O2 at home

## 2024-04-26 DIAGNOSIS — Z801 Family history of malignant neoplasm of trachea, bronchus and lung: Secondary | ICD-10-CM | POA: Diagnosis not present

## 2024-04-26 DIAGNOSIS — J9621 Acute and chronic respiratory failure with hypoxia: Secondary | ICD-10-CM | POA: Diagnosis present

## 2024-04-26 DIAGNOSIS — Z91199 Patient's noncompliance with other medical treatment and regimen due to unspecified reason: Secondary | ICD-10-CM | POA: Diagnosis not present

## 2024-04-26 DIAGNOSIS — Z809 Family history of malignant neoplasm, unspecified: Secondary | ICD-10-CM | POA: Diagnosis not present

## 2024-04-26 DIAGNOSIS — E1165 Type 2 diabetes mellitus with hyperglycemia: Secondary | ICD-10-CM

## 2024-04-26 DIAGNOSIS — R000 Tachycardia, unspecified: Secondary | ICD-10-CM | POA: Diagnosis present

## 2024-04-26 DIAGNOSIS — J441 Chronic obstructive pulmonary disease with (acute) exacerbation: Secondary | ICD-10-CM

## 2024-04-26 DIAGNOSIS — Z89021 Acquired absence of right finger(s): Secondary | ICD-10-CM | POA: Diagnosis not present

## 2024-04-26 DIAGNOSIS — Z7951 Long term (current) use of inhaled steroids: Secondary | ICD-10-CM | POA: Diagnosis not present

## 2024-04-26 DIAGNOSIS — I10 Essential (primary) hypertension: Secondary | ICD-10-CM | POA: Diagnosis present

## 2024-04-26 DIAGNOSIS — Z79899 Other long term (current) drug therapy: Secondary | ICD-10-CM | POA: Diagnosis not present

## 2024-04-26 DIAGNOSIS — Z86718 Personal history of other venous thrombosis and embolism: Secondary | ICD-10-CM | POA: Diagnosis not present

## 2024-04-26 DIAGNOSIS — G4733 Obstructive sleep apnea (adult) (pediatric): Secondary | ICD-10-CM | POA: Diagnosis present

## 2024-04-26 DIAGNOSIS — Z1152 Encounter for screening for COVID-19: Secondary | ICD-10-CM | POA: Diagnosis not present

## 2024-04-26 DIAGNOSIS — Z7984 Long term (current) use of oral hypoglycemic drugs: Secondary | ICD-10-CM | POA: Diagnosis not present

## 2024-04-26 DIAGNOSIS — Z9981 Dependence on supplemental oxygen: Secondary | ICD-10-CM | POA: Diagnosis not present

## 2024-04-26 DIAGNOSIS — Z7985 Long-term (current) use of injectable non-insulin antidiabetic drugs: Secondary | ICD-10-CM | POA: Diagnosis not present

## 2024-04-26 DIAGNOSIS — Z7982 Long term (current) use of aspirin: Secondary | ICD-10-CM | POA: Diagnosis not present

## 2024-04-26 DIAGNOSIS — E785 Hyperlipidemia, unspecified: Secondary | ICD-10-CM | POA: Diagnosis present

## 2024-04-26 DIAGNOSIS — D696 Thrombocytopenia, unspecified: Secondary | ICD-10-CM | POA: Diagnosis present

## 2024-04-26 DIAGNOSIS — Z800 Family history of malignant neoplasm of digestive organs: Secondary | ICD-10-CM | POA: Diagnosis not present

## 2024-04-26 DIAGNOSIS — Z87891 Personal history of nicotine dependence: Secondary | ICD-10-CM | POA: Diagnosis not present

## 2024-04-26 DIAGNOSIS — Z8701 Personal history of pneumonia (recurrent): Secondary | ICD-10-CM | POA: Diagnosis not present

## 2024-04-26 LAB — BASIC METABOLIC PANEL WITH GFR
Anion gap: 15 (ref 5–15)
BUN: 27 mg/dL — ABNORMAL HIGH (ref 8–23)
CO2: 28 mmol/L (ref 22–32)
Calcium: 9.1 mg/dL (ref 8.9–10.3)
Chloride: 98 mmol/L (ref 98–111)
Creatinine, Ser: 1.41 mg/dL — ABNORMAL HIGH (ref 0.61–1.24)
GFR, Estimated: 53 mL/min — ABNORMAL LOW (ref >60)
Glucose, Bld: 350 mg/dL — ABNORMAL HIGH (ref 70–99)
Potassium: 4.9 mmol/L (ref 3.5–5.1)
Sodium: 141 mmol/L (ref 135–145)

## 2024-04-26 LAB — RESPIRATORY PANEL BY PCR

## 2024-04-26 LAB — CBC WITH DIFFERENTIAL/PLATELET
Abs Immature Granulocytes: 0.05 K/uL (ref 0.00–0.07)
Basophils Absolute: 0 K/uL (ref 0.0–0.1)
Basophils Relative: 1 %
Eosinophils Absolute: 0 K/uL (ref 0.0–0.5)
Eosinophils Relative: 0 %
HCT: 47.1 % (ref 39.0–52.0)
Hemoglobin: 14.6 g/dL (ref 13.0–17.0)
Immature Granulocytes: 1 %
Lymphocytes Relative: 14 %
Lymphs Abs: 1.1 K/uL (ref 0.7–4.0)
MCH: 27.9 pg (ref 26.0–34.0)
MCHC: 31 g/dL (ref 30.0–36.0)
MCV: 89.9 fL (ref 80.0–100.0)
Monocytes Absolute: 0.2 K/uL (ref 0.1–1.0)
Monocytes Relative: 2 %
Neutro Abs: 6.3 K/uL (ref 1.7–7.7)
Neutrophils Relative %: 82 %
Platelets: 150 K/uL (ref 150–400)
RBC: 5.24 MIL/uL (ref 4.22–5.81)
RDW: 14.3 % (ref 11.5–15.5)
WBC: 7.7 K/uL (ref 4.0–10.5)
nRBC: 0 % (ref 0.0–0.2)

## 2024-04-26 LAB — GLUCOSE, CAPILLARY
Glucose-Capillary: 310 mg/dL — ABNORMAL HIGH (ref 70–99)
Glucose-Capillary: 380 mg/dL — ABNORMAL HIGH (ref 70–99)
Glucose-Capillary: 290 mg/dL — ABNORMAL HIGH (ref 70–99)
Glucose-Capillary: 325 mg/dL — ABNORMAL HIGH (ref 70–99)

## 2024-04-26 MED ORDER — INSULIN GLARGINE 100 UNIT/ML ~~LOC~~ SOLN: 10.0000 [IU] | SUBCUTANEOUS | Status: DC

## 2024-04-26 MED ORDER — INSULIN ASPART 100 UNIT/ML IJ SOLN
10.0000 [IU] | Freq: Once | INTRAMUSCULAR | Status: AC
  Administered 2024-04-27: 10 [IU] via SUBCUTANEOUS

## 2024-04-26 MED ORDER — BUDESON-GLYCOPYRROL-FORMOTEROL 160-9-4.8 MCG/ACT IN AERO
2.0000 | INHALATION_SPRAY | Freq: Two times a day (BID) | RESPIRATORY_TRACT | Status: DC
  Filled 2024-04-26: qty 5.9

## 2024-04-26 MED ORDER — TAMSULOSIN HCL 0.4 MG PO CAPS
0.4000 mg | ORAL_CAPSULE | Freq: Every day | ORAL | Status: DC
  Administered 2024-04-27: 0.4 mg via ORAL
  Filled 2024-04-26: qty 1

## 2024-04-26 MED ORDER — INSULIN GLARGINE 100 UNIT/ML ~~LOC~~ SOLN
10.0000 [IU] | SUBCUTANEOUS | Status: DC
  Administered 2024-04-27: 10 [IU] via SUBCUTANEOUS
  Filled 2024-04-26: qty 0.1

## 2024-04-26 MED ORDER — INSULIN GLARGINE 100 UNIT/ML ~~LOC~~ SOLN
10.0000 [IU] | Freq: Once | SUBCUTANEOUS | Status: AC
  Administered 2024-04-26: 10 [IU] via SUBCUTANEOUS
  Filled 2024-04-26 (×2): qty 0.1

## 2024-04-26 NOTE — Progress Notes (Signed)
 Mobility Specialist: Progress Note   04/26/24 1600  Mobility  Activity Ambulated independently  Level of Assistance Modified independent, requires aide device or extra time  Assistive Device Four wheel walker  Distance Ambulated (ft) 600 ft  Activity Response Tolerated well  Mobility Referral Yes  Mobility visit 1 Mobility  Mobility Specialist Start Time (ACUTE ONLY) 1452  Mobility Specialist Stop Time (ACUTE ONLY) 1512  Mobility Specialist Time Calculation (min) (ACUTE ONLY) 20 min    Pt received in bed, agreeable to mobility session. ModI throughout. SpO2 91-96% on 3LO2. Took 3x seated rest breaks d/t fatigue and a little dizziness. Returned to room without fault. Left on EOB with all needs met, call bell in reach.   Ileana Lute Mobility Specialist Please contact via SecureChat or Rehab office at [phone]

## 2024-04-26 NOTE — Plan of Care (Signed)

## 2024-04-26 NOTE — Assessment & Plan Note (Addendum)
 Previous A1c 9.1. BG has been in 300s. Gave 1 dose of Lantus  this morning  - CBG 4X daily  - Starting Lantus  10 units daily tomorrow - Continue 0-15 units sliding scale novoLOG  insulin  given he is insulin  nave outpatient and adjust as needed.   - Hold metformin , Trulicity while inpatient

## 2024-04-26 NOTE — Progress Notes (Signed)
 Transition of Care [facility]) - Inpatient Brief Assessment   Patient Details  Name: Manuel Leonard MRN: [id number] Date of Birth: 30-Apr-1951  Transition of Care [facility]) CM/SW Contact:    Rosaline JONELLE [name], RN Phone Number: 04/26/2024, 11:37 AM   Clinical Narrative: CM met with the patient at the bedside to discuss TOC needs.  Patient lives with spouse at the home and has home oxygen  (patient bought home oxygen  equipment), rolling walker with seat and nebulizer machine.  Patient does not smoke and quit 3 years ago.  Patient declines need for home health when offered.  Patient's wife is at the bedside and patient states that when he is stable to discharge - daughter to provide transportation to home by car.  No IP Care management needs before discharge to home.   Transition of Care Asessment: Insurance and Status: (P) Insurance coverage has been reviewed Patient has primary care physician: (P) Yes Home environment has been reviewed: (P) from home with spouse Prior level of function:: (P) self Prior/Current Home Services: (P) Current home services (Patient has DME at home including RW with seat, home oxygen  at 2L/min [place] - patient bought the equipment, Nebulizer.) Social Drivers of Health Review: (P) SDOH reviewed interventions complete Readmission risk has been reviewed: (P) Yes Transition of care needs: (P) transition of care needs identified, TOC will continue to follow

## 2024-04-26 NOTE — Evaluation (Signed)
 Physical Therapy Evaluation Patient Details Name: Manuel Leonard MRN: [id number] DOB: 1951/02/03 Today's Date: 04/26/2024  History of Present Illness  Manuel Leonard is a 73 y.o. male presenting with shortness of breath. PMH: DM II, COPD, HTN, HLD, OSA, morbid obesity  Clinical Impression  Pt admitted with above. PTA pt lives with wife in 2 story home however stays on first floor. Pt reports not using AD but doesn't do much at home and is aware It is bad I don't exercise. Pt used rollator for ambulation which allowed pt to ambulate further distances compared to without AD. SpO2 at 92% on 3lO2 via [place] during amb. Pt with DOE 3/4 during ambulation however tolerable with rollator vs no AD. Acute PT to follow while in hospital in addition to having mobility team work with patient however I dont anticipate pt to need follow up PT post d/c.        If plan is discharge home, recommend the following:     Can travel by private vehicle        Equipment Recommendations Rollator (4 wheels) (wide)  Recommendations for Other Services       Functional Status Assessment Patient has had a recent decline in their functional status and demonstrates the ability to make significant improvements in function in a reasonable and predictable amount of time.     Precautions / Restrictions Precautions Precautions: Fall Precaution/Restrictions Comments: watch SPO2 Restrictions Weight Bearing Restrictions Per Provider Order: No      Mobility  Bed Mobility Overal bed mobility: Needs Assistance Bed Mobility: Supine to Sit     Supine to sit: Mod assist     General bed mobility comments: pt's wife pulls pt up from [facility] elevated    Transfers Overall transfer level: Needs assistance Equipment used: Rollator (4 wheels) Transfers: Sit to/from Stand Sit to Stand: Contact guard assist           General transfer comment: verbal cues to push up from bed not pull up from rollator     Ambulation/Gait Ambulation/Gait assistance: Contact guard assist Gait Distance (Feet): 150 Feet Assistive device: Rollator (4 wheels) Gait Pattern/deviations: Step-through pattern Gait velocity: wfl     General Gait Details: noed DOE, SpO2 at 92% on 3LO2 via [place], 2 standing rest breaks  Stairs            Wheelchair Mobility     Tilt Bed    Modified Rankin (Stroke Patients Only)       Balance Overall balance assessment: Mild deficits observed, not formally tested                                           Pertinent Vitals/Pain Pain Assessment Pain Assessment: No/denies pain    Home Living Family/patient expects to be discharged to:: Private residence Living Arrangements: Spouse/significant other Available Help at Discharge: Family;Available 24 hours/day Type of Home: House Home Access: Stairs to enter Entrance Stairs-Rails: Right Entrance Stairs-Number of Steps: 2 Alternate Level Stairs-Number of Steps: doesn't go upstairs Home Layout: Two level;Able to live on main level with bedroom/bathroom Home Equipment: Rollator (4 wheels)      Prior Function Prior Level of Function : Independent/Modified Independent             Mobility Comments: reports not doing much at home, doesn't exercises ADLs Comments: indep     Extremity/Trunk Assessment   Upper Extremity  Assessment Upper Extremity Assessment: Overall WFL for tasks assessed    Lower Extremity Assessment Lower Extremity Assessment: Overall WFL for tasks assessed    Cervical / Trunk Assessment Cervical / Trunk Assessment: Other exceptions Cervical / Trunk Exceptions: morbid obesity  Communication   Communication Communication: Impaired Factors Affecting Communication: Non - English speaking, interpreter not available (speaks english but [place] primary language)    Cognition Arousal: Alert Behavior During Therapy: WFL for tasks assessed/performed   PT - Cognitive  impairments: No apparent impairments                         Following commands: Intact       Cueing Cueing Techniques: Verbal cues, Gestural cues     General Comments General comments (skin integrity, edema, etc.): HR 99 at rest, SPO2 at 94% on 3LO2 at rest, SpO2 at 92% on 3LO2 during amb, HR 124bpm    Exercises     Assessment/Plan    PT Assessment Patient needs continued PT services  PT Problem List Decreased activity tolerance;Decreased balance;Decreased mobility;Decreased knowledge of use of DME       PT Treatment Interventions DME instruction;Stair training;Gait training;Functional mobility training;Therapeutic activities;Therapeutic exercise;Balance training    PT Goals (Current goals can be found in the Care Plan section)  Acute Rehab PT Goals Patient Stated Goal: home today PT Goal Formulation: With patient Time For Goal Achievement: 05/10/24 Potential to Achieve Goals: Good    Frequency Min 2X/week     Co-evaluation               AM-PAC PT 6 Clicks Mobility  Outcome Measure Help needed turning from your back to your side while in a flat bed without using bedrails?: A Little Help needed moving from lying on your back to sitting on the side of a flat bed without using bedrails?: A Little Help needed moving to and from a bed to a chair (including a wheelchair)?: None Help needed standing up from a chair using your arms (e.g., wheelchair or bedside chair)?: None Help needed to walk in hospital room?: A Little Help needed climbing 3-5 steps with a railing? : A Little 6 Click Score: 20    End of Session Equipment Utilized During Treatment: Oxygen  (3LO2 via [place]) Activity Tolerance: Patient tolerated treatment well Patient left: in bed;with call bell/phone within reach;with nursing/sitter in room;with family/visitor present (sitting EOB) Nurse Communication: Mobility status PT Visit Diagnosis: Unsteadiness on feet (R26.81)    Time:  9255-9179 PT Time Calculation (min) (ACUTE ONLY): 36 min   Charges:   PT Evaluation $PT Eval Low Complexity: 1 Low PT Treatments $Gait Training: 8-22 mins PT General Charges $$ ACUTE PT VISIT: 1 Visit         Norene Ames, PT, DPT Acute Rehabilitation Services Secure chat preferred Office #: [phone]   Norene CHRISTELLA Ames 04/26/2024, 8:33 AM

## 2024-04-26 NOTE — Progress Notes (Signed)
 Daily Progress Note Intern Pager: [phone]  Patient name: Manuel Leonard Medical record number: [id number] Date of birth: [birth year] Age: 73 y.o. Gender: male  Primary Care Provider: Cindy Clotilda HERO, DO Consultants: None Code Status: Full   Pt Overview and Major Events to Date:  09/01 : Admitted for COPD exacerbation  Assessment & Plan COPD exacerbation (HCC) SOB improved this morning. No muscle work breathing on 3L. Negative for flu and COVIDs on admission - Continue to wean O2 as tolerated to baseline at home of 2L - Continue ABX   Ceftriaxone  1 g IV Daily for total of 5 days  (9/1- 9/5)   Azithromycin  500 mg PO daily total of 3 days (9/1- 9/3) - Completed Methylprednisolone  125 mg/2ml once.  - Continue 5 days prednisone  40 mg daily with breakfast (9/2- 9/6) - Continue DuoNebs Q4H scheduled  Diabetes mellitus (HCC) Previous A1c 9.1. BG has been in 300s. Gave 1 dose of Lantus  this morning  - CBG 4X daily  - Starting Lantus  10 units daily tomorrow - Continue 0-15 units sliding scale novoLOG  insulin  given he is insulin  nave outpatient and adjust as needed.   - Hold metformin , Trulicity while inpatient   FEN/GI: Heart Healthy diet PPx: SCD Dispo:Pending PT recommendations  in 2-3 days.   Subjective:  Doing well this morning. His wife is at bedside. Pt still required 3L [place] -2L baseline.   Objective: Temp:  [97.5 F (36.4 C)-98.4 F (36.9 C)] 98.1 F (36.7 C) (09/02 0813) Pulse Rate:  [96-118] 102 (09/02 0824) Resp:  [17-26] 20 (09/02 0824) BP: (116-153)/(68-115) 123/78 (09/02 0844) SpO2:  [91 %-97 %] 93 % (09/02 0824) FiO2 (%):  [32 %] 32 % (09/02 0824)  Physical Exam Cardiovascular:     Rate and Rhythm: Normal rate and regular rhythm.  Pulmonary:     Effort: Pulmonary effort is normal.     Breath sounds: Examination of the left-upper field reveals wheezing and rales. Examination of the right-lower field reveals decreased breath sounds. Examination  of the left-lower field reveals decreased breath sounds. Decreased breath sounds, wheezing and rales present.  Abdominal:     Palpations: Abdomen is soft.  Skin:    Capillary Refill: Capillary refill takes less than 2 seconds.  Neurological:     General: No focal deficit present.     Mental Status: He is alert and oriented to person, place, and time.  Psychiatric:        Mood and Affect: Mood normal.        Behavior: Behavior normal.      Laboratory: Most recent CBC Lab Results  Component Value Date   WBC 7.7 04/26/2024   HGB 14.6 04/26/2024   HCT 47.1 04/26/2024   MCV 89.9 04/26/2024   PLT 150 04/26/2024   Most recent BMP    Latest Ref Rng & Units 04/26/2024    3:41 AM  BMP  Glucose 70 - 99 mg/dL 649   BUN 8 - 23 mg/dL 27   Creatinine 9.38 - 1.24 mg/dL 8.58   Sodium 864 - 854 mmol/L 141   Potassium 3.5 - 5.1 mmol/L 4.9   Chloride 98 - 111 mmol/L 98   CO2 22 - 32 mmol/L 28   Calcium  8.9 - 10.3 mg/dL 9.1      Imaging/Diagnostic Tests: CLINICAL DATA:  cough, sob   EXAM: CT CHEST WITHOUT CONTRAST  IMPRESSION: 1. Diffuse bronchial wall thickening with mild mucous plugging within the bilateral lower lobes. Findings suggestive of  infection/inflammation. 2. 1.1 cm mediastinal lymph node likely reactive in etiology. No gross hilar adenopathy, noting limited sensitivity for the detection of hilar adenopathy on this noncontrast study. Recommend attention on follow-up. 3. Right upper lobe subpleural 1.5 x 1 cm ground-glass airspace opacity. Initial follow-up with CT at 6 months is recommended to confirm persistence. If persistent, repeat CT is recommended every 2 years until 5 years of stability has been established. This recommendation follows the consensus statement: Guidelines for Management of Incidental Pulmonary Nodules Detected on CT Images: From the Fleischner Society 2017; Radiology 2017; 284:228-243. 4. Cholelithiasis. 5.  Aortic Atherosclerosis  (ICD10-I70.0).    Suzen [place] NOVAK, DO 04/26/2024, 9:57 AM  PGY-1, [facility] Family Medicine FPTS Intern pager: [phone], text pages welcome Secure chat group [facility] Teaching Service

## 2024-04-26 NOTE — Assessment & Plan Note (Addendum)
 SOB improved this morning. No muscle work breathing on 3L. Negative for flu and COVIDs on admission - Continue to wean O2 as tolerated to baseline at home of 2L - Continue ABX   Ceftriaxone  1 g IV Daily for total of 5 days  (9/1- 9/5)   Azithromycin  500 mg PO daily total of 3 days (9/1- 9/3) - Completed Methylprednisolone  125 mg/3ml once.  - Continue 5 days prednisone  40 mg daily with breakfast (9/2- 9/6) - Continue DuoNebs Q4H scheduled

## 2024-04-26 NOTE — Inpatient Diabetes Management (Signed)
 Inpatient Diabetes Program Recommendations  AACE/ADA: New Consensus Statement on Inpatient Glycemic Control (2015)  Target Ranges:  Prepandial:   less than 140 mg/dL      Peak postprandial:   less than 180 mg/dL (1-2 hours)      Critically ill patients:  140 - 180 mg/dL   Lab Results  Component Value Date   GLUCAP 380 (H) 04/26/2024   HGBA1C 7.5 (H) 04/25/2024    Review of Glycemic Control  Latest Reference Range & Units 04/25/24 19:59 04/26/24 08:15 04/26/24 12:21  Glucose-Capillary 70 - 99 mg/dL 682 (H) 689 (H) 619 (H)   Diabetes history: DM 2 Outpatient Diabetes medications:  Metformin  500 mg bid Trulicity 0.75 mg weekly Current orders for Inpatient glycemic control:  Novolog  0-15 units tid with meals  Lantus  10 units daily Prednisone  40 mg daily  Inpatient Diabetes Program Recommendations:    Note Lantus  started today while on steroids.  Note that A1C improved. Consider adding Novolog  3-4 units tid with meals (hold if patient eats less than 50% or NPO).   Thanks,  Randall Bullocks, RN, BC-ADM Inpatient Diabetes Coordinator Pager [phone]  (8a-5p)

## 2024-04-26 NOTE — Care Management Obs Status (Signed)
 MEDICARE OBSERVATION STATUS NOTIFICATION   Patient Details  Name: Manuel Leonard MRN: [id number] Date of Birth: 10/17/1950   Medicare Observation Status Notification Given:  Yes  verbally reviewed observation notice with Cyntha Meiers telephonically at [phone].  A copy will be mailed to the patient or given to the patient if still here  Claretta Deed 04/26/2024, 11:07 AM

## 2024-04-26 NOTE — Plan of Care (Addendum)
 Saw patient at bedside, was on 2 L nasal cannula and asymptomatic.  Patient was carrying conversation without getting short of breath, resting comfortably, no dyspnea shortness of breath.  Reports he is feeling good and would like to go home.  Discussed that he would need to finish the oral steroid course at home and gave further education that the Trelegy use meant to be used twice a day regardless of symptoms and to use the Ventolin  inhaler for rescue.  Fairy Amy, MD  Further discussed discharge with patient and he understands that his pharmacy would be closed by the time he gets discharged and he wouldn't be able to likely get his medications till tomorrow.  Provided the option of having him stay overnight and discharge early tomorrow morning and get his medications filled here at [facility] pharmacy, he was pleased with that option and willing to stay overnight.   Kathrine Melena, DO 04/26/24 6:20 PM

## 2024-04-27 ENCOUNTER — Other Ambulatory Visit (HOSPITAL_COMMUNITY): Payer: Self-pay

## 2024-04-27 DIAGNOSIS — J441 Chronic obstructive pulmonary disease with (acute) exacerbation: Secondary | ICD-10-CM | POA: Diagnosis not present

## 2024-04-27 DIAGNOSIS — E1165 Type 2 diabetes mellitus with hyperglycemia: Secondary | ICD-10-CM | POA: Diagnosis not present

## 2024-04-27 LAB — GLUCOSE, CAPILLARY: Glucose-Capillary: 213 mg/dL — ABNORMAL HIGH (ref 70–99)

## 2024-04-27 LAB — BASIC METABOLIC PANEL WITH GFR
Anion gap: 10 (ref 5–15)
BUN: 37 mg/dL — ABNORMAL HIGH (ref 8–23)
CO2: 28 mmol/L (ref 22–32)
Calcium: 9 mg/dL (ref 8.9–10.3)
Chloride: 102 mmol/L (ref 98–111)
Creatinine, Ser: 1.19 mg/dL (ref 0.61–1.24)
GFR, Estimated: >60 mL/min (ref >60)
Glucose, Bld: 256 mg/dL — ABNORMAL HIGH (ref 70–99)
Potassium: 4.7 mmol/L (ref 3.5–5.1)
Sodium: 140 mmol/L (ref 135–145)

## 2024-04-27 MED ORDER — AMOXICILLIN-POT CLAVULANATE 875-125 MG PO TABS
1.0000 | ORAL_TABLET | Freq: Two times a day (BID) | ORAL | 0 refills | Status: AC
  Filled 2024-04-27: qty 4, 2d supply, fill #0

## 2024-04-27 MED ORDER — PREDNISONE 20 MG PO TABS
40.0000 mg | ORAL_TABLET | Freq: Every day | ORAL | 0 refills | Status: AC
  Filled 2024-04-27: qty 4, 2d supply, fill #0

## 2024-04-27 MED ORDER — SODIUM CHLORIDE 0.9 % IV SOLN
1.0000 g | Freq: Every day | INTRAVENOUS | Status: DC
  Administered 2024-04-27: 1 g via INTRAVENOUS
  Filled 2024-04-27: qty 10

## 2024-04-27 NOTE — Discharge Summary (Addendum)
 Family Medicine Teaching [facility] Discharge Summary  Patient name: Manuel Leonard Medical record number: [id number] Date of birth: August 09, 1951 Age: 73 y.o. Gender: male Date of Admission: 04/25/2024  Date of Discharge: 04/27/24 Admitting Physician: Kathrine Melena, DO  Primary Care Provider: Cindy Clotilda HERO, DO Consultants: None  Indication for Hospitalization: COPD exacerbation   Discharge Diagnoses/Problem List:  COPD exacerbation [facility]) Principal Problem for Admission:  Other Problems addressed during stay:  Active Problems:   Diabetes mellitus (HCC)   COPD exacerbation (HCC)   The above problem list has been updated and reviewed for accuracy, including the initial reason for admission.   Brief Hospital Course:  Manuel Leonard is a 73 y.o. male who was admitted to the [facility] Medicine Teaching Service at [facility] for COPD exacerbation.   Hospital course is outlined below by problem:  COPD exacerbation Presenting with dyspnea, increased cough and wheezing most consistent with acute COPD exacerbation. CXR without focal findings and negative viral testing. Patient received antibiotics, steroids, and breathing treatments as needed with improvement of symptoms. Patient prescribed 2L O2 at home but rarely uses, remained stable on 3-2L during admission, weaned to 2 by discharge. Discharged with remaining 2 days of prednisone  40 mg, 2 more days of Augmentin . He has completed Azithromycin  on 04/27/24.   Other conditions that were chronic and stable: T2DM, OSA, HLD, Hx DVT, Thrombocytopenia   Hyperglycemia Blood glucose has been elevated with Prednisone . Long acting insulin  and Short acting sliding scale has prescribed during his hospitalization. Will plan on return to home regimen and f/u with PCP  Issues for follow up: Right upper lobe subpleural 1.5 x 1 cm ground-glass airspace opacity. Initial follow-up with CT at 6 months is recommended to confirm persistence. If  persistent, repeat CT is recommended every 2 years until 5 years of stability has been established.   Encourage adherence to supplemental O2 at home  Monitoring blood glucose. If BG remain elevated he may need his hyperglycemia medication to be adjusted.       Results/Tests Pending at Time of Discharge:  Unresulted Labs (From admission, onward)    None        Disposition: Home  Discharge Condition: Good  Discharge Exam:  Vitals:   04/27/24 0519 04/27/24 0818  BP: 119/79 122/85  Pulse: 75 96  Resp:  18  Temp: 97.8 F (36.6 C)   SpO2:  96%   Physical Exam Cardiovascular:     Rate and Rhythm: Normal rate.  Pulmonary:     Effort: Pulmonary effort is normal.     Breath sounds: Normal breath sounds. Decreased breath sounds: On 2L [place] - baseline.  Abdominal:     Palpations: Abdomen is soft.  Skin:    General: Skin is warm and dry.  Neurological:     General: No focal deficit present.     Mental Status: He is oriented to person, place, and time.  Psychiatric:        Mood and Affect: Mood normal.        Behavior: Behavior normal.     Significant Procedures: None  Significant Labs and Imaging:  Recent Labs  Lab 04/25/24 1243 04/26/24 0341  WBC 10.5 7.7  HGB 14.9 14.6  HCT 47.5 47.1  PLT 145* 150   Recent Labs  Lab 04/25/24 1243 04/26/24 0341 04/27/24 0247  NA 138 141 140  K 4.3 4.9 4.7  CL 96* 98 102  CO2 29 28 28   GLUCOSE 219* 350* 256*  BUN 19 27*  37*  CREATININE 1.48* 1.41* 1.19  CALCIUM  8.8* 9.1 9.0       Discharge Medications:  Allergies as of 04/27/2024   No Known Allergies   Med Rec must be completed prior to using this SMARTLINK   Discharge Instructions: Please refer to Patient Instructions section of EMR for full details.  Patient was counseled important signs and symptoms that should prompt return to medical care, changes in medications, dietary instructions, activity restrictions, and follow up appointments.   Follow-Up  Appointments:  Follow-up Information     Cindy Clotilda HERO, DO. Schedule an appointment as soon as possible for a visit in 1 week.   Specialty: Family Medicine Contact information: [address] [place] [address] [phone]                 Suzen [place] NOVAK, DO 04/27/2024, 8:44 AM PGY-1, [facility] Family Medicine

## 2024-04-27 NOTE — Progress Notes (Signed)
  Progress Note   Date: 04/27/2024  Patient Name: Manuel Leonard        MRN#: [id number]   Clarification of diagnosis:   Acute on chronic hypoxic respiratory failure resolved

## 2024-04-27 NOTE — Plan of Care (Signed)

## 2024-05-05 DIAGNOSIS — J441 Chronic obstructive pulmonary disease with (acute) exacerbation: Secondary | ICD-10-CM | POA: Diagnosis not present

## 2024-05-05 DIAGNOSIS — J42 Unspecified chronic bronchitis: Secondary | ICD-10-CM | POA: Diagnosis not present

## 2024-05-05 DIAGNOSIS — R9389 Abnormal findings on diagnostic imaging of other specified body structures: Secondary | ICD-10-CM | POA: Diagnosis not present

## 2024-05-05 DIAGNOSIS — E119 Type 2 diabetes mellitus without complications: Secondary | ICD-10-CM | POA: Diagnosis not present

## 2024-05-05 DIAGNOSIS — K5909 Other constipation: Secondary | ICD-10-CM | POA: Diagnosis not present

## 2024-05-19 DIAGNOSIS — K5900 Constipation, unspecified: Secondary | ICD-10-CM | POA: Diagnosis not present

## 2024-05-19 DIAGNOSIS — K648 Other hemorrhoids: Secondary | ICD-10-CM | POA: Diagnosis not present

## 2024-05-19 DIAGNOSIS — K625 Hemorrhage of anus and rectum: Secondary | ICD-10-CM | POA: Diagnosis not present

## 2024-05-19 DIAGNOSIS — K6289 Other specified diseases of anus and rectum: Secondary | ICD-10-CM | POA: Diagnosis not present

## 2024-05-24 DIAGNOSIS — J441 Chronic obstructive pulmonary disease with (acute) exacerbation: Secondary | ICD-10-CM | POA: Diagnosis not present

## 2024-05-24 DIAGNOSIS — N400 Enlarged prostate without lower urinary tract symptoms: Secondary | ICD-10-CM | POA: Diagnosis not present

## 2024-05-24 DIAGNOSIS — E782 Mixed hyperlipidemia: Secondary | ICD-10-CM | POA: Diagnosis not present

## 2024-05-24 DIAGNOSIS — E1165 Type 2 diabetes mellitus with hyperglycemia: Secondary | ICD-10-CM | POA: Diagnosis not present

## 2024-06-17 ENCOUNTER — Telehealth: Payer: Self-pay

## 2024-06-17 DIAGNOSIS — J449 Chronic obstructive pulmonary disease, unspecified: Secondary | ICD-10-CM

## 2024-06-20 DIAGNOSIS — K642 Third degree hemorrhoids: Secondary | ICD-10-CM | POA: Diagnosis not present
